# Patient Record
Sex: Male | Born: 1965 | Race: White | Hispanic: No | Marital: Married | Smoking: Never smoker
Health system: Southern US, Community
[De-identification: ages and names within clinical notes are randomized; demographics above are authoritative.]

## PROBLEM LIST (undated history)

## (undated) DIAGNOSIS — G4730 Sleep apnea, unspecified: Secondary | ICD-10-CM

## (undated) DIAGNOSIS — S0300XA Dislocation of jaw, unspecified side, initial encounter: Secondary | ICD-10-CM

## (undated) DIAGNOSIS — Z923 Personal history of irradiation: Secondary | ICD-10-CM

## (undated) DIAGNOSIS — C099 Malignant neoplasm of tonsil, unspecified: Secondary | ICD-10-CM

## (undated) DIAGNOSIS — T7840XA Allergy, unspecified, initial encounter: Secondary | ICD-10-CM

## (undated) DIAGNOSIS — F988 Other specified behavioral and emotional disorders with onset usually occurring in childhood and adolescence: Secondary | ICD-10-CM

## (undated) DIAGNOSIS — F329 Major depressive disorder, single episode, unspecified: Secondary | ICD-10-CM

## (undated) DIAGNOSIS — M5136 Other intervertebral disc degeneration, lumbar region: Secondary | ICD-10-CM

## (undated) DIAGNOSIS — M51369 Other intervertebral disc degeneration, lumbar region without mention of lumbar back pain or lower extremity pain: Secondary | ICD-10-CM

## (undated) DIAGNOSIS — R5383 Other fatigue: Secondary | ICD-10-CM

## (undated) DIAGNOSIS — C140 Malignant neoplasm of pharynx, unspecified: Secondary | ICD-10-CM

## (undated) DIAGNOSIS — J984 Other disorders of lung: Secondary | ICD-10-CM

## (undated) DIAGNOSIS — F32A Depression, unspecified: Secondary | ICD-10-CM

## (undated) HISTORY — DX: Major depressive disorder, single episode, unspecified: F32.9

## (undated) HISTORY — DX: Personal history of irradiation: Z92.3

## (undated) HISTORY — DX: Other specified behavioral and emotional disorders with onset usually occurring in childhood and adolescence: F98.8

## (undated) HISTORY — DX: Other intervertebral disc degeneration, lumbar region without mention of lumbar back pain or lower extremity pain: M51.369

## (undated) HISTORY — DX: Depression, unspecified: F32.A

## (undated) HISTORY — DX: Other intervertebral disc degeneration, lumbar region: M51.36

## (undated) HISTORY — DX: Other disorders of lung: J98.4

## (undated) HISTORY — DX: Sleep apnea, unspecified: G47.30

## (undated) HISTORY — PX: ANTERIOR RELEASE VERTEBRAL BODY W/ POSTERIOR FUSION: SUR290

## (undated) HISTORY — DX: Malignant neoplasm of tonsil, unspecified: C09.9

## (undated) HISTORY — PX: CHOLECYSTECTOMY: SHX55

## (undated) HISTORY — DX: Other fatigue: R53.83

## (undated) HISTORY — DX: Dislocation of jaw, unspecified side, initial encounter: S03.00XA

## (undated) HISTORY — DX: Allergy, unspecified, initial encounter: T78.40XA

---

## 1997-07-23 ENCOUNTER — Emergency Department (HOSPITAL_COMMUNITY): Admission: EM | Admit: 1997-07-23 | Discharge: 1997-07-23 | Payer: Self-pay | Admitting: Emergency Medicine

## 1997-11-02 ENCOUNTER — Encounter: Admission: RE | Admit: 1997-11-02 | Discharge: 1998-01-31 | Payer: Self-pay | Admitting: Anesthesiology

## 1998-01-08 ENCOUNTER — Encounter: Payer: Self-pay | Admitting: Neurosurgery

## 1998-01-08 ENCOUNTER — Ambulatory Visit (HOSPITAL_COMMUNITY): Admission: RE | Admit: 1998-01-08 | Discharge: 1998-01-08 | Payer: Self-pay | Admitting: Neurosurgery

## 1998-08-19 ENCOUNTER — Encounter: Admission: RE | Admit: 1998-08-19 | Discharge: 1998-11-17 | Payer: Self-pay | Admitting: Anesthesiology

## 1999-05-15 ENCOUNTER — Encounter: Payer: Self-pay | Admitting: Physical Medicine and Rehabilitation

## 1999-05-15 ENCOUNTER — Ambulatory Visit (HOSPITAL_COMMUNITY)
Admission: RE | Admit: 1999-05-15 | Discharge: 1999-05-15 | Payer: Self-pay | Admitting: Physical Medicine and Rehabilitation

## 1999-09-06 HISTORY — PX: BACK SURGERY: SHX140

## 1999-09-11 ENCOUNTER — Encounter: Payer: Self-pay | Admitting: Neurological Surgery

## 1999-09-11 ENCOUNTER — Inpatient Hospital Stay (HOSPITAL_COMMUNITY): Admission: RE | Admit: 1999-09-11 | Discharge: 1999-09-16 | Payer: Self-pay | Admitting: Neurological Surgery

## 1999-10-02 ENCOUNTER — Encounter: Admission: RE | Admit: 1999-10-02 | Discharge: 1999-10-02 | Payer: Self-pay | Admitting: Neurological Surgery

## 1999-10-02 ENCOUNTER — Encounter: Payer: Self-pay | Admitting: Neurological Surgery

## 1999-11-05 ENCOUNTER — Encounter: Admission: RE | Admit: 1999-11-05 | Discharge: 1999-11-05 | Payer: Self-pay | Admitting: Neurological Surgery

## 1999-11-05 ENCOUNTER — Encounter: Payer: Self-pay | Admitting: Neurological Surgery

## 1999-11-17 ENCOUNTER — Encounter: Admission: RE | Admit: 1999-11-17 | Discharge: 2000-02-15 | Payer: Self-pay | Admitting: Neurological Surgery

## 2000-02-16 ENCOUNTER — Encounter: Admission: RE | Admit: 2000-02-16 | Discharge: 2000-05-16 | Payer: Self-pay | Admitting: Neurological Surgery

## 2006-05-20 ENCOUNTER — Ambulatory Visit: Payer: Self-pay

## 2008-01-11 ENCOUNTER — Ambulatory Visit: Payer: Self-pay

## 2009-01-10 ENCOUNTER — Emergency Department (HOSPITAL_COMMUNITY): Admission: EM | Admit: 2009-01-10 | Discharge: 2009-01-11 | Payer: Self-pay | Admitting: Emergency Medicine

## 2010-03-22 LAB — BASIC METABOLIC PANEL
BUN: 9 mg/dL (ref 6–23)
Calcium: 9 mg/dL (ref 8.4–10.5)
GFR calc Af Amer: >60 mL/min (ref >60)
GFR calc non Af Amer: >60 mL/min (ref >60)
Glucose, Bld: 86 mg/dL (ref 70–99)

## 2010-03-22 LAB — CBC
MCHC: 34.5 g/dL (ref 30.0–36.0)
Platelets: 238 10*3/uL (ref 150–400)
RBC: 4.59 MIL/uL (ref 4.22–5.81)
WBC: 7.1 10*3/uL (ref 4.0–10.5)

## 2010-03-22 LAB — DIFFERENTIAL
Basophils Absolute: 0 10*3/uL (ref 0.0–0.1)
Basophils Relative: 1 % (ref 0–1)
Eosinophils Relative: 2 % (ref 0–5)
Lymphocytes Relative: 29 % (ref 12–46)
Monocytes Absolute: 0.5 10*3/uL (ref 0.1–1.0)

## 2010-05-23 NOTE — Discharge Summary (Signed)
[place]. [facility]  Patient:    Kyle Hendrix, Kyle Hendrix                    MRN: [id number] Adm. Date:  [id number] Disc. Date: [id number] Attending:  Jonne Ply                           Discharge Summary  ADMITTING DIAGNOSIS:  L4-5 spondylolisthesis with lumbar radiculopathy.  DISCHARGE DIAGNOSES: 1. L4-5 spondylolisthesis with lumbar radiculopathy. 2. Urinary tract infection.  CONDITION ON DISCHARGE:  Improving.  HOSPITAL COURSE:  The patient is a 45 year old individual who has had a spondylolisthesis at the L4-5 level.  He had a previous diskectomy at L4-5. He was found to have developed a significant slip over the ensuing year, and he was advised to go to surgical decompression and stabilization using pedicle fixation and a posterior interbody arthrodesis with Branigan cages.  The patient was taken to the operating room for this procedure on May 11, 1999. Postoperatively, he did fairly well; however, the patient developed severe problems with constipation and required the use of a Foley catheter for 48 hours.  On the day prior to discharge, he was noted to have significant complaints of discomfort both abdominally and was complaining of having cloudy urination.  Urinalysis was fairly negative; however, culture was sent and the patient was started on an antibiotic for his urinary tract infection that was presumed.  He seemed to improve rather rapidly.  His temperature was 100.5. His incision has remained clean and dry, and at this time he is discharged home with a prescription for four more days of Cipro 500 mg b.i.d., Percocet #60 without refills, and Valium 5 mg #40 without refills. DD:  09/16/99 TD:  09/16/99 Job: [id number]

## 2010-05-23 NOTE — H&P (Signed)
[place]. [facility]  Patient:    Kyle Hendrix, Kyle Hendrix                    MRN: [id number] Adm. Date:  [id number] Attending:  Jonne Ply                         History and Physical  ADMISSION DIAGNOSIS:  Spondylolisthesis L4-5 with lumbar radiculopathy.  HISTORY OF PRESENT ILLNESS:  The patient is a 45 year old right-handed invidual who works as a Naval architect.  About two years ago, he was involved in a work-related incident in September of 1999.  He developed pain in his low back after a fall onto his buttocks.  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  The pain subsequently began to radiate to his lower extremities.  He was treated with aggressive conservative management and followed by Dr. Lelon Perla.  He seemed to do well, however, after returning to the work place, he developed further symptoms of numbness and tingling in his lower extremities and more back pain.  He was seen by Dr. Gean Birchwood.  Ultimately a workup including diskograms were performed at 4-5 demonstrating a spondylolisthesis. Stenosis was also noted on the recent myelogram.  The patient was seen for a second opinion on August 15 and we discussed surgical intervention.  The patient wished to proceed with surgery at that visit and he is now admitted to undergo Gill procedure, decompression followed by stabilization of L4-5 using a posterior interbody technique and pedicle screw fixation.  PAST MEDICAL HISTORY:  The patient has been very healthy.  He does have allergies seasonally and has asthma associated with those allergies.  ALLERGIES:  IODINE, NOVACAINE, ULTRAM, AVELOX also causes allergies.  The patient does not smoke.  He drinks alcohol socially and on rare occasions. Height and weight have been stable at 5 foot 7 inches and 200 pounds.  MEDICATIONS: 1. Zoloft 25 mg a day for mild depression. 2. Darvocet. 3. Celebrex. 4. Flexoril.  FAMILY HISTORY:  His mother is  age 5 and in good health.  Father is age 87 also in good health.  No known medical illnesses.  REVIEW OF SYSTEMS:  Notable for wearing glasses for reading only.  Hay fever. Leg pain while walking.  Leg weakness.  Back pain and leg pain.  PHYSICAL EXAMINATION:  GENERAL:  An alert and oriented, well-developed individual in no acute distress.  Stands straight and erect without difficulty.  Flexion forward is possible to 45 degrees before he complains of significant back pain with radiation to both buttocks.  Palpation across the lumbar spine reproduces exquisite tenderness at the central portion of the beltline overlying the L5 spinous process.  Motor strength in the lower extremities reveals the iliopsoas, quads, tibialis, and gastrocnemius have good strength, tone, and bulk to confrontation.  Deep tendon reflexes are 2+ in the patella, 1+ in the achilles.  Babinskis are downgoing.  Sensation is intact to vibration distally.  Straight leg raising and reproduces primarily back pain to 60 degrees in either lower extremity.  Patricks maneuver is negative bilaterally.  Upper extremity strength and reflexes are normal.  Cranial nerve examination reveals the pupils are 4 mm, brisk, reactive to light and accommodation.  The extraocular movements are full.  Face is symmetric to grimace.  Tongue and uvula are in the midline.  Sclerae and conjunctivae are clear.  VITAL SIGNS:  Blood pressure 128/72, heart  rate 66 and regular, respirations 14 and nonlabored.  NECK:  No masses and no bruits are heard.  LUNGS:  Clear to auscultation.  ABDOMEN:  Soft.  Bowel sounds are positive.  No masses are palpable.  EXTREMITIES:  No clubbing, cyanosis, or edema.  IMPRESSION:  The patient has evidence of degenerative changes of spondylolisthesis at L4-5 and he is admitted to undergo surgical decompression and stabilization. DD:  09/11/99 TD:  09/11/99 Job: [id number]

## 2010-05-23 NOTE — Op Note (Signed)
[place]. [facility]  Patient:    Kyle Hendrix, Kyle Hendrix                    MRN: [id number] Proc. Date: 09/11/99 Adm. Date:  [id number] Attending:  Jonne Ply                           Operative Report  PREOPERATIVE DIAGNOSIS:  L4-5 spondylolisthesis with lumbar radiculopathy.  POSTOPERATIVE DIAGNOSIS:  L4-5 spondylolisthesis with lumbar radiculopathy.  OPERATION PERFORMED:  L4 Gill procedure.  Posterior interbody arthrodesis with Brantigan cage technique pedicle screw fixation and arthrodesis with local autograft.  SURGEON:  Stefani Dama, M.D.  ASSISTANT:  Cristi Loron, M.D.  ANESTHESIA:  General endotracheal  INDICATIONS FOR PROCEDURE:  The patient is a 45 year old individual who has had significant back pain, bilateral radiculopathy.  He has a spondylolisthesis at L4-5.  DESCRIPTION OF PROCEDURE:  The patient was brought to the operating room supine on the stretcher.  After smooth induction of general endotracheal anesthesia, he was turned prone.  The back was shaved, prepped with Hibiclens and then alcohol and then draped in a sterile fashion.  A midline incision was created and carried down to the lumbodorsal fascia which was opened on either side of the midline.  The L4 laminar arch and spinous process was noted to be loose and was identified positively with a radiograph.  Dissection was carried out laterally, so that a Gill procedure so that an en bloc removal of the L4 spinous process and laminar arch was performed.  This bone was then cleaned, morselized and used for local autograft later.  It was mixed with Vitoss bone void filler which was then mixed with some of the patients own blood.  The bone was laid aside for later use.  The decompression was then completed using a combination of curets and rongeurs to remove the redundant yellow ligament which was noted to be markedly thickened at the L4-L5 interspace.  The L5 facets  were normal in appearance.  However, there was a moderate amount of grumous material that had formed around the pars defect.  This was also decompressed.  The L4 nerve root was decompressed superiorly.  The L5 nerve root was decompressed inferiorly. This was done on both sides.  The diskectomy was then performed using a combination of curets and rongeurs to remove a significant amount of degenerated material from within each side of the disk space.  The endplates were then identified, decorticated with a combination of curets and osteotomes specifically designed for this purpose.  A trial cage spacer was placed.  This measured 9 x 11 mm on the right side and seemed to adequately distract the interspace.  Brannigan cages were then placed measuring 9 x 11 mm and being 25 mm in length.  They were packed with the patients local autologous bone and were packed in both sides.  Pedicle screw entry sites were then chosen at L4 and L5.  These were then sounded and 5.75 x 45 mm screws were placed after tapping individual holes.  Each hole was checked for possibility of cut out using a probe.  None was identified.  The screws were then placed to the appropriate height and then an 80 mm straight rod was cut in half and bent to the appropiate contour.  The spiral 90 locking caps were then placed over the screws and the screws were locked into their final  position using a slight bit of compression across the construct.  Once these were locked into position, the patency of the foraminal openings for the L4 and the L5 nerve roots were checked.  A fat graft was placed over the L4 nerve root and region of the axilla after packing the interspace with additional bone and Vitoss mixture and then after placing the fat graft, additional graft was placed laterally from under the pedicular construct. This was done on either side.  Once this was completed, the area was copiously irrigated with antibiotic irrigating  solution.  The retractor was removed from the wound and then the wound was closed using #1 Vicryl in interrupted fashion in the lumbodorsal fascia, 2-0 Vicryl in the subcutaneous tissues and 3-0 Vicryl subcuticularly.  A clear plastic dressing was placed on the back.  The patient tolerated the procedure well.  Blood loss was estimated around 450 cc. One unit of Cell Saver blood was returned to the patient. DD:  09/11/99 TD:  09/13/99 Job: [id number]

## 2011-02-05 ENCOUNTER — Other Ambulatory Visit (HOSPITAL_COMMUNITY)
Admission: RE | Admit: 2011-02-05 | Discharge: 2011-02-05 | Disposition: A | Discharge status: Home / Self Care | Payer: Self-pay | Source: Ambulatory Visit | Attending: Otolaryngology | Admitting: Otolaryngology

## 2011-02-05 ENCOUNTER — Other Ambulatory Visit: Payer: Self-pay | Admitting: Otolaryngology

## 2011-02-05 DIAGNOSIS — C099 Malignant neoplasm of tonsil, unspecified: Secondary | ICD-10-CM

## 2011-02-05 DIAGNOSIS — R220 Localized swelling, mass and lump, head: Secondary | ICD-10-CM | POA: Insufficient documentation

## 2011-02-05 HISTORY — DX: Malignant neoplasm of tonsil, unspecified: C09.9

## 2011-02-16 ENCOUNTER — Other Ambulatory Visit (HOSPITAL_COMMUNITY): Payer: Self-pay | Admitting: Otolaryngology

## 2011-02-16 DIAGNOSIS — C770 Secondary and unspecified malignant neoplasm of lymph nodes of head, face and neck: Secondary | ICD-10-CM

## 2011-02-25 ENCOUNTER — Encounter (HOSPITAL_COMMUNITY)
Admission: RE | Admit: 2011-02-25 | Discharge: 2011-02-25 | Disposition: A | Discharge status: Home / Self Care | Payer: BC Managed Care – PPO | Source: Ambulatory Visit | Attending: Otolaryngology | Admitting: Otolaryngology

## 2011-02-25 ENCOUNTER — Other Ambulatory Visit (HOSPITAL_COMMUNITY): Payer: Self-pay | Admitting: Otolaryngology

## 2011-02-25 DIAGNOSIS — C770 Secondary and unspecified malignant neoplasm of lymph nodes of head, face and neck: Secondary | ICD-10-CM

## 2011-02-25 DIAGNOSIS — N200 Calculus of kidney: Secondary | ICD-10-CM | POA: Insufficient documentation

## 2011-02-25 DIAGNOSIS — R599 Enlarged lymph nodes, unspecified: Secondary | ICD-10-CM | POA: Insufficient documentation

## 2011-02-25 DIAGNOSIS — D490 Neoplasm of unspecified behavior of digestive system: Secondary | ICD-10-CM | POA: Insufficient documentation

## 2011-02-25 DIAGNOSIS — K7689 Other specified diseases of liver: Secondary | ICD-10-CM | POA: Insufficient documentation

## 2011-02-25 LAB — GLUCOSE, CAPILLARY: Glucose-Capillary: 101 mg/dL — ABNORMAL HIGH (ref 70–99)

## 2011-02-25 MED ORDER — FLUDEOXYGLUCOSE F - 18 (FDG) INJECTION
17.2000 | Freq: Once | INTRAVENOUS | Status: AC | PRN
  Administered 2011-02-25: 17.2 via INTRAVENOUS

## 2011-03-02 ENCOUNTER — Telehealth: Payer: Self-pay | Admitting: Oncology

## 2011-03-02 ENCOUNTER — Other Ambulatory Visit: Payer: Self-pay | Admitting: Oncology

## 2011-03-02 DIAGNOSIS — C099 Malignant neoplasm of tonsil, unspecified: Secondary | ICD-10-CM

## 2011-03-02 NOTE — Telephone Encounter (Signed)
called pt and scheduled appt for 02/27.  will fax over  a letter to Dr. Lazarus Salines with appt d/t

## 2011-03-03 ENCOUNTER — Encounter: Payer: Self-pay | Admitting: *Deleted

## 2011-03-03 ENCOUNTER — Telehealth: Payer: Self-pay | Admitting: Oncology

## 2011-03-03 DIAGNOSIS — F988 Other specified behavioral and emotional disorders with onset usually occurring in childhood and adolescence: Secondary | ICD-10-CM | POA: Insufficient documentation

## 2011-03-03 DIAGNOSIS — G4730 Sleep apnea, unspecified: Secondary | ICD-10-CM | POA: Insufficient documentation

## 2011-03-03 NOTE — Telephone Encounter (Signed)
Referred by Dr. Lazarus Salines Dx- [facility]

## 2011-03-04 ENCOUNTER — Ambulatory Visit (HOSPITAL_BASED_OUTPATIENT_CLINIC_OR_DEPARTMENT_OTHER): Payer: BC Managed Care – PPO | Admitting: Oncology

## 2011-03-04 ENCOUNTER — Encounter: Payer: Self-pay | Admitting: Radiation Oncology

## 2011-03-04 ENCOUNTER — Ambulatory Visit
Admission: RE | Admit: 2011-03-04 | Discharge: 2011-03-04 | Disposition: A | Discharge status: Home / Self Care | Payer: BC Managed Care – PPO | Source: Ambulatory Visit | Attending: Radiation Oncology | Admitting: Radiation Oncology

## 2011-03-04 ENCOUNTER — Ambulatory Visit: Payer: BC Managed Care – PPO

## 2011-03-04 ENCOUNTER — Encounter: Payer: Self-pay | Admitting: *Deleted

## 2011-03-04 ENCOUNTER — Telehealth: Payer: Self-pay | Admitting: Oncology

## 2011-03-04 ENCOUNTER — Other Ambulatory Visit: Payer: BC Managed Care – PPO

## 2011-03-04 ENCOUNTER — Encounter: Payer: Self-pay | Admitting: Oncology

## 2011-03-04 VITALS — BP 121/85 | HR 123 | Temp 97.8°F | Ht 68.0 in | Wt 231.0 lb

## 2011-03-04 DIAGNOSIS — Z79899 Other long term (current) drug therapy: Secondary | ICD-10-CM | POA: Insufficient documentation

## 2011-03-04 DIAGNOSIS — L988 Other specified disorders of the skin and subcutaneous tissue: Secondary | ICD-10-CM | POA: Insufficient documentation

## 2011-03-04 DIAGNOSIS — C099 Malignant neoplasm of tonsil, unspecified: Secondary | ICD-10-CM

## 2011-03-04 DIAGNOSIS — C801 Malignant (primary) neoplasm, unspecified: Secondary | ICD-10-CM

## 2011-03-04 DIAGNOSIS — K121 Other forms of stomatitis: Secondary | ICD-10-CM | POA: Insufficient documentation

## 2011-03-04 DIAGNOSIS — B379 Candidiasis, unspecified: Secondary | ICD-10-CM | POA: Insufficient documentation

## 2011-03-04 DIAGNOSIS — Z87891 Personal history of nicotine dependence: Secondary | ICD-10-CM | POA: Insufficient documentation

## 2011-03-04 DIAGNOSIS — D696 Thrombocytopenia, unspecified: Secondary | ICD-10-CM | POA: Insufficient documentation

## 2011-03-04 DIAGNOSIS — R110 Nausea: Secondary | ICD-10-CM | POA: Insufficient documentation

## 2011-03-04 DIAGNOSIS — D709 Neutropenia, unspecified: Secondary | ICD-10-CM | POA: Insufficient documentation

## 2011-03-04 DIAGNOSIS — Z91041 Radiographic dye allergy status: Secondary | ICD-10-CM | POA: Insufficient documentation

## 2011-03-04 DIAGNOSIS — K1230 Oral mucositis (ulcerative), unspecified: Secondary | ICD-10-CM | POA: Insufficient documentation

## 2011-03-04 DIAGNOSIS — Z510 Encounter for antineoplastic radiation therapy: Secondary | ICD-10-CM | POA: Insufficient documentation

## 2011-03-04 DIAGNOSIS — R599 Enlarged lymph nodes, unspecified: Secondary | ICD-10-CM | POA: Insufficient documentation

## 2011-03-04 DIAGNOSIS — T7840XA Allergy, unspecified, initial encounter: Secondary | ICD-10-CM | POA: Insufficient documentation

## 2011-03-04 DIAGNOSIS — G4730 Sleep apnea, unspecified: Secondary | ICD-10-CM | POA: Insufficient documentation

## 2011-03-04 DIAGNOSIS — B977 Papillomavirus as the cause of diseases classified elsewhere: Secondary | ICD-10-CM | POA: Insufficient documentation

## 2011-03-04 DIAGNOSIS — Z931 Gastrostomy status: Secondary | ICD-10-CM | POA: Insufficient documentation

## 2011-03-04 LAB — CBC WITH DIFFERENTIAL/PLATELET
BASO%: 0.4 % (ref 0.0–2.0)
MCH: 30 pg (ref 27.2–33.4)
MCHC: 34.3 g/dL (ref 32.0–36.0)
MCV: 87.5 fL (ref 79.3–98.0)
RBC: 5.11 10*6/uL (ref 4.20–5.82)
RDW: 12.7 % (ref 11.0–14.6)
lymph#: 2.1 10*3/uL (ref 0.9–3.3)

## 2011-03-04 LAB — COMPREHENSIVE METABOLIC PANEL
ALT: 43 U/L (ref 0–53)
BUN: 12 mg/dL (ref 6–23)
CO2: 25 mEq/L (ref 19–32)
Calcium: 9.7 mg/dL (ref 8.4–10.5)
Chloride: 103 mEq/L (ref 96–112)
Creatinine, Ser: 0.92 mg/dL (ref 0.50–1.35)
Total Bilirubin: 0.6 mg/dL (ref 0.3–1.2)

## 2011-03-04 MED ORDER — LARYNGOSCOPY SOLUTION RAD-ONC
15.0000 mL | Freq: Once | TOPICAL | Status: AC
  Administered 2011-03-04: 15 mL via TOPICAL
  Filled 2011-03-04: qty 15

## 2011-03-04 MED ORDER — ONDANSETRON HCL 8 MG PO TABS: ORAL_TABLET | ORAL | Status: DC

## 2011-03-04 MED ORDER — LORAZEPAM 0.5 MG PO TABS: 0.5000 mg | ORAL_TABLET | Freq: Four times a day (QID) | ORAL | Status: DC | PRN

## 2011-03-04 MED ORDER — PROCHLORPERAZINE MALEATE 10 MG PO TABS: 10.0000 mg | ORAL_TABLET | Freq: Four times a day (QID) | ORAL | Status: DC | PRN

## 2011-03-04 NOTE — Progress Notes (Signed)
Faxed Referral for Speech Therapy to [facility] at fax [phone],  Per Dr. Lodema Pilot request.

## 2011-03-04 NOTE — Telephone Encounter (Signed)
appts made and printed for pa aom

## 2011-03-04 NOTE — Progress Notes (Signed)
Patient came in today as a new patient,he has [employer] but he has some financial question,so I gave him an Programmer, systems to fill out.

## 2011-03-04 NOTE — Progress Notes (Signed)
Please see the Nurse Progress Note in the MD Initial Consult Encounter for this patient. 

## 2011-03-04 NOTE — Progress Notes (Signed)
Married, Lexicographer, works part-time at [employer], to begin part -time w/[employer]   Pt states he has mild dry mouth in mornings, hoarseness, no pain

## 2011-03-04 NOTE — Progress Notes (Signed)
[facility] MEDICAL ONCOLOGY - INITIAL CONSULATION  Referral MD:  Dr. Flo Shanks, M.D.   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   HPI: Kyle Hendrix is a 46 yo Caucasian man with history of chewing tobacco which he quit 12 years ago. He was in usual state of health until 8 weeks prior to presentation when he developed a pop in his right jaw. First he thought this was TMJ. However for the day he was able to feel a lymph node enlarging from the size of a quarter to a size of a small lemon.  He presented to his PCP in the next day and was thought to have parotid gland infection and was given a course of Augmentin without improvement of his adenopathy. He therefore referred himself to Dr. Lazarus Salines on 02/05/2011 and underwent a fine-needle aspiration of this right prostate enlargement. Pathology case number [id number] was consistent with squamous cell carcinoma. There was not enough tissue to perform HPV testing. He underwent on 02/25/2011 a PET scan which I reviewed myself personally and showed the images to the patient and his family members which showed uptake in the right tonsil additions to necrotic right level II node measured 3.7 cm. There was no evidence of widely metastatic disease. There were some isolated left axilla and right mediastinal activities but not pathology enlarged. These were not felt to be metastatic disease. He was kindly referred to the [facility] for evaluation of treatment options.   Kyle Hendrix presented to the first time to the cancer center with his mother and his wife.  His right neck adenopathy has been stable and has not caused him any pain. There is no skin opening, erythema, or purulent discharge. He has mild fatigue however he has been fatigue for many years. 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 He does not have any problem with dysphagia odynophagia. He had  history of exposure to loud music and possibly tinnitus and 20% decrease in his hearing capacity in the left ear. He has chronic skin rash in the bilateral elbows been on for many years and this is not getting worse compared to before.   Patient denies visual changes, confusion, drenching night sweats,  mucositis, odynophagia, dysphagia, nausea vomiting, jaundice, chest pain, palpitation, shortness of breath, dyspnea on exertion, productive cough, gum bleeding, epistaxis, hematemesis, hemoptysis, abdominal pain, abdominal swelling, early satiety, melena, hematochezia, hematuria, skin rash, spontaneous bleeding, joint swelling, joint pain, heat or cold intolerance, bowel bladder incontinence, back pain, focal motor weakness, paresthesia, depression, suicidal or homocidal ideation, feeling hopelessness.     Past Medical History  Diagnosis Date  . Nasal polyps   . Sleep apnea   . 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 Depression   . Fatigue   . Degeneration of lumbar intervertebral disc   . Allergy     seasonal  . Asthma     seasonal  . 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 TMJ (dislocation of temporomandibular joint)   :  Past Surgical History  Procedure Date  . Anterior release vertebral body w/ posterior fusion     lumbar  . Back surgery 09/1999    L4-5 fusion  :  Current Outpatient Prescriptions  Medication Sig Dispense Refill  . lisdexamfetamine (VYVANSE) 70 MG capsule Take 70 mg by mouth every morning.      Marland Kitchen  sertraline (ZOLOFT) 100 MG tablet Take 100 mg by mouth daily.      . ARIPiprazole (ABILIFY) 10 MG tablet Take 10 mg by mouth daily.      Marland Kitchen LORazepam (ATIVAN) 0.5 MG tablet Take 1 tablet (0.5 mg total) by mouth every 6 (six) hours as needed (Nausea or vomiting).  30 tablet  0  . ondansetron (ZOFRAN) 8 MG tablet Take 1 tablet two times a day as needed for nausea or vomiting starting on the third day after chemotherapy.  30  tablet  1  . prochlorperazine (COMPAZINE) 10 MG tablet Take 1 tablet (10 mg total) by mouth every 6 (six) hours as needed (Nausea or vomiting).  30 tablet  1   No current facility-administered medications for this visit.   Facility-Administered Medications Ordered in Other Visits  Medication Dose Route Frequency Provider Last Rate Last Dose  . laryngocopy solution for Rad-Onc  15 mL Topical Once Jonna Coup, MD   15 mL at 03/04/11 0900     Allergies  Allergen Reactions  . Iodine Anaphylaxis  . Shellfish Allergy Anaphylaxis  . Avelox (Moxifloxacin Hcl In Nacl) Hives  . Morphine And Related Hives  . Prednisone Other (See Comments)    Altered mental status  . Ultram (Tramadol Hcl) Hives  :  Family History  Problem Relation Age of Onset  . Diabetes Mother   . Hypertension Mother   . Stroke Maternal Grandfather   :  History   Social History  . Marital Status: Single    Spouse Name: N/A    Number of Children: 2  . Years of Education: N/A   Occupational History  .  [employer]    now MRI tech at [employer]    Social History Main Topics  . Smoking status: Never Smoker   . Smokeless tobacco: Former Neurosurgeon    Types: Chew    Quit date: 03/04/1999  . Alcohol Use: Yes     socially  . Drug Use: Yes     high school , college  . Sexually Active:    Other Topics Concern  . Not on file   Social History Narrative  . No narrative on file    Pertinent items are noted in HPI.  Exam: ECOG 0.   General:  well-nourished in no acute distress.  Eyes:  no scleral icterus.  ENT:  There were no oropharyngeal lesions on my unaided exam.  Neck was without thyromegaly.  Lymphatics: positive for about 3x3cm right level II node.  There was no supraclavicular or axillary adenopathy.  Respiratory: lungs were clear bilaterally without wheezing or crackles.  Cardiovascular:  Regular rate and rhythm, S1/S2, without murmur, rub or gallop.  There was no pedal edema.  GI:  abdomen was soft,  flat, nontender, nondistended, without organomegaly.  Muscoloskeletal:  no spinal tenderness of palpation of vertebral spine.  Skin exam was without echymosis, petichae.  Neuro exam was nonfocal.  Patient was able to get on and off exam table without assistance.  Gait was normal.  Patient was alerted and oriented.  Attention was good.   Language was appropriate.  Mood was normal without depression.  Speech was not pressured.  Thought content was not tangential.     Lab Results  Component Value Date   WBC 8.4 03/04/2011   HGB 15.3 03/04/2011   HCT 44.7 03/04/2011   PLT 265 03/04/2011   GLUCOSE 90 03/04/2011   ALT 43 03/04/2011   AST 30 03/04/2011  NA 141 03/04/2011   K 4.2 03/04/2011   CL 103 03/04/2011   CREATININE 0.92 03/04/2011   BUN 12 03/04/2011   CO2 25 03/04/2011    Nm Pet Image Initial (pi) Skull Base To Thigh  02/25/2011  *RADIOLOGY REPORT*  Clinical Data: Initial treatment strategy for recent biopsy of the right parotid tumor. Per discussion with clinical office, nondiagnostic.  NUCLEAR MEDICINE PET CT SKULL BASE TO THIGH  Technique:  17.2 mCi F-18 FDG was injected intravenously via the right AC.  Full-ring PET imaging was performed from the skull base through the mid-thighs 66  minutes after injection.  CT data was obtained and used for attenuation correction and anatomic localization only.  (This was not acquired as a diagnostic CT examination.)  Fasting Blood Glucose:  101  Patient Weight:  230 pounds.  Comparison: Chest film of 01/10/2009.  No prior CT or MRI.  Findings: PET images demonstrate asymmetric hypermetabolism about the oral pharynx at the level of the palatine tonsils.  This is greater on the right than left, measures a S.U.V. max of 16.8. This is concurrent with soft tissue thickening on image 47 of series 2.  A centrally necrotic node or conglomerate of nodes within the right level IIA station measures 3.7 x 3.1 cm and a S.U.V. max of 11.9 on image 50.  Is felt to separate from  and deep to the right parotid gland. Immediately posterior right-sided node measures 5 mm and a S.U.V. max of 3.9 on image 48 of series 2.  Small left sided jugular nodes.  An index left sided level IIA node measures 7 mm and a S.U.V. max of 2.3 on image 51.  A left axillary node which measures 7 mm and a S.U.V. max of 2.5 on image 63 of series 2. High right paratracheal node which measures 9 mm and a S.U.V. max of 3.7 on image 61 of series 2.  No abnormal activity within the abdomen or pelvis.  CT images performed for attenuation correction demonstrate no further findings within the neck. Mild ethmoid air cell mucosal thickening.  Mild hepatic steatosis.  Upper pole left renal calculus, nonobstructive.  Partial fusion of the bilateral sacroiliac joints.  Prior lumbar spine fixation.  IMPRESSION:  1.  Necrotic hypermetabolic right-sided level IIA nodal mass.  Most consistent with metastatic disease. 2.  This most likely arises from a right-sided oral pharyngeal primary with asymmetric soft tissue thickening and hypermetabolism at this level. 3.  Small left sided jugular chain nodes which are mildly hypermetabolic and favored to be reactive. 4.  Isolated left axillary and high right mediastinal hypermetabolic but not pathologically enlarged nodes. The left axillary node is not in the expected drainage pattern of a head and neck primary and is favored to be reactive.  Mediastinal node is equivocal. Recommend attention on follow-up.  Original Report Authenticated By: Consuello Bossier, M.D.    Assessment and Plan:   1.  History of chewing tobacco:  I stressed with patient the risk of chewing tobacco and developing HNSCC.  He has been off of tobacco for more than 10 years.   2.  Newly diagnosed cT1 N2a M0 right tonsil squamous cell carcinoma:  STAGING:  There were some medistinal node with slight uptake but not pathologically enlarged.  I have very low clinical suspicion for widespread metastatic disease.    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  We discussed  the prognostic information for patients with HPV-positive head and neck cancer.  In patients who were never smoker it is a good prognostic marker.  I recommended patient undergoing direct laryngoscopy by Dr. Lazarus Salines to ensure that he does not have any disease anywhere else beside the right tonsillar fossa in addition to send the repeat tissue biopsy for HPV testing.  HPV was not be able to be performed on the original FNA slide.  TREATMENT OPTIONS:  I discussed with Kyle Hendrix and his family members that the best option is concurrent chemoradiotherapy which has a chance of cure in about 70%.  Salvage resection is reserved for patients who have residual cancer after concurrent definitive chemoradiation.  Another option is upfront resection using robotic surgical technique however he will still need adjuvant radiation therapy and possibly even adjuvant chemotherapy if he has high risk features on neck dissection.  The patient and his family would like to proceed with definitive concurrent chemotherapy ration therapy up front.  We therefore discussed options for chemotherapy including cisplatin, combination of carboplatin/Taxol, Erbitux.  I discussed with patient side effects of cisplatin which include but not limited to nausea vomiting, alopecia, fatigue, mucositis, ototoxicity, nephrotoxicity, abnormal electrolytes, cytopenia, risk of bleeding and infection. I discussed the side effects of combination regimen of Carboplatin andTaxol which include but not limited to mucositis, cytopenia, infection, infusion reaction, neuropathy, myalgia, arthralgia, pneumonitis. I discussed with patient side effects of Erbitux which include but not limited to skin rash, electrolytes abnormality, infusion reaction, diarrhea.  We also discussed in detail the clinical trial RTOG 1016 which randomizes  patients with oropharynx cancer that is HPV positive to radiation therapy plus cisplatin versus ration therapy plus cetuximab.  At this time cisplatin is still standard of care. However in patients whose HPV positive oropharynx cancer, we may conclude in the future that we may be over treating these patients with cis-platinum. Therefore this trial is looking at the role of Erbitux as a substitute for cis-platinum. However cisplatin is still the standard of care at this time until further data is available.    Given history of chewing tobacco, the patient is concerned that maybe Erbitux would under treat his cancer.  He would like to proceed with standard of care which is cis-platinum once every 3 weeks.  To ascertain response will repeat a PET scan 3 months after the finish of radiation.  In preparation for therapy, he has already seen radiation oncology today. I personally contacted Dr. Kristin Bruins to request a appointment with dental medicine this week.  I contacted Dr. Raye Sorrow audiology office for a baseline hearing test this week as well.  I referred him to chemotherapy plus with the next 2 weeks. He had been referred to IR for PEG tube placement already.  He given prescriptions and instructions for Compazine/Zofran/Ativan when necessary nausea vomiting I will see him and his members in about 2 weeks to go over the last minute questions and 5 treatment options.    The length of time of the face-to-face encounter was . More than 50% of time was spent counseling and coordination of care.

## 2011-03-05 ENCOUNTER — Other Ambulatory Visit: Payer: BC Managed Care – PPO

## 2011-03-05 ENCOUNTER — Encounter (HOSPITAL_COMMUNITY): Payer: Self-pay | Admitting: Dentistry

## 2011-03-05 ENCOUNTER — Telehealth: Payer: Self-pay | Admitting: Oncology

## 2011-03-05 ENCOUNTER — Other Ambulatory Visit (HOSPITAL_COMMUNITY): Payer: Self-pay | Admitting: Family Medicine

## 2011-03-05 ENCOUNTER — Encounter: Payer: Self-pay | Admitting: Oncology

## 2011-03-05 ENCOUNTER — Encounter: Payer: Self-pay | Admitting: *Deleted

## 2011-03-05 ENCOUNTER — Ambulatory Visit (HOSPITAL_COMMUNITY): Payer: Self-pay | Admitting: Dentistry

## 2011-03-05 DIAGNOSIS — K089 Disorder of teeth and supporting structures, unspecified: Secondary | ICD-10-CM

## 2011-03-05 DIAGNOSIS — C099 Malignant neoplasm of tonsil, unspecified: Secondary | ICD-10-CM

## 2011-03-05 DIAGNOSIS — K036 Deposits [accretions] on teeth: Secondary | ICD-10-CM

## 2011-03-05 DIAGNOSIS — Z0189 Encounter for other specified special examinations: Secondary | ICD-10-CM

## 2011-03-05 DIAGNOSIS — K08109 Complete loss of teeth, unspecified cause, unspecified class: Secondary | ICD-10-CM

## 2011-03-05 NOTE — Telephone Encounter (Signed)
From 2/28 appt pt aware that we were adding tx and wil p.u sch later  aom

## 2011-03-05 NOTE — Progress Notes (Signed)
CC:   Karol T. Lazarus Salines, M.D. Exie Parody, M.D. Cindra Eves, D.D.S.  REFERRING PHYSICIAN:  Zola Button T. Lazarus Salines, M.D.  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  HISTORY OF PRESENT ILLNESS:  The patient is a 46 year old male who has a history of use of chewing tobacco, although he quit 12 years ago.  The patient states that he noticed a right neck mass approximately 2 months ago.  The patient 1st noticed this, he said, after he sneezed and felt some pain in the upper right neck.  He felt that this enlarged over a short period of time as well and he has sought medical attention for this.  The patient was given a course of Augmentin but the right neck mass did not improve and therefore he subsequently saw Dr. Lazarus Salines, who proceeded with a fine needle aspiration of what was felt to be possible right parotid enlargement.  The pathology from this demonstrated squamous cell carcinoma.  Mr. Deskins has then proceeded with a PET scan on 02/25/2011.  Asymmetric hypermetabolism was seen within the oropharynx at the level of the palatine tonsils, greater on the right, with a maximum SUV of 16.8.  There was concurrent soft-tissue thickening associated with this as well.  Additionally, there was a centrally necrotic node or conglomeration of nodes within the right level 2A station measuring 3.7 x 3.1 cm with a maximum SUV of 11.9.  This was felt to be separate from and deep to the parotid gland.  Immediately posterior to this measured a 5 mm node with a maximum SUV of 3.9.  There were also small left-sided jugular nodes with an index node measuring 7 mm in level 2A, and this had a maximum SUV of 2.3.  There was also a left axillary node which was 7 mm with a maximum SUV of 2.5 and a high paratracheal node measuring 9 mm with an SUV maximum of 3.7.  It was felt that the distant nodes in the axilla and mediastinal region were likely reactive and the small  left-sided nodes were mildly hypermetabolic, and this was also felt to be reactive.  However, the patient's dominant findings were consistent with what was felt to be most likely a right-sided oropharyngeal primary with some asymmetric soft tissue thickening and a right-sided lymphadenopathy which was involved, it was felt most likely, with carcinoma.  The patient, therefore, has presented today for discussion and evaluation for a possible course of radiation treatment, and he is also seeing Dr. Gaylyn Rong today as well.  The patient is doing well other than as above.  He really denies any significant pain, dysphagia or odynophagia, and he has not lost any weight.  He is accompanied today by his wife, and he works as an Lexicographer.  The patient is being set up to see Dr. Kristin Bruins, he states, and he also has discussed with Dr. Lazarus Salines proceeding with an exam under anesthesia with further biopsy of what likely is felt to represent a right-sided tonsillar primary carcinoma.  PAST MEDICAL HISTORY:  Nasal polyps, sleep apnea, depression, attention deficit disorder, seasonal allergies, TMJ, history of back surgery.  CURRENT MEDICATIONS:  Abilify, Vyvanse, Ativan, Zoloft.  ALLERGIES:  Iodine, shellfish, Avelox, morphine and related; prednisone has caused him altered mental status; and Ultram.  FAMILY HISTORY:  The patient denies any known family history of malignancy within his immediate family.  SOCIAL HISTORY:  The patient is married and works as an Lexicographer. He  denies any smoking but he does have a history as noted above of using smokeless tobacco.  The patient drinks alcohol on an occasional basis.  REVIEW OF SYSTEMS:  Negative other than as above and fully documented in the medical chart.  PHYSICAL EXAMINATION:  Weight 233 pounds, blood pressure 141/92, pulse 121, respiratory rate 20, temperature 97.9.  General:  Well-developed male in no acute distress.  Alert and oriented x3.   HEENT: Normocephalic, atraumatic.  Pupils equal, round, and reactive to light. Extraocular movements intact.  Oral cavity clear, and visualized oropharynx clear.  Neck:  Positive for an approximate 3 cm right level 2 node or conglomeration of smaller nodes.  There is no supraclavicular adenopathy and no palpable lymphadenopathy on the left.  Cardiovascular: Regular rate and rhythm.  Respiratory:  Clear to auscultation.  GI: Abdomen is soft, nontender, normal bowel sounds.  Extremities:  No edema present.  Neurologic:  No focal deficits.  Fiberoptic exam:  After the use of topical anesthetic, a fiberoptic scope was passed through the right naris.  Good visualization was obtained of the nasopharynx, oropharynx and laryngeal region.  No extension of tumor to the base of tongue region.  I really could not appreciate any clear primary.  On visualization of the oral exam, there appeared to be some degree of possible fullness on the right within the tonsil but no discrete mass from what I could appreciate.  IMPRESSION/PLAN:  Mr. Bhat is a pleasant 46 year old male who appears to be presenting with a locally-advanced right tonsillar primary, cT1 cN2b, stage IVA.  I believe that the patient would be an appropriate candidate for a course of chemoradiotherapy, and we discussed for some time the details of an anticipated 6-1/2-week course of radiation treatment.  He is also seeing Dr. Gaylyn Rong later today to discuss possible systemic treatment.  We also discussed a number of additional issues as well, including the need for him to see Dr. Kristin Bruins, which is being set up, and an exam under anesthesia with biopsy through Dr. Lazarus Salines.  This may help hopefully to clarify whether this is an human papilloma virus tumor, which would portend an improved prognosis stage for stage.  I also did discuss the issue of possible feeding tube and, in fact, the patient brought this up and is very interested  in proceeding with this prior to treatment.  He has a friend who I believe has a neighbor who has been going through had neck cancer, and he therefore is somewhat familiar with what is entailed and this patient needed one early in his course of treatment, which clearly had an effect on the patient.  I did discuss with him the possible pros and cons of placing a feeding tube prior to treatment.  He is aware that people use this to a variable degree but at the end of this conversation, we discussed that I would go ahead and put an order in for this to be placed by interventional radiology.  We also did discuss that I would like him to be seen for a speech and swallowing evaluation pretreatment. All of his questions were answered and the patient is interested in proceeding with the above plan, and I will await further information from Dr. Kristin Bruins to schedule the patient for simulation for his upcoming course of head and neck cancer treatment.  I do anticipate treating the patient for 6-1/2 weeks on our tomotherapy unit using in IMRT technique.    ______________________________ Radene Gunning, M.D., Ph.D. JSM/MEDQ  D:  03/04/2011  T:  03/05/2011  Job:  [id number]

## 2011-03-05 NOTE — Progress Notes (Signed)
Patient approve for 100% Discount  03/05/11 - 09/02/11. 

## 2011-03-05 NOTE — Progress Notes (Signed)
DENTAL CONSULTATION  Date of Consultation:  03/05/2011 Patient Name:   Kyle Hendrix Date of Birth:   05/13/1965 Medical Record Number: [id number]  VITALS: BP 128/74  Pulse 103  Temp(Src) 98.1 F (36.7 C) (Oral)   REASON FOR CONSULTATION: Patient needs a pre-chemoradiation therapy dental evaluation.  HPI: Kyle Hendrix is a 46 year old male with squamous cell carcinoma of the right tonsil. Patient with anticipated chemoradiation therapy. Patient is now be seen as part of a pre-chemoradiation therapy dental protocol evaluation.  Patient currently denies having any acute toothache, swellings, or abscesses. Patient was last seen in September or October 2012 for an exam and cleaning. Patient denies having any unmet dental needs. Patient indicates that he sees Dr. Darnelle Bos for his regular dental care has been seeing him over the past 8-10 years. Patient is a history of having orthodontic therapy from the age of 50 through 85 with Dr. Vanice Sarah. Patient also has a history of having previous temporomandibular joint dysfunction and previous maxillary splint therapy. Patient hasn't not worn the splint for the past 2-3 years and denies having any active acute TMJ symptoms.   PMH: Past Medical History  Diagnosis Date  . Nasal polyps   . Sleep apnea   . 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 Depression   . Fatigue   . Degeneration of lumbar intervertebral disc   . Allergy     seasonal  . Asthma     seasonal  . 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 TMJ (dislocation of temporomandibular joint)     PSH: Past Surgical History  Procedure Date  . Anterior release vertebral body w/ posterior fusion     lumbar  . Back surgery 09/1999    L4-5 fusion    ALLERGIES: Allergies  Allergen Reactions  . Iodine Anaphylaxis  . Shellfish Allergy Anaphylaxis  . Avelox (Moxifloxacin Hcl In Nacl) Hives  . Morphine And Related Hives  . Prednisone  Other (See Comments)    Altered mental status  . Ultram (Tramadol Hcl) Hives    MEDICATIONS: Current Outpatient Prescriptions  Medication Sig Dispense Refill  . ARIPiprazole (ABILIFY) 10 MG tablet Take 10 mg by mouth daily.      Marland Kitchen lisdexamfetamine (VYVANSE) 70 MG capsule Take 70 mg by mouth every morning.      Marland Kitchen LORazepam (ATIVAN) 0.5 MG tablet Take 1 tablet (0.5 mg total) by mouth every 6 (six) hours as needed (Nausea or vomiting).  30 tablet  0  . ondansetron (ZOFRAN) 8 MG tablet Take 1 tablet two times a day as needed for nausea or vomiting starting on the third day after chemotherapy.  30 tablet  1  . prochlorperazine (COMPAZINE) 10 MG tablet Take 1 tablet (10 mg total) by mouth every 6 (six) hours as needed (Nausea or vomiting).  30 tablet  1  . sertraline (ZOLOFT) 100 MG tablet Take 100 mg by mouth daily.        LABS: Lab Results  Component Value Date   WBC 8.4 03/04/2011   HGB 15.3 03/04/2011   HCT 44.7 03/04/2011   MCV 87.5 03/04/2011   PLT 265 03/04/2011      Component Value Date/Time   NA 141 03/04/2011 1131   K 4.2 03/04/2011 1131   CL 103 03/04/2011 1131   CO2 25 03/04/2011 1131   GLUCOSE 90 03/04/2011 1131   BUN 12 03/04/2011 1131   CREATININE 0.92 03/04/2011 1131  CALCIUM 9.7 03/04/2011 1131   GFRNONAA >60 01/10/2009 2234   GFRAA  Value: >60        The eGFR has been calculated using the MDRD equation. This calculation has not been validated in all clinical situations. eGFR's persistently <60 mL/min signify possible Chronic Kidney Disease. 01/10/2009 2234   No results found for this basename: INR, PROTIME   No results found for this basename: PTT    SOCIAL HISTORY: History   Social History  . Marital Status: Single    Spouse Name: N/A    Number of Children: 2  . Years of Education: N/A   Occupational History  .  [employer]    now MRI tech at [employer]    Social History Main Topics  . Smoking status: Never Smoker   . Smokeless tobacco: Former Neurosurgeon    Types: Chew     Quit date: 03/04/1999  . Alcohol Use: Yes     socially  . Drug Use: Yes     high school , college  . Sexually Active:    Other Topics Concern  . Not on file   Social History Narrative  . No narrative on file    FAMILY HISTORY: Family History  Problem Relation Age of Onset  . Diabetes Mother   . Hypertension Mother   . Stroke Maternal Grandfather      REVIEW OF SYSTEMS: Reviewed from chart for this admission.  DENTAL HISTORY: Chief Complaint: Patient needs a pre-chemoradiation therapy dental evaluation.  HPI: Kyle Hendrix is a 46 year old male with squamous cell carcinoma of the right tonsil. Patient with anticipated chemoradiation therapy. Patient is now be seen as part of a pre-chemoradiation therapy dental protocol evaluation.  Patient currently denies having any acute toothache, swellings, or abscesses. Patient was last seen in September or October 2012 for an exam and cleaning. Patient denies having any unmet dental needs. Patient indicates that he sees Dr. Darnelle Bos for his regular dental care has been seeing him over the past 8-10 years. Patient is a history of having orthodontic therapy from the age of 25 through 36 with Dr. Vanice Sarah. Patient also has a history of having previous temporomandibular joint dysfunction and previous maxillary splint therapy. Patient hasn't not worn the splint for the past 2-3 years and denies having any active acute TMJ symptoms.  DENTAL EXAMINATION:  GENERAL: Patient is a well-developed, well-nourished male in no acute distress. HEAD AND NECK: There is right neck lymphadenopathy. I am unable to palpate any left neck lymphadenopathy. Patient denies acute TMJ symptoms. INTRAORAL EXAM: Patient has normal saliva. I do not see any evidence of abscess formation. DENTITION: Patient is missing tooth numbers 1, 5, 12, 16, 17, 21, 28, and 32. Most spaces are closed with orthodontic therapy. PERIODONTAL: Patient with chronic periodontitis with  plaque and calculus accumulations, selective areas of gingival recession and no significant tooth mobility. DENTAL CARIES/SUBOPTIMAL RESTORATIONS: Tooth #20 hasn't distal caries. The crown margins on tooth #30 are less than ideal. ENDODONTIC: Patient has root canal therapies associated with tooth numbers 18 and 19. There may be some persistent periapical pathology associated with the distal root of tooth #18. CROWN AND BRIDGE: Patient has multiple crown restorations. The crown margins on tooth numbers 19 and 30 appear to be less than ideal. PROSTHODONTIC: There is no history of partial dentures. OCCLUSION: The occlusion is stable at this time.  RADIOGRAPHIC INTERPRETATION: An orthopantogram was obtained and supplemented with a full series of dental radiographs. There  are multiple missing teeth. Most spaces have enclosed secondary to orthodontic therapy. Multiple dental restorations noted. Dental caries are noted. Suboptimal margins of the crowns are noted. Patient has previous root canal therapies associated with tooth numbers 18 and 19. There may be some persistent periapical pathology associated with the distal root of tooth #18.  ASSESSMENTS: 1. Chronic periodontitis with bone loss 2. Plaque and calculus accumulations 3. Dental caries 4. Suboptimal dental restorations 5. Possible persistent periapical pathology associated with the distal root of tooth #18. 6. Multiple missing teeth. 7. History of orthodontic therapy with the closure of multiple spaces. 8. Multiple diastemas 9. Decalcifications on the facial aspect of multiple teeth.   PLAN/RECOMMENDATIONS: 1. I discussed the risks, benefits, and complications of various treatment options with the patient in relationship to the medical and dental conditions. We discussed various treatment options to include no treatment, multiple extractions of the teeth in the primary field of radiation therapy, alveoloplasty, pre-prosthetic surgery as  indicated, periodontal therapy, dental restorations, root canal therapy, crown and bridge therapy, implant therapy, and replacement of missing teeth as indicated. The patient currently wishes to proceed with referral to an oral surgeon for second opinion on the extraction of tooth numbers 2, 3, 15,18,30,31 with alveoloplasty to achieve primary closure. The teeth are present the teeth in the primary field radiation therapy. An appointment with Dr. Retta Mac has been arranged for Wednesday, March 6 at 1:45 PM. Patient did agree to proceed with impressions for the fabrication of fluoride trays and scatter protection devices as indicated at this time. Patient is to return to dental medicine for the insertion of these devices prior to the simulation appointment with Dr. Mitzi Hansen.  2. Discussion of findings with medical team and dental teams and coordination of future medical and dental care as indicated.   Charlynne Pander, DDS

## 2011-03-05 NOTE — Patient Instructions (Signed)

## 2011-03-06 ENCOUNTER — Ambulatory Visit (HOSPITAL_COMMUNITY)
Admission: RE | Admit: 2011-03-06 | Discharge: 2011-03-06 | Disposition: A | Discharge status: Home / Self Care | Payer: BC Managed Care – PPO | Source: Ambulatory Visit | Attending: Family Medicine | Admitting: Family Medicine

## 2011-03-06 ENCOUNTER — Other Ambulatory Visit: Payer: Self-pay | Admitting: Radiology

## 2011-03-06 ENCOUNTER — Telehealth: Payer: Self-pay | Admitting: *Deleted

## 2011-03-06 ENCOUNTER — Encounter (HOSPITAL_COMMUNITY): Payer: Self-pay | Admitting: Pharmacy Technician

## 2011-03-06 ENCOUNTER — Ambulatory Visit (HOSPITAL_COMMUNITY)
Admission: RE | Admit: 2011-03-06 | Discharge: 2011-03-06 | Disposition: A | Discharge status: Home / Self Care | Payer: BC Managed Care – PPO | Source: Ambulatory Visit | Attending: Radiation Oncology | Admitting: Radiation Oncology

## 2011-03-06 DIAGNOSIS — IMO0002 Reserved for concepts with insufficient information to code with codable children: Secondary | ICD-10-CM | POA: Insufficient documentation

## 2011-03-06 DIAGNOSIS — T17308A Unspecified foreign body in larynx causing other injury, initial encounter: Secondary | ICD-10-CM | POA: Insufficient documentation

## 2011-03-06 DIAGNOSIS — C099 Malignant neoplasm of tonsil, unspecified: Secondary | ICD-10-CM | POA: Insufficient documentation

## 2011-03-06 HISTORY — PX: PEG PLACEMENT: SHX5437

## 2011-03-06 NOTE — Telephone Encounter (Signed)
Called [facility] to confirm that Teaching for PEG tube has been ordered.  PEG ordered by Dr. Mitzi Hansen and scheduled for 3/.07/13.   She will check for referral.

## 2011-03-06 NOTE — Procedures (Signed)
Modified Barium Swallow Procedure Note Patient Details  Name: Kyle Hendrix MRN: [id number] Date of Birth: Dec 28, 1965  Today's Date: 03/06/2011 Time:1101 - 1121    Past Medical History:  Past Medical History  Diagnosis Date  . Nasal polyps   . 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 Depression   . Fatigue   . Degeneration of lumbar intervertebral disc   . Allergy     seasonal  . Asthma     seasonal  . 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 TMJ (dislocation of temporomandibular joint)    Past Surgical History:  Past Surgical History  Procedure Date  . Anterior release vertebral body w/ posterior fusion     lumbar  . Back surgery 09/1999    L4-5 fusion   HPI:  Pt is a 46 year old male newly diagnosed with locally advanced tonsillar primary, cT1, cN2b, stage IVA cancer.  Pt has a history of chewing tobacco use - quit 12 years ago, sleep apnea- pt uses CPAP, back surgery (L4-L5 fused per pt), seasonal allergies, ADD, nasal polyp.  Pt denies pain with swallowing, only reported mild discomfort on right side of neck with edema.  Pt plans for PEG placement next week, extraction of some dentiition, and chemoradiation.  Pt denies problems swallowing, acknowledges "thick saliva" lately.  He has two children, a 40 year old and twenty two year old.       Recommendation/Prognosis  Clinical Impression Dysphagia Diagnosis: Within Functional Limits Clinical impression: Pt presents with a functional swallow, no aspiration or penetration of any consistency tested (thin, ba tablet, nectar, pudding, cracker) nor significant stasis.  Rec pt continue regular diet and see SLP for education prior to intiation of chemoradiation.  SLP educated pt briefly about xerostomia and provided tips, as well as importance of continuing to eat/drink during tx and that liquids may be easier to tolerate during tx.  Also educated pt to possible dietary changes during his  tx.  Thanks for this referral.     Swallow Evaluation Recommendations Solid Consistency: Regular Liquid Consistency: Thin Medication Administration: Whole meds with liquid Compensations: Slow rate;Small sips/bites Oral Care Recommendations: Oral care QID Follow up Recommendations: Outpatient SLP Prognosis Prognosis for Safe Diet Advancement: Good Individuals Consulted Consulted and Agree with Results and Recommendations: Patient  General:  Date of Onset: 03/04/11 HPI: Pt is a 46 year old male newly diagnosed with locally advanced tonsillar primary, cT1, cN2b, stage IVA cancer.  Pt has a history of chewing tobacco use - quit 12 years ago, sleep apnea- pt uses CPAP, back surgery (L4-L5 fused per pt), seasonal allergies, ADD, nasal polyp.  Pt denies pain with swallowing, only reported mild discomfort on right side of neck with edema.  Pt plans for PEG placement next week, extraction of some dentiition, and chemoradiation.  Pt denies problems swallowing, acknowledges "thick saliva" lately.  He has two children, a 36 year old and twenty two year old.   Type of Study: Initial MBS Diet Prior to this Study: Regular;Thin liquids Respiratory Status: Room air Behavior/Cognition: Alert Oral Cavity - Dentition: Adequate natural dentition Patient Positioning: Postural control adequate for testing Baseline Vocal Quality: Clear Volitional Cough: Strong (productive to thin clear secretions) Volitional Swallow: Able to elicit Anatomy: Other (Comment) (appearance of abnormal area in pharynx, pt with known ca)  Reason for Referral:  "concern for aspiration"  Oral Phase Oral Preparation/Oral Phase Oral Phase: WFL Pharyngeal Phase  Pharyngeal Phase Pharyngeal Phase: Within  functional limits Pharyngeal Phase - Comment Pharyngeal Comment: trace stasis in pharynx - functional Cervical Esophageal Phase  Cervical Esophageal Phase Cervical Esophageal Phase: Leonarda Salon     Chales Abrahams 03/06/2011,  12:53 PM

## 2011-03-06 NOTE — Progress Notes (Signed)
Encounter addended by: Glennie Hawk, RN on: 03/06/2011  9:00 AM<BR>     Documentation filed: Charges VN

## 2011-03-09 ENCOUNTER — Other Ambulatory Visit: Payer: Self-pay | Admitting: *Deleted

## 2011-03-09 ENCOUNTER — Ambulatory Visit (HOSPITAL_COMMUNITY): Admission: RE | Admit: 2011-03-09 | Payer: BC Managed Care – PPO | Source: Ambulatory Visit

## 2011-03-09 ENCOUNTER — Other Ambulatory Visit: Payer: Self-pay | Admitting: Radiology

## 2011-03-09 DIAGNOSIS — C099 Malignant neoplasm of tonsil, unspecified: Secondary | ICD-10-CM

## 2011-03-10 ENCOUNTER — Other Ambulatory Visit: Payer: Self-pay | Admitting: Oncology

## 2011-03-10 ENCOUNTER — Encounter (HOSPITAL_COMMUNITY): Payer: Self-pay | Admitting: Dentistry

## 2011-03-10 ENCOUNTER — Telehealth: Payer: Self-pay | Admitting: *Deleted

## 2011-03-10 NOTE — Telephone Encounter (Signed)
Please tell him that I'll see him on 03/25/11.  I have put in a POF; a scheduler will call him.  This appointment is again very tentative.  As soon as he knows a start date for XRT; he must let us know to move his chemo appointment accordingly (he should NOT depend on Rad Onc to inform us).  Thanks.

## 2011-03-10 NOTE — Telephone Encounter (Signed)
Called pt back to inform him that Dr. Gaylyn Rong will r/s his appt to 3/20 and to expect call from schedulers w/ time.  Also reminded pt to call us as soon as he is aware of his Radiation start date so we can schedule his chemo accordingly.  He verbalized understanding.   He also reports some head congestion/cold like symptoms w/ tender lymph node under his armpit.  Informed pt this is likely r/t his cold symptoms if node feels tender, but pt has appt to see Dr. Lazarus Salines tomorrow and he will show this to Dr. Lazarus Salines.  Explained to pt even if it is r/t the lymphoma it should not change his staging or treatment.  He verbalized understanding.

## 2011-03-10 NOTE — Telephone Encounter (Signed)
Pt left VM asking if ok to r/s his appt w/ Dr. Gaylyn Rong on 03/20/11 to a different day?  It conflicts w/ a new employee orientation class he is expected to attend for [facility] [employer].

## 2011-03-11 ENCOUNTER — Encounter (HOSPITAL_COMMUNITY): Payer: Self-pay | Admitting: Pharmacy Technician

## 2011-03-11 ENCOUNTER — Encounter (HOSPITAL_COMMUNITY)
Admission: RE | Admit: 2011-03-11 | Discharge: 2011-03-11 | Disposition: A | Discharge status: Home / Self Care | Payer: BC Managed Care – PPO | Source: Ambulatory Visit | Attending: Anesthesiology | Admitting: Anesthesiology

## 2011-03-11 ENCOUNTER — Other Ambulatory Visit: Payer: Self-pay

## 2011-03-11 ENCOUNTER — Encounter (HOSPITAL_COMMUNITY): Payer: Self-pay

## 2011-03-11 ENCOUNTER — Telehealth: Payer: Self-pay | Admitting: Oncology

## 2011-03-11 ENCOUNTER — Telehealth (HOSPITAL_COMMUNITY): Payer: Self-pay | Admitting: Radiation Oncology

## 2011-03-11 ENCOUNTER — Encounter (HOSPITAL_COMMUNITY)
Admission: RE | Admit: 2011-03-11 | Discharge: 2011-03-11 | Disposition: A | Discharge status: Home / Self Care | Payer: BC Managed Care – PPO | Source: Ambulatory Visit | Attending: Otolaryngology | Admitting: Otolaryngology

## 2011-03-11 LAB — SURGICAL PCR SCREEN
MRSA, PCR: NEGATIVE
Staphylococcus aureus: NEGATIVE

## 2011-03-11 LAB — BASIC METABOLIC PANEL
Calcium: 9.5 mg/dL (ref 8.4–10.5)
Creatinine, Ser: 0.88 mg/dL (ref 0.50–1.35)
GFR calc Af Amer: >90 mL/min (ref >90)
GFR calc non Af Amer: >90 mL/min (ref >90)
Sodium: 141 mEq/L (ref 135–145)

## 2011-03-11 LAB — CBC
Platelets: 228 10*3/uL (ref 150–400)
RBC: 4.73 MIL/uL (ref 4.22–5.81)
RDW: 12.5 % (ref 11.5–15.5)
WBC: 7.2 10*3/uL (ref 4.0–10.5)

## 2011-03-11 NOTE — Pre-Procedure Instructions (Signed)
20 Kyle Hendrix [facility]  03/11/2011   Your procedure is scheduled on:  March 13 (Wed)  Report to [facility] at 6:30 AM.  Call this number if you have problems the morning of surgery: [phone]   Remember:   Do not eat food:After Midnight.  May have clear liquids: up to 4 Hours before arrival.  Clear liquids include soda, tea, black coffee, apple or grape juice, broth.  Take these medicines the morning of surgery with A SIP OF WATER: ativan/zofran as needed, zoloft   Do not wear jewelry, make-up or nail polish.  Do not wear lotions, powders, or perfumes. You may wear deodorant.  Do not shave 48 hours prior to surgery.  Do not bring valuables to the hospital.  Contacts, dentures or bridgework may not be worn into surgery.  Leave suitcase in the car. After surgery it may be brought to your room.  For patients admitted to the hospital, checkout time is 11:00 AM the day of discharge.   Patients discharged the day of surgery will not be allowed to drive home.  Name and phone number of your driver: NA  Special Instructions: CHG Shower Use Special Wash: 1/2 bottle night before surgery and 1/2 bottle morning of surgery.   Please read over the following fact sheets that you were given: Pain Booklet, MRSA Information and Surgical Site Infection Prevention

## 2011-03-11 NOTE — Telephone Encounter (Signed)
called pt and informed him that his appt on 03/15 was moved to 03/20

## 2011-03-12 ENCOUNTER — Ambulatory Visit (HOSPITAL_COMMUNITY)
Admission: RE | Admit: 2011-03-12 | Discharge: 2011-03-12 | Disposition: A | Discharge status: Home / Self Care | Payer: BC Managed Care – PPO | Source: Ambulatory Visit | Attending: Radiation Oncology | Admitting: Radiation Oncology

## 2011-03-12 ENCOUNTER — Encounter: Payer: Self-pay | Admitting: *Deleted

## 2011-03-12 DIAGNOSIS — Z126 Encounter for screening for malignant neoplasm of bladder: Secondary | ICD-10-CM | POA: Insufficient documentation

## 2011-03-12 DIAGNOSIS — C099 Malignant neoplasm of tonsil, unspecified: Secondary | ICD-10-CM | POA: Insufficient documentation

## 2011-03-12 MED ORDER — ONDANSETRON HCL 4 MG/2ML IJ SOLN
4.0000 mg | Freq: Once | INTRAMUSCULAR | Status: AC
  Administered 2011-03-12: 4 mg via INTRAVENOUS

## 2011-03-12 MED ORDER — SODIUM CHLORIDE 0.9 % IV SOLN: Freq: Once | INTRAVENOUS | Status: DC

## 2011-03-12 MED ORDER — OXYCODONE-ACETAMINOPHEN 5-325 MG PO TABS
ORAL_TABLET | ORAL | Status: AC
  Filled 2011-03-12: qty 1

## 2011-03-12 MED ORDER — ONDANSETRON HCL 4 MG/2ML IJ SOLN
INTRAMUSCULAR | Status: AC
  Filled 2011-03-12: qty 2

## 2011-03-12 MED ORDER — MIDAZOLAM HCL 5 MG/5ML IJ SOLN
INTRAMUSCULAR | Status: AC | PRN
  Administered 2011-03-12 (×2): 2 mg via INTRAVENOUS

## 2011-03-12 MED ORDER — MIDAZOLAM HCL 2 MG/2ML IJ SOLN
INTRAMUSCULAR | Status: AC
  Filled 2011-03-12: qty 4

## 2011-03-12 MED ORDER — CEFAZOLIN SODIUM-DEXTROSE 2-3 GM-% IV SOLR
INTRAVENOUS | Status: AC
  Administered 2011-03-12: 2 g via INTRAVENOUS
  Filled 2011-03-12: qty 50

## 2011-03-12 MED ORDER — OXYCODONE-ACETAMINOPHEN 5-325 MG PO TABS
1.0000 | ORAL_TABLET | ORAL | Status: DC | PRN
  Administered 2011-03-12: 1 via ORAL

## 2011-03-12 MED ORDER — CEFAZOLIN SODIUM 1-5 GM-% IV SOLN: 1.0000 g | Freq: Once | INTRAVENOUS | Status: DC

## 2011-03-12 MED ORDER — FENTANYL CITRATE 0.05 MG/ML IJ SOLN
INTRAMUSCULAR | Status: AC
  Filled 2011-03-12: qty 4

## 2011-03-12 MED ORDER — FENTANYL CITRATE 0.05 MG/ML IJ SOLN
INTRAMUSCULAR | Status: AC | PRN
  Administered 2011-03-12: 100 ug via INTRAVENOUS

## 2011-03-12 NOTE — Progress Notes (Signed)
[facility]  Clinical Social Work  Clinical Social Work was referred by patient to complete healthcare advance directives. CSW and patient met in office today and went over directives. Mr. Rayman had already reviewed the documents and had only a few questions regarding completion. The patient was very clear he does not want any life supportive measures if he has any condition that will result in his death in a short period of time or if he is unconscious and will not regain his consciousness.  He designated his spouse, Kiyon Fidalgo [phone]) as his primary HCPOA. CSW will send documents to be scanned into medical record. Documents can be accessed under chart review in media tab. CSW encouraged patient to contact with any additional questions or concerns.  Kathrin Penner, MSW, [facility] Clinical Social Worker [facility] [phone]

## 2011-03-12 NOTE — Procedures (Signed)
Successful placement of 20 [place] G-tube with fluoroscopy.  No immediate complication.  See Radiology report.

## 2011-03-12 NOTE — Progress Notes (Signed)
Dr. Lowella Dandy here to see patient post procedure. Instructions reviewed with patient and family. Appointment at [facility] tomorrow with nutritionist. [facility] has contacted patient and they are sending a nurse out tomorrow regarding G-tube care. Patient has written instructions. Verbalized understanding. Home with wife at 89.

## 2011-03-12 NOTE — Discharge Instructions (Signed)
Gastrostomy Tube, Adult      Call your MD for any problems or questions. Keep your appointment at the [facility] with nutritionist  tomorrow. Home care nurses will be out to see you regarding your G-tube care.   A gastrostomy tube is a tube that is placed into the stomach. It is also called a "G-tube." This tube is used for:  Feeding.   Giving medication.  CLEANING THE G-TUBE SITE  Wash your hands with soap and water.   Remove the old dressing (if any). Some styles of G-tubes may need a dressing inserted between the skin and the G-tube. Other types of G-tubes do not require a dressing. Ask your caregiver if a dressing is needed.   Check the area where the tube enters the skin (insertion site) for redness, swelling, or pus-like (purulent) drainage. A small amount of clear or tan liquid drainage is normal. Check to make sure scar tissue (skin) is not growing around the insertion site. This could have a raised, bumpy appearance.   A cotton swab can be used to clean the skin around the tube:   When the G-tube is first put in, a normal saline solution or water can be used to clean the skin.   Mild soap and warm water can be used when the skin around the G-tube site has healed.   Roll the cotton swab around the G-tube insertion site to remove any drainage or crusting at the insertion site.  RESIDUALS Feeding tube residuals are the amount of liquids that are in the stomach at any given time. Residuals may be checked before giving feedings, medications, or as instructed by your caregiver.  Ask your caregiver if there are instances when you would not start tube feedings depending on the amount or type of contents withdrawn from the stomach.   Check residuals by attaching a syringe to the G-tube and pull back on the syringe plunger. Note the amount and return the residual back into the stomach.  FLUSHING THE G-TUBE  The G-tube should be periodically flushed with clean  warm water to keep it from clogging.   Flush the G-tube after feedings or medications. Draw up 30 mLs of warm water in a syringe. Connect the syringe to the G-tube and slowly push the water into the tube.   Do not push feedings, medications, or flushes rapidly. Flush the G-tube gently and slowly.   Only use syringes made for G-tubes to flush medications or feedings.   Your caregiver may want the G-tube flushed more often or with more water. If this is the case, follow your caregiver's instructions.  FEEDINGS Your caregiver will determine whether feedings are given as a bolus (a certain amount given at one time and at scheduled times) or whether feedings will be given continuously on a feeding pump.   Formulas should be given at room temperature.   If feedings are continuous, no more than 4 hours worth of feedings should be placed in the feeding bag. This helps prevent spoilage or accidental excess infusion.   Cover and place unused formula in the refrigerator.   If feedings are continuous, stop the feedings when medications or flushes are given. Be sure to restart the feedings.   Feeding bags and syringes should be replaced as instructed by your caregiver.  GIVING MEDICATION   In general, it is best if all medications are in a liquid form for G-tube administration. Liquid medications are less likely  to clog the G-tube.   Mix the liquid medication with 30 mLs (or amount recommended by your caregiver) of warm water.   Draw up the medication into the syringe.   Attach the syringe to the G-tube and slowly push the mixture into the G-tube.   After giving the medication, draw up 30 mLs of warm water in the syringe and slowly flush the G-tube.   For pills or capsules, check with your caregiver first before crushing medications. Some pills are not effective if they are crushed. Some capsules are sustained release medications.   If appropriate, crush the pill or capsule and mix with 30 mLs  of warm water. Using the syringe, slowly push the medication through the tube, then flush the tube with another 30 mLs of tap water.  G-TUBE PROBLEMS G-tube was pulled out.  Cause: May have been pulled out accidentally.   Solutions: Cover the opening with clean dressing and tape. Call your caregiver right away. The G-tube should be put in as soon as possible (within 4 hours) so the G-tube opening (tract) does not close. The G-tube needs to be put in at a healthcare setting. An X-ray needs to be done to confirm placement before the G-tube can be used again.  Redness, irritation, soreness, or foul odor around the gastrostomy site.  Cause: May be caused by leakage or infection.   Solutions: Call your caregiver right away.  Large amount of leakage of fluid or mucus-like liquid present (a large amount means it soaks clothing).  Cause: Many reasons could cause the G-tube to leak.   Solutions: Call your caregiver to discuss the amount of leakage.  Skin or scar tissue appears to be growing where tube enters skin.   Cause: Tissue growth may develop around the insertion site if the G-tube is moved or pulled on excessively.   Solutions: Secure tube with tape so that excess movement does not occur. Call your caregiver.  G-tube is clogged.  Cause: Thick formula or medication.   Solutions: Try to slowly push warm water into the tube with a large syringe. Never try to push any object into the tube to unclog it. Do not force fluid into the G-tube. If you are unable to unclog the tube, call your caregiver right away.  TIPS  Head of Bed [facility]) position refers to the upright position of a person's upper body.   When giving medications or a feeding bolus, keep the HOB up as told by your caregiver. Do this during the feeding and for 1 hour after the feeding or medication administration.   If continuous feedings are being given, it is best to keep the [facility] up as told by your caregiver. When ADLs (Activities  of Daily Living) are performed and the [facility] needs to be flat, be sure to turn the feeding pump off. Restart the feeding pump when the [facility] is returned to the recommended height.   Do not pull or put tension on the tube.   To prevent fluid backflow, kink the G-tube before removing the cap or disconnecting a syringe.   Check the G-tube length every day. Measure from the insertion site to the end of the G-tube. If the length is longer than previous measurements, the tube may be coming out. Call your caregiver if you notice increasing G-tube length.   Oral care, such as brushing teeth, must be continued.   You may need to remove excess air (vent) from the G-tube. Your caregiver will tell you if  this is needed.   Always call your caregiver if you have questions or problems with the G-tube.  SEEK IMMEDIATE MEDICAL CARE IF:   You have severe abdominal pain, tenderness, or abdominal bloating(distension).   You have nausea or vomiting.   You are constipated or have problems moving your bowels.   The G-tube insertion site is red, swollen, has a foul smell, or has yellow or brown drainage.   You have difficulty breathing or shortness of breath.   You have a fever.   You have a large amount of feeding tube residuals.   The G-tube is clogged and cannot be flushed.  MAKE SURE YOU:   Understand these instructions.   Will watch your condition.   Will get help right away if you are not doing well or get worse.  Document Released: 03/02/2001 Document Revised: 12/11/2010 Document Reviewed: 04/19/2007 [facility] Patient Information 2012 [place], [place].

## 2011-03-12 NOTE — Progress Notes (Signed)
Pt arrived to unit c HOB elevated 30 degrees  Verbalizes understanding to  Remain in this position until further  notice

## 2011-03-12 NOTE — H&P (Signed)
Kyle Hendrix is an 46 y.o. male.   Chief Complaint: tonsillar cancer.   HPI: In need of gastric tube placement for nutritional access during treatment for tonsillar cancer.   Past Medical History  Diagnosis Date  . Nasal polyps   . 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 Depression   . Fatigue   . Degeneration of lumbar intervertebral disc   . Allergy     seasonal  . Asthma     seasonal  . 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 TMJ (dislocation of temporomandibular joint)   . Sleep apnea     Past Surgical History  Procedure Date  . Anterior release vertebral body w/ posterior fusion     lumbar  . Back surgery 09/1999    L4-5 fusion    Family History  Problem Relation Age of Onset  . Diabetes Mother   . Hypertension Mother   . Stroke Maternal Grandfather    Social History:  reports that he has never smoked. He quit smokeless tobacco use about 12 years ago. His smokeless tobacco use included Chew. He reports that he drinks alcohol. He reports that he uses illicit drugs.  Allergies:  Allergies  Allergen Reactions  . Iodine Anaphylaxis  . Shellfish Allergy Anaphylaxis  . Avelox (Moxifloxacin Hcl In Nacl) Hives  . Morphine And Related Hives  . Prednisone Other (See Comments)    Altered mental status  . Ultram (Tramadol Hcl) Hives    Medications Prior to Admission  Medication Sig Dispense Refill  . lisdexamfetamine (VYVANSE) 70 MG capsule Take 70 mg by mouth every morning.      Marland Kitchen LORazepam (ATIVAN) 0.5 MG tablet Take 1 tablet (0.5 mg total) by mouth every 6 (six) hours as needed (Nausea or vomiting).  30 tablet  0  . ondansetron (ZOFRAN) 8 MG tablet Take 1 tablet two times a day as needed for nausea or vomiting starting on the third day after chemotherapy.  30 tablet  1  . prochlorperazine (COMPAZINE) 10 MG tablet Take 1 tablet (10 mg total) by mouth every 6 (six) hours as needed (Nausea or vomiting).  30 tablet  1  .  sertraline (ZOLOFT) 100 MG tablet Take 100 mg by mouth at bedtime.       . sulfamethoxazole-trimethoprim (BACTRIM DS) 800-160 MG per tablet Take 1 tablet by mouth 2 (two) times daily.       Medications Prior to Admission  Medication Dose Route Frequency Provider Last Rate Last Dose  . 0.9 %  sodium chloride infusion   Intravenous Once Robet Leu, PA      . ceFAZolin (ANCEF) 2-3 GM-% IVPB SOLR           . ceFAZolin (ANCEF) IVPB 1 g/50 mL premix  1 g Intravenous Once Robet Leu, PA      . fentaNYL (SUBLIMAZE) 0.05 MG/ML injection           . midazolam (VERSED) 2 MG/2ML injection             Results for orders placed during the hospital encounter of 03/11/11 (from the past 48 hour(s))  SURGICAL PCR SCREEN     Status: Normal   Collection Time   03/11/11  3:47 PM      Component Value Range Comment   MRSA, PCR NEGATIVE  NEGATIVE     Staphylococcus aureus NEGATIVE  NEGATIVE    BASIC METABOLIC PANEL     Status: Normal  Collection Time   03/11/11  3:53 PM      Component Value Range Comment   Sodium 141  135 - 145 (mEq/L)    Potassium 3.6  3.5 - 5.1 (mEq/L)    Chloride 103  96 - 112 (mEq/L)    CO2 26  19 - 32 (mEq/L)    Glucose, Bld 91  70 - 99 (mg/dL)    BUN 8  6 - 23 (mg/dL)    Creatinine, Ser 4.09  0.50 - 1.35 (mg/dL)    Calcium 9.5  8.4 - 10.5 (mg/dL)    GFR calc non Af Amer >90  >90 (mL/min)    GFR calc Af Amer >90  >90 (mL/min)   CBC     Status: Normal   Collection Time   03/11/11  3:53 PM      Component Value Range Comment   WBC 7.2  4.0 - 10.5 (K/uL)    RBC 4.73  4.22 - 5.81 (MIL/uL)    Hemoglobin 14.1  13.0 - 17.0 (g/dL)    HCT 81.1  91.4 - 78.2 (%)    MCV 85.6  78.0 - 100.0 (fL)    MCH 29.8  26.0 - 34.0 (pg)    MCHC 34.8  30.0 - 36.0 (g/dL)    RDW 95.6  21.3 - 08.6 (%)    Platelets 228  150 - 400 (K/uL)    Dg Chest 2 View  03/11/2011  *RADIOLOGY REPORT*  Clinical Data: Preop for endoscopy and biopsy.  CHEST - 2 VIEW  Comparison: 01/10/2009.  Findings: The cardiac  silhouette, mediastinal and hilar contours are within normal limits and stable.  The lungs are clear.  No pleural effusion.  The bony thorax is intact.  IMPRESSION: No acute cardiopulmonary findings.  Original Report Authenticated By: P. Loralie Champagne, M.D.    Review of Systems  Constitutional: Negative for fever, chills and malaise/fatigue.  HENT: Positive for sore throat.   Respiratory: Negative for hemoptysis, sputum production and shortness of breath.   Cardiovascular: Positive for PND. Negative for chest pain, palpitations and claudication.  Gastrointestinal: Positive for heartburn.  Musculoskeletal: Negative.   Neurological: Negative.   Psychiatric/Behavioral: Positive for depression. The patient is nervous/anxious.    Physical Exam  Constitutional: He is oriented to person, place, and time. He appears well-developed and well-nourished.  HENT:  Head: Normocephalic and atraumatic.  Neck: Normal range of motion.  Cardiovascular: Normal rate, regular rhythm and normal heart sounds.  Exam reveals no gallop and no friction rub.   No murmur heard. Respiratory: Effort normal and breath sounds normal. No respiratory distress. He has no wheezes. He has no rales.  GI: Bowel sounds are normal.  Musculoskeletal: Normal range of motion.  Neurological: He is alert and oriented to person, place, and time.  Skin: Skin is warm and dry.  Psychiatric: He has a normal mood and affect. His behavior is normal. Judgment and thought content normal.     Assessment/Plan Patient for gastric tube placement for supplemental nutritional access during treatment for tonsillar cancer.  Procedure details, risks and benefits discussed with his apparent understanding.  Written consent obtained.   [name] 03/12/2011, 1:22 PM

## 2011-03-13 ENCOUNTER — Ambulatory Visit (HOSPITAL_COMMUNITY): Payer: BC Managed Care – PPO

## 2011-03-13 ENCOUNTER — Ambulatory Visit: Payer: BC Managed Care – PPO | Admitting: Nutrition

## 2011-03-13 NOTE — Progress Notes (Signed)
Encounter addended by: Delynn Flavin, RN on: 03/13/2011  7:49 PM<BR>     Documentation filed: Visit Diagnoses

## 2011-03-13 NOTE — Assessment & Plan Note (Signed)
[name] is a 46 year old male patient of Dr. Gaylyn Rong and Dr. Mitzi Hansen diagnosed with tonsil cancer.  MEDICAL HISTORY INCLUDES:  ADD, depression, fatigue, TMJ, sleep apnea and chewing tobacco.  MEDICATIONS INCLUDE:  Abilify, Ativan, Zofran, Zoloft, and Compazine.  LABS:  Reviewed.  HEIGHT:  68 inches. WEIGHT:  231 pounds on 02/27. BMI:  35.13.  NUTRITIONAL NEEDS:  Estimated nutrition needs:  2450 to 2600 calories, 120 to 135 g of protein, and 2.7 L of fluid daily.  The patient is status post a G-tube placement on March 7th.  He is married but also has the support of his mother and father to help him through treatments.  He is expecting to have some dental work done in the next few weeks and start chemotherapy the end of the month along with radiation treatment.  NUTRITION DIAGNOSIS:  Predicted suboptimal energy intake related to new diagnosis of tonsil cancer and associated treatment as evidenced by history of this condition, for which research shows an increased incidence of suboptimal energy intake.  INTERVENTION:  I have educated the patient and his mother on the importance of small, frequent high-calorie, high-protein meals and snacks throughout the day. I have done some education on strategies for managing constipation and nausea.  I have reviewed tube feeding administration.  I have provided him with samples of nutritional supplements along with multiple fact sheets, coupons and contact information for questions and concerns.  MONITORING/EVALUATION (GOALS):  The patient will tolerate oral diet to minimize weight loss to less than 5% of usual weight.   NEXT VISIT:  Wednesday, April 10th, during chemotherapy.  The patient has my contact information to call me sooner if needed.    ______________________________ Zenovia Jarred, RD, LDN Clinical Nutrition Specialist BN/MEDQ  D:  03/13/2011  T:  03/13/2011  Job:  [id number]

## 2011-03-16 HISTORY — PX: MULTIPLE TOOTH EXTRACTIONS: SHX2053

## 2011-03-17 ENCOUNTER — Encounter (HOSPITAL_COMMUNITY): Payer: Self-pay | Admitting: Otolaryngology

## 2011-03-17 ENCOUNTER — Other Ambulatory Visit: Payer: Self-pay | Admitting: Otolaryngology

## 2011-03-17 MED ORDER — SODIUM CHLORIDE 0.9 % IV SOLN
1.0000 g | Freq: Once | INTRAVENOUS | Status: DC
  Filled 2011-03-17: qty 1000

## 2011-03-17 NOTE — H&P (Signed)
Kyle Hendrix, Kyle Hendrix 46 y.o., male [id number]     Chief Complaint:  RIGHT neck metastatic lymphadenopathy  History:  46 year old white male sneezed twice bilaterally 3 weeks ago.  He noticed a painful swelling in his RIGHT upper neck.  No drainage from the neck or from the ear.  He was seen shortly thereafter by [facility] family practice and told that he had an ear infection with fluid.  He had no pain in the ear, nor any significant muffling of his hearing.  He was given a course of Augmentin which did not seem to help.  The ear feels fine now, but the neck mass which is less tender has gotten somewhat bigger and firm.  He has occasional feelings which he would describe as a hot flash.  No documented fever or night sweats.  No weight loss.  No prior history of cancer.  No HIV exposure.  Perhaps mild xerostomia at night, and no bad taste in the mouth or problematic dry mouth.  He does not smoke but used some oral tobacco in the past.  No evident masses in his axillae or groins.  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  He did a quick MRI scan of his neck which was not interpreted by radiologist.  By my reading, he has a complex lesion anteroinferior to the parotid gland with a central area representing water density.  Needle aspiration was performed which showed squamous cell carcinoma.  The PET scan showed activity in the level II RIGHT neck, and the RIGHT tonsil.  Minimal activity in a LEFT axillary node, a peritracheal node, and LEFT neck node.  .  Incidental note on the scan is made of some polypoid sinus disease.  He is aware that this has been an issue in the past.  He also has obstructive sleep apnea and is not wearing his CPAP much at present.  Past Medical History  Diagnosis Date  . Nasal polyps   . 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 Depression   . Fatigue   . Degeneration of lumbar intervertebral disc   . Allergy     seasonal  .  Asthma     seasonal  . 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 TMJ (dislocation of temporomandibular joint)   . Sleep apnea     Surg Hx: Past Surgical History  Procedure Date  . Anterior release vertebral body w/ posterior fusion     lumbar  . Back surgery 09/1999    L4-5 fusion    FHx:   Family History  Problem Relation Age of Onset  . Diabetes Mother   . Hypertension Mother   . Stroke Maternal Grandfather    SocHx:  reports that he has never smoked. He quit smokeless tobacco use about 12 years ago. His smokeless tobacco use included Chew. He reports that he drinks alcohol. He reports that he uses illicit drugs.  ALLERGIES:  Allergies  Allergen Reactions  . Contrast Media (Iodinated Diagnostic Agents) Anaphylaxis    Not recommended due to severe Iodine allergy.  . Iodine Anaphylaxis  . Shellfish Allergy Anaphylaxis  . Betadine (Povidone Iodine) Rash  . Avelox (Moxifloxacin Hcl In Nacl) Hives  . Morphine And Related Hives  . Prednisone Other (See Comments)    Altered mental status  . Ultram (Tramadol Hcl) Hives    Medications Prior to Admission  Medication Dose Route Frequency Provider Last Rate Last Dose  . ampicillin (  OMNIPEN) 1 g in sodium chloride 0.9 % 50 mL IVPB  1 g Intravenous Once Kyle Shanks, MD       Medications Prior to Admission  Medication Sig Dispense Refill  . lisdexamfetamine (VYVANSE) 70 MG capsule Take 70 mg by mouth every morning.      Marland Kitchen LORazepam (ATIVAN) 0.5 MG tablet Take 1 tablet (0.5 mg total) by mouth every 6 (six) hours as needed (Nausea or vomiting).  30 tablet  0  . ondansetron (ZOFRAN) 8 MG tablet Take 1 tablet two times a day as needed for nausea or vomiting starting on the third day after chemotherapy.  30 tablet  1  . prochlorperazine (COMPAZINE) 10 MG tablet Take 1 tablet (10 mg total) by mouth every 6 (six) hours as needed (Nausea or vomiting).  30 tablet  1  . sertraline (ZOLOFT) 100 MG tablet Take 100 mg by mouth at  bedtime.         No results found for this or any previous visit (from the past 48 hour(s)). No results found.  ROS:No headaches or mental status changes.  No breathing, swallowing or speaking changes. No SOB or chest pain.  No abd pain.  Ext. Without symptoms.  No endocrine, neurologic, or urologic symptoms.  There were no vitals taken for this visit.  PHYSICAL EXAM: He is large framed and somewhat heavyset.  He looks tired.  He is snuffling and coughing.  He is not warm to touch.  Ears are clear.  Anterior nose is congested.  Oral cavity and pharynx are clear with 2+ embedded tonsils and no visible lesion.  Neck exam still has a large RIGHT level II mass.   Lungs are clear to auscultation.  Heart with regular rate and rhythm.  No murmurs.  Abdomen is soft and active.  Extremities of normal configuration.  Neurologic testing grossly intact and symmetric.  He has a 1.5-2 cm tender low LEFT axillary node with no overlying skin changes.  Studies Reviewed:records, PET scan.    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  Acute sinusitis/bronchitis.  LEFT axillary lymphadenopathy.  Preoperative visit for panendoscopy and biopsy.  This may include frozen sections of the RIGHT tonsil or base of tongue, and also possible RIGHT tonsillectomy.  Details of the surgery were discussed.  Risk and complications were explained.  Questions were answered and informed consent was obtained.  We will coordinate with Dr. Kristin Hendrix, the cancer center dentist if necessary.     He has an acute sinusitis and also an inflamed tender LEFT axillary lymph node.  We will cover him with Bactrim DS, two twice daily for one week.  Tussionex for cough.  Roxicet and Lortab liquids for postoperative discomfort.  Recheck here one week postoperative.  I discussed advancement of diet and activity including return to work.  It may be 2-3 weeks before he can begin radiation.  Kyle Hendrix, Kyle Hendrix 03/17/2011, 11:14 PM

## 2011-03-17 NOTE — Progress Notes (Signed)
Spoke with Misty Stanley at [facility] office re: new order with Procedure and diagnosis corrected.  States she will speak with Amy (who was call previously) and fax [place] the new order.

## 2011-03-17 NOTE — Progress Notes (Signed)
Spoke with Amy who will fax new order with procedure and diagnosis to [place] now but is still waiting to hear back from pt re: pt's allergies to determine what type of reaction pt has and if decadron is still OK to give and will notify us if change in that order needs to be made

## 2011-03-18 ENCOUNTER — Encounter (HOSPITAL_COMMUNITY)
Admission: RE | Disposition: A | Discharge status: Hospice | Payer: Self-pay | Source: Ambulatory Visit | Attending: Otolaryngology

## 2011-03-18 ENCOUNTER — Encounter (HOSPITAL_COMMUNITY): Payer: Self-pay | Admitting: Anesthesiology

## 2011-03-18 ENCOUNTER — Ambulatory Visit (HOSPITAL_COMMUNITY): Payer: BC Managed Care – PPO | Admitting: Anesthesiology

## 2011-03-18 ENCOUNTER — Ambulatory Visit (HOSPITAL_COMMUNITY)
Admission: RE | Admit: 2011-03-18 | Discharge: 2011-03-19 | Disposition: A | Discharge status: Hospice | Payer: BC Managed Care – PPO | Source: Ambulatory Visit | Attending: Otolaryngology | Admitting: Otolaryngology

## 2011-03-18 ENCOUNTER — Encounter (HOSPITAL_COMMUNITY): Payer: Self-pay | Admitting: Surgery

## 2011-03-18 DIAGNOSIS — D1039 Benign neoplasm of other parts of mouth: Secondary | ICD-10-CM | POA: Insufficient documentation

## 2011-03-18 DIAGNOSIS — F329 Major depressive disorder, single episode, unspecified: Secondary | ICD-10-CM | POA: Insufficient documentation

## 2011-03-18 DIAGNOSIS — R599 Enlarged lymph nodes, unspecified: Secondary | ICD-10-CM | POA: Insufficient documentation

## 2011-03-18 DIAGNOSIS — C801 Malignant (primary) neoplasm, unspecified: Secondary | ICD-10-CM

## 2011-03-18 DIAGNOSIS — G4733 Obstructive sleep apnea (adult) (pediatric): Secondary | ICD-10-CM | POA: Insufficient documentation

## 2011-03-18 DIAGNOSIS — J45909 Unspecified asthma, uncomplicated: Secondary | ICD-10-CM | POA: Insufficient documentation

## 2011-03-18 DIAGNOSIS — C099 Malignant neoplasm of tonsil, unspecified: Secondary | ICD-10-CM | POA: Insufficient documentation

## 2011-03-18 DIAGNOSIS — F3289 Other specified depressive episodes: Secondary | ICD-10-CM | POA: Insufficient documentation

## 2011-03-18 HISTORY — PX: PANENDOSCOPY: SHX2159

## 2011-03-18 HISTORY — PX: TONSILLECTOMY: SHX5217

## 2011-03-18 SURGERY — LARYNGOSCOPY, WITH BRONCHOSCOPY AND ESOPHAGOSCOPY
Anesthesia: General | Site: Throat | Laterality: Right | Wound class: Clean Contaminated

## 2011-03-18 MED ORDER — ONDANSETRON HCL 4 MG PO TABS: 8.0000 mg | ORAL_TABLET | Freq: Three times a day (TID) | ORAL | Status: DC | PRN

## 2011-03-18 MED ORDER — GLYCOPYRROLATE 0.2 MG/ML IJ SOLN
INTRAMUSCULAR | Status: DC | PRN
  Administered 2011-03-18: .4 mg via INTRAVENOUS

## 2011-03-18 MED ORDER — FENTANYL CITRATE 0.05 MG/ML IJ SOLN
INTRAMUSCULAR | Status: DC | PRN
  Administered 2011-03-18 (×5): 50 ug via INTRAVENOUS

## 2011-03-18 MED ORDER — FENTANYL CITRATE 0.05 MG/ML IJ SOLN
INTRAMUSCULAR | Status: AC
  Filled 2011-03-18: qty 2

## 2011-03-18 MED ORDER — SERTRALINE HCL 100 MG PO TABS
100.0000 mg | ORAL_TABLET | Freq: Every day | ORAL | Status: DC
  Administered 2011-03-18: 100 mg via ORAL
  Filled 2011-03-18 (×2): qty 1

## 2011-03-18 MED ORDER — ONDANSETRON HCL 4 MG/2ML IJ SOLN: 4.0000 mg | INTRAMUSCULAR | Status: DC | PRN

## 2011-03-18 MED ORDER — PROMETHAZINE HCL 25 MG/ML IJ SOLN
INTRAMUSCULAR | Status: AC
  Administered 2011-03-18: 6.25 mg via INTRAVENOUS
  Filled 2011-03-18: qty 1

## 2011-03-18 MED ORDER — COCAINE HCL POWD
Status: AC
  Filled 2011-03-18: qty 200

## 2011-03-18 MED ORDER — DEXAMETHASONE SODIUM PHOSPHATE 10 MG/ML IJ SOLN
INTRAMUSCULAR | Status: AC
  Administered 2011-03-18: 10 mg via INTRAVENOUS
  Filled 2011-03-18: qty 1

## 2011-03-18 MED ORDER — ONDANSETRON HCL 4 MG/2ML IJ SOLN
INTRAMUSCULAR | Status: DC | PRN
  Administered 2011-03-18: 4 mg via INTRAVENOUS

## 2011-03-18 MED ORDER — LIDOCAINE HCL (CARDIAC) 20 MG/ML IV SOLN
INTRAVENOUS | Status: DC | PRN
  Administered 2011-03-18: 100 mg via INTRAVENOUS

## 2011-03-18 MED ORDER — LORAZEPAM 2 MG/ML IJ SOLN: 1.0000 mg | Freq: Once | INTRAMUSCULAR | Status: DC | PRN

## 2011-03-18 MED ORDER — NEOSTIGMINE METHYLSULFATE 1 MG/ML IJ SOLN
INTRAMUSCULAR | Status: DC | PRN
  Administered 2011-03-18: 3 mg via INTRAVENOUS

## 2011-03-18 MED ORDER — FENTANYL CITRATE 0.05 MG/ML IJ SOLN
50.0000 ug | INTRAMUSCULAR | Status: DC | PRN
  Administered 2011-03-18: 50 ug via INTRAVENOUS

## 2011-03-18 MED ORDER — LACTATED RINGERS IV SOLN
INTRAVENOUS | Status: DC | PRN
  Administered 2011-03-18 (×2): via INTRAVENOUS

## 2011-03-18 MED ORDER — LISDEXAMFETAMINE DIMESYLATE 70 MG PO CAPS
70.0000 mg | ORAL_CAPSULE | Freq: Every day | ORAL | Status: DC
Start: 2011-03-19 — End: 2011-03-19

## 2011-03-18 MED ORDER — LORAZEPAM 2 MG/ML IJ SOLN: 1.0000 mg | Freq: Once | INTRAMUSCULAR | Status: AC | PRN

## 2011-03-18 MED ORDER — HYDROCODONE-ACETAMINOPHEN 7.5-500 MG/15ML PO SOLN
ORAL | Status: AC
  Filled 2011-03-18: qty 15

## 2011-03-18 MED ORDER — PROMETHAZINE HCL 25 MG/ML IJ SOLN
6.2500 mg | INTRAMUSCULAR | Status: DC | PRN
  Administered 2011-03-18: 6.25 mg via INTRAVENOUS

## 2011-03-18 MED ORDER — ROCURONIUM BROMIDE 100 MG/10ML IV SOLN
INTRAVENOUS | Status: DC | PRN
  Administered 2011-03-18: 10 mg via INTRAVENOUS
  Administered 2011-03-18: 40 mg via INTRAVENOUS

## 2011-03-18 MED ORDER — SODIUM CHLORIDE 0.9 % IV SOLN
8.0000 mg | Freq: Once | INTRAVENOUS | Status: DC
  Filled 2011-03-18: qty 2

## 2011-03-18 MED ORDER — HYDROMORPHONE HCL PF 1 MG/ML IJ SOLN
0.2500 mg | INTRAMUSCULAR | Status: DC | PRN
  Administered 2011-03-18 (×2): 0.5 mg via INTRAVENOUS

## 2011-03-18 MED ORDER — MIDAZOLAM HCL 2 MG/2ML IJ SOLN: 1.0000 mg | INTRAMUSCULAR | Status: DC | PRN

## 2011-03-18 MED ORDER — BACITRACIN ZINC 500 UNIT/GM EX OINT
1.0000 "application " | TOPICAL_OINTMENT | Freq: Three times a day (TID) | CUTANEOUS | Status: DC
  Administered 2011-03-18 – 2011-03-19 (×2): 1 via TOPICAL
  Filled 2011-03-18: qty 15

## 2011-03-18 MED ORDER — LIDOCAINE-EPINEPHRINE 0.5-1:200000 % IJ SOLN
INTRAMUSCULAR | Status: DC | PRN
  Administered 2011-03-18: 8 mL

## 2011-03-18 MED ORDER — SODIUM CHLORIDE 0.9 % IV SOLN
INTRAVENOUS | Status: DC
  Administered 2011-03-18: 14:00:00 via INTRAVENOUS
  Administered 2011-03-18: 1000 mL via INTRAVENOUS

## 2011-03-18 MED ORDER — MIDAZOLAM HCL 5 MG/5ML IJ SOLN
INTRAMUSCULAR | Status: DC | PRN
  Administered 2011-03-18: 2 mg via INTRAVENOUS

## 2011-03-18 MED ORDER — HYDROCODONE-ACETAMINOPHEN 7.5-500 MG/15ML PO SOLN
10.0000 mL | ORAL | Status: DC | PRN
  Administered 2011-03-18 – 2011-03-19 (×6): 15 mL via ORAL
  Filled 2011-03-18 (×5): qty 15

## 2011-03-18 MED ORDER — SODIUM CHLORIDE 0.9 % IR SOLN
Status: DC | PRN
  Administered 2011-03-18: 1000 mL

## 2011-03-18 MED ORDER — PROPOFOL 10 MG/ML IV EMUL
INTRAVENOUS | Status: DC | PRN
  Administered 2011-03-18: 200 mg via INTRAVENOUS
  Administered 2011-03-18 (×3): 50 mg via INTRAVENOUS
  Administered 2011-03-18: 20 mg via INTRAVENOUS

## 2011-03-18 MED ORDER — LORAZEPAM 0.5 MG PO TABS: 0.5000 mg | ORAL_TABLET | Freq: Four times a day (QID) | ORAL | Status: DC | PRN

## 2011-03-18 MED ORDER — AMPICILLIN SODIUM 1 G IJ SOLR
1.0000 g | Freq: Once | INTRAMUSCULAR | Status: AC
  Administered 2011-03-18: 1 g via INTRAVENOUS
  Filled 2011-03-18: qty 1000

## 2011-03-18 MED ORDER — HYDROMORPHONE HCL PF 1 MG/ML IJ SOLN: 0.2500 mg | INTRAMUSCULAR | Status: DC | PRN

## 2011-03-18 SURGICAL SUPPLY — 50 items
CANISTER SUCTION 2500CC (MISCELLANEOUS) ×3 IMPLANT
CATH ROBINSON RED A/P 10FR (CATHETERS) IMPLANT
CLEANER TIP ELECTROSURG 2X2 (MISCELLANEOUS) ×3 IMPLANT
CLOTH BEACON ORANGE TIMEOUT ST (SAFETY) ×3 IMPLANT
COAGULATOR SUCT SWTCH 10FR 6 (ELECTROSURGICAL) IMPLANT
CONT SPEC 4OZ CLIKSEAL STRL BL (MISCELLANEOUS) ×9 IMPLANT
COVER MAYO STAND STRL (DRAPES) ×3 IMPLANT
COVER TABLE BACK 60X90 (DRAPES) ×3 IMPLANT
CRADLE DONUT ADULT HEAD (MISCELLANEOUS) ×3 IMPLANT
DECANTER SPIKE VIAL GLASS SM (MISCELLANEOUS) ×3 IMPLANT
DRAPE PROXIMA HALF (DRAPES) ×3 IMPLANT
ELECT COATED BLADE 2.86 ST (ELECTRODE) ×3 IMPLANT
ELECT REM PT RETURN 9FT ADLT (ELECTROSURGICAL) ×3
ELECT REM PT RETURN 9FT PED (ELECTROSURGICAL)
ELECTRODE REM PT RETRN 9FT PED (ELECTROSURGICAL) IMPLANT
ELECTRODE REM PT RTRN 9FT ADLT (ELECTROSURGICAL) ×2 IMPLANT
GAUZE SPONGE 4X4 16PLY XRAY LF (GAUZE/BANDAGES/DRESSINGS) ×3 IMPLANT
GLOVE BIO SURGEON STRL SZ 6 (GLOVE) IMPLANT
GLOVE BIO SURGEON STRL SZ7.5 (GLOVE) ×3 IMPLANT
GLOVE BIOGEL PI IND STRL 7.0 (GLOVE) ×2 IMPLANT
GLOVE BIOGEL PI INDICATOR 7.0 (GLOVE) ×1
GLOVE ECLIPSE 6.5 STRL STRAW (GLOVE) ×3 IMPLANT
GLOVE ECLIPSE 8.0 STRL XLNG CF (GLOVE) ×6 IMPLANT
GLOVE SURG SS PI 6.5 STRL IVOR (GLOVE) ×3 IMPLANT
GOWN PREVENTION PLUS XLARGE (GOWN DISPOSABLE) ×3 IMPLANT
GOWN STRL NON-REIN LRG LVL3 (GOWN DISPOSABLE) ×9 IMPLANT
GUARD TEETH (MISCELLANEOUS) ×3 IMPLANT
KIT BASIN OR (CUSTOM PROCEDURE TRAY) ×3 IMPLANT
KIT ROOM TURNOVER OR (KITS) ×3 IMPLANT
MARKER SKIN DUAL TIP RULER LAB (MISCELLANEOUS) IMPLANT
NEEDLE SPNL 22GX3.5 QUINCKE BK (NEEDLE) ×3 IMPLANT
NS IRRIG 1000ML POUR BTL (IV SOLUTION) ×3 IMPLANT
PACK SURGICAL SETUP 50X90 (CUSTOM PROCEDURE TRAY) ×3 IMPLANT
PAD ARMBOARD 7.5X6 YLW CONV (MISCELLANEOUS) ×6 IMPLANT
PATTIES SURGICAL 1X1 (DISPOSABLE) IMPLANT
PENCIL FOOT CONTROL (ELECTRODE) ×3 IMPLANT
SOLUTION ANTI FOG 6CC (MISCELLANEOUS) ×3 IMPLANT
SPECIMEN JAR SMALL (MISCELLANEOUS) IMPLANT
SPONGE GAUZE 4X4 12PLY (GAUZE/BANDAGES/DRESSINGS) ×3 IMPLANT
SPONGE INTESTINAL PEANUT (DISPOSABLE) IMPLANT
SPONGE TONSIL 1 RF SGL (DISPOSABLE) ×3 IMPLANT
SURGILUBE 2OZ TUBE FLIPTOP (MISCELLANEOUS) ×3 IMPLANT
SYR BULB 3OZ (MISCELLANEOUS) ×3 IMPLANT
SYR CONTROL 10ML LL (SYRINGE) ×3 IMPLANT
TOWEL OR 17X24 6PK STRL BLUE (TOWEL DISPOSABLE) IMPLANT
TOWEL OR 17X26 10 PK STRL BLUE (TOWEL DISPOSABLE) ×3 IMPLANT
TRAP SPECIMEN MUCOUS 40CC (MISCELLANEOUS) IMPLANT
TUBE CONNECTING 12X1/4 (SUCTIONS) ×3 IMPLANT
WATER STERILE IRR 1000ML POUR (IV SOLUTION) IMPLANT
YANKAUER SUCT BULB TIP NO VENT (SUCTIONS) ×3 IMPLANT

## 2011-03-18 NOTE — Interval H&P Note (Signed)
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 After consideration of risks, benefits and other options for treatment, the patient has consented to  Procedure(s) (LRB): PANENDOSCOPY (N/A) TONSILLECTOMY (N/A) as a surgical intervention .  The patients' history has been re-reviewed, patient re-examined, no change in status, stable for surgery.  I have reviewed the patients' chart and labs.  Questions were answered to the patient's satisfaction.     Flo Shanks

## 2011-03-18 NOTE — OR Nursing (Addendum)
Base of tongue frozen specimen sent to pathology. Called pathology to notify them that the specimen had been sent. Pathology called the operating room to verify that the specimen had been received.

## 2011-03-18 NOTE — Anesthesia Procedure Notes (Signed)
Procedure Name: Intubation Date/Time: 03/18/2011 10:11 AM Performed by: Quentin Ore Pre-anesthesia Checklist: Patient identified, Emergency Drugs available, Suction available, Patient being monitored and Timeout performed Patient Re-evaluated:Patient Re-evaluated prior to inductionOxygen Delivery Method: Circle system utilized Preoxygenation: Pre-oxygenation with 100% oxygen Intubation Type: IV induction Ventilation: Mask ventilation without difficulty and Oral airway inserted - appropriate to patient size Grade View: Grade I Tube type: Oral Tube size: 7.0 mm Number of attempts: 1 Airway Equipment and Method: Stylet Placement Confirmation: ETT inserted through vocal cords under direct vision,  positive ETCO2 and breath sounds checked- equal and bilateral Secured at: 22 cm Tube secured with: Tape Dental Injury: Teeth and Oropharynx as per pre-operative assessment  Comments: Intubation per Dr. Lazarus Salines after bronchoscopsy.

## 2011-03-18 NOTE — Discharge Instructions (Signed)
Tonsillectomy, Adult Care After Refer to this sheet in the next few weeks. These instructions provide you with information on caring for yourself after your procedure. Your caregiver may also give you specific instructions. Your treatment has been planned according to current medical practices, but problems sometimes occur. Call your caregiver if you have any problems or questions after your procedure. HOME CARE INSTRUCTIONS   Obtain proper rest, keeping your head elevated at all times. You will feel worn out and tired for a while.   Drink plenty of fluids. This reduces pain and hastens the healing process.   Only take over-the-counter or prescription medicines for pain, discomfort, or fever as directed by your caregiver. Do not take aspirin or nonsteroidal anti-inflammatory drugs. These medications increase the possibility of bleeding.   Sometimes the use of pain medication can cause constipation. If this happens, ask your caregiver about laxatives that you can take.   When eating, only eat a small portion of your food and then take your prescribed pain medication. Eat the remainder of your food 45 minutes later. This will make swallowing less painful.   Soft and cold foods, such as gelatin, sherbet, ice cream, frozen ice pops, and cold drinks, are usually the easiest to eat. Several days after surgery, you will be able to eat more solid food.   Avoid mouth washes and gargles.   Avoid contact with people who have upper respiratory infections, such as colds and sore throats.   An ice pack applied to your neck may help with discomfort and keep swelling down.  SEEK MEDICAL CARE IF:   You have increasing pain that is not controlled with medications.   You have an oral temperature above 102 F (38.9 C).   You feel lightheaded or have a fainting spell.   You develop a rash.  SEEK IMMEDIATE MEDICAL CARE IF:   You have difficulty breathing.   You experience side effects or allergic  reactions to medications.   You bleed bright red blood from your throat, or you vomit bright red blood.  MAKE SURE YOU:  Understand these instructions.   Will watch your condition.   Will get help right away if you are not doing well or get worse.  Document Released: 10/23/2003 Document Revised: 12/11/2010 Document Reviewed: 02/27/2010 Exit[employer] Patient Information 2012 [employer]. 

## 2011-03-18 NOTE — Transfer of Care (Signed)
Immediate Anesthesia Transfer of Care Note  Patient: Kyle Hendrix [facility]  Procedure(s) Performed: Procedure(s) (LRB): PANENDOSCOPY (N/A) TONSILLECTOMY (Right)  Patient Location: PACU  Anesthesia Type: General  Level of Consciousness: awake, alert  and oriented  Airway & Oxygen Therapy: Patient Spontanous Breathing and Patient connected to face mask oxygen  Post-op Assessment: Report given to PACU RN and Post -op Vital signs reviewed and stable  Post vital signs: Reviewed and stable  Complications: No apparent anesthesia complications

## 2011-03-18 NOTE — Anesthesia Preprocedure Evaluation (Addendum)
Anesthesia Evaluation  Patient identified by MRN, date of birth, ID band Patient awake    Reviewed: Allergy & Precautions, H&P , NPO status , Patient's Chart, lab work & pertinent test results  Airway Mallampati: II TM Distance: >3 FB Neck ROM: Full    Dental   Pulmonary asthma , sleep apnea ,    Pulmonary exam normal       Cardiovascular     Neuro/Psych Depression    GI/Hepatic   Endo/Other  Morbid obesity  Renal/GU      Musculoskeletal   Abdominal Normal abdominal exam  (+) + obese,   Peds  Hematology   Anesthesia Other Findings   Reproductive/Obstetrics                          Anesthesia Physical Anesthesia Plan  ASA: II  Anesthesia Plan: General   Post-op Pain Management:    Induction: Intravenous  Airway Management Planned: Oral ETT  Additional Equipment:   Intra-op Plan:   Post-operative Plan: Extubation in OR  Informed Consent: I have reviewed the patients History and Physical, chart, labs and discussed the procedure including the risks, benefits and alternatives for the proposed anesthesia with the patient or authorized representative who has indicated his/her understanding and acceptance.     Plan Discussed with: CRNA and Surgeon  Anesthesia Plan Comments:         Anesthesia Quick Evaluation

## 2011-03-18 NOTE — Preoperative (Signed)
Beta Blockers   Reason not to administer Beta Blockers:Not Applicable 

## 2011-03-18 NOTE — Anesthesia Postprocedure Evaluation (Signed)
  Anesthesia Post-op Note  Patient: Kyle Hendrix [facility]  Procedure(s) Performed: Procedure(s) (LRB): PANENDOSCOPY (N/A) TONSILLECTOMY (Right)  Patient Location: PACU  Anesthesia Type: General  Level of Consciousness: awake  Airway and Oxygen Therapy: Patient Spontanous Breathing  Post-op Pain: mild  Post-op Assessment: Post-op Vital signs reviewed, Patient's Cardiovascular Status Stable, Respiratory Function Stable, Patent Airway and Pain level controlled  Post-op Vital Signs: Reviewed and stable  Complications: No apparent anesthesia complications

## 2011-03-18 NOTE — Op Note (Signed)
03/18/2011  11:12 AM   Kyle Hendrix  [id number]   Pre-Op Dx:  RIGHT Neck metastatic lymphadenopathy.  Presumed RIGHT tonsil primary  Post-op Dx: T1N2bMo SCCa RIGHT tonsil,  Uvular papilloma.  Proc: Bronchoscopy, Esophagoscopy, Direct Laryngoscopy with Biopsy, RIGHT tonsillectomy .  Excisional biopsy uvular lesion.  Surg:  Flo Shanks T MD  Anes:  GOT  EBL:  <10 ml  Comp:  none  Findings:  Nl bronchoscopy to level of secondary bronchi.  Nl cervical esophagoscopy.  Nl larynx/hypopharynx.  Lymphoid elements in vallecula/base of tongue, negative by frozen section.  2+ embedded tonsils, soft to palpation but with invasive SCCa by frozen section on RIGHT tonsillectomy specimen.  2 x 2 x 4 cm firm RIGHT level II neck mass.  3-4 mm typical papilloma on the tip of the uvula.  Procedure: With the patient in a comfortable supine position, General mask anesthesia was induced without difficulty.  At an appropriate level, the table was turned 90 degrees away from Anesthesia.  A clean preparation and draping was performed in the standard fashion.  A rubber tooth guard was placed.   Using the [facility] laryngoscope, the larynx was visualized.  The rod bronchoscope was passed through the vocal cords and inspection down into the mainstem bronchus on each side was performed with the findings as described above.  The bronchoscope was removed.  Under direct vision, endotracheal intubation was accomplished without difficulty and anesthesia continued per ETT.  The cervical esophagoscope  was lubricated and inserted into the hypopharynx.  With gentle pressure, it was passed through the cricopharyngeus and advanced to its full length without difficulty with the findings as described above.  It was removed.  The anterior commissure laryngoscope was introduced taking care to protect lips, teeth, and endotracheal tube.  Complete laryngoscopy was performed in the standard fashion.  The findings were as  described above.  Biopsies were taken from the RIGHT vallecula and base of tongue, and sent for frozen section.  Hemostasis was spontaneous.  The laryngoscope was removed.   The oropharynx, oral cavity, nasopharynx and hypopharynx were palpated with findings as described above.  The neck was palpated on both sides with the findings as described above.  The first frozen sections returned as negative.  Tonsillectomy was elected.  The patient was placed in Trendelenberg position.  The rubber tooth guard was removed, and the Crowe-Davis mouth gag was introduced, expanded for visualization, and suspended from the [facility] stand in the standard fashion.  The findings were as described above.  1/2 % xylocaine with 1:200,000 epinephrine, 5 ml was infiltrated in the RIGHT peritonsillar plane.  Several minutes were allowed for hemostasis to take effect.    A punch forceps was used to remove the identified uvular papilloma. Cautery was applied for hemostasis.  The RIGHT tonsil was palpated with no obvious firm areas of irregularities.  It was grasped with an Allis clamp and retracted medially.  Using cutting and coagulating cautery,  The tonsil was dissected on its capsule from the muscular fossa.  The cautery tip was used as a blunt dissector, fibrous bands were lysed as needed, and crossing vessels were coagulated as needed.   The tonsil was removed in its entirety as determined by examination of tonsil and fossa.  It was sent for frozen section.  A small additional quantity of cautery rendered the fossa hemostatic.  The mouth gag was relaxed.  The frozen section returned as positive for SCCa, invasive.  At this point the procedure was completed.  The gag was re-expanded and hemostasis was observed.  The gag was relaxed and removed.  The dental status was intact.  The patient was returned to Anesthesia, awakened, extubated, and transferred to PACU in satisfactory condition.  Dispo:  PACU to ACU for post  anesthetic recovery for concomitant OSA, 23 hr observation.  Plan:  Analgesia, hydration, no strenuous activity x 2 weeks.   Await final path interpretation.  Will need complete healing of recent dental extractions and RIGHT tonsillectomy, then Radiation and Chemo therapy.    Kyle Hendrix, Kyle Hendrix  [id number]

## 2011-03-19 ENCOUNTER — Ambulatory Visit (HOSPITAL_COMMUNITY): Payer: Self-pay | Admitting: Dentistry

## 2011-03-19 VITALS — BP 122/77 | HR 77 | Temp 97.0°F

## 2011-03-19 DIAGNOSIS — K08109 Complete loss of teeth, unspecified cause, unspecified class: Secondary | ICD-10-CM

## 2011-03-19 DIAGNOSIS — K08199 Complete loss of teeth due to other specified cause, unspecified class: Secondary | ICD-10-CM

## 2011-03-19 DIAGNOSIS — Z463 Encounter for fitting and adjustment of dental prosthetic device: Secondary | ICD-10-CM

## 2011-03-19 DIAGNOSIS — Z0189 Encounter for other specified special examinations: Secondary | ICD-10-CM

## 2011-03-19 DIAGNOSIS — K08409 Partial loss of teeth, unspecified cause, unspecified class: Secondary | ICD-10-CM

## 2011-03-19 MED ORDER — HYDROCODONE-ACETAMINOPHEN 7.5-500 MG/15ML PO SOLN: 10.0000 mL | ORAL | Status: AC | PRN

## 2011-03-19 NOTE — Discharge Summary (Signed)
  03/19/2011 10:27 AM  Kyle Hendrix [id number]  Post-Op Day 1, Discharge Note   Temp:  [97.2 F (36.2 C)-98.5 F (36.9 C)] 98.5 F (36.9 C) (03/14 0755) Pulse Rate:  [65-130] 90  (03/14 0800) Resp:  [10-21] 11  (03/14 0800) BP: (100-139)/(62-84) 118/68 mmHg (03/14 0755) SpO2:  [92 %-99 %] 93 % (03/14 0800) Weight:  [105.8 kg (233 lb 4 oz)] 105.8 kg (233 lb 4 oz) (03/13 1600),     Intake/Output Summary (Last 24 hours) at 03/19/11 1027 Last data filed at 03/19/11 0900  Gross per 24 hour  Intake   4921 ml  Output   3625 ml  Net   1296 ml   Excellent po.  Spont void.  No results found for this or any previous visit (from the past 24 hour(s)).  SUBJECTIVE:  No bleeding, no breathing difficulty.  Mild sore throat, relieved by Lortab liquid.  Shared + pathology report for SCCa  OBJECTIVE:  Color, energy good.  Fossa clean and dryl  IMPRESSION:  Satisfactory check  PLAN:  D/C home  Admit: 13 MAR 13  D/C: 14 MAR 13  Final Dx:  Stage IV (T1N2b) Squamous Cell Carcinoma, RIGHT tonsil  Proc:  Panendoscopy with biopsy, RIGHT tonsillectomy, 13 MAR  Comp:  None  Cond:  Ambulatory.  Taking good po.  Pain controlled.  No bleeding or breathing problems  Re check:  1 wk Dr. Lazarus Salines.  Rx:  Lortab liquid.  Instructions written and given.  Flo Shanks

## 2011-03-19 NOTE — Patient Instructions (Signed)
FLUORIDE TRAYS PATIENT INSTRUCTIONS    Obtain prescription from the pharmacy.  Don't be surprised if it needs to be ordered.   Be sure to let the pharmacy know when you are close to needing a new refill for them to have it ready for you without interruption of Fluoride use.   The best time to use your Fluoride is before bed time.   You must brush your teeth very well and floss before using the Fluoride in order to get the best use out of the Fluoride treatments.   Place 1 drop of Fluoride gel per tooth in the tray.   Place the tray on your lower teeth and/or your upper teeth.  Make sure the trays are seated all the way.  Remember, they only fit one way on your teeth.   Insert for 5 full minutes.   At the end of the 5 minutes, take the trays out.  SPIT OUT excess. .    Do NOT rinse your mouth!    Do NOT eat or drink after treatments for at least 30 minutes.  This is why the best time for your treatments is before bedtime.    Clean the inside of your Fluoride trays using COLD WATER and a toothbrush.    In order to keep your Trays from discoloring and free from odors, soak them overnight in denture cleaners such as Efferdent.  Do not use bleach or non denture products.    Store the trays in a safe dry place AWAY from any heat until your next treatment.    Bring the trays with you for your next dental check-up.  The dentist will confirm their fit.    If anything happens to your Fluoride trays, or they don't fit as well after any dental work, please let us know as soon as possible.  TRISMUS  Trismus is a condition where the jaw does not allow the mouth to open as wide as it usually does.  This can happen almost suddenly, or in other cases the process is so slow, it is hard to notice it-until it is too far along.  When the jaw joints and/or muscles have been exposed to radiation treatments, the onset of Trismus is very slow.  This is because the muscles are losing  their stretching ability over a long period of time, as long as 2 YEARS after the end of radiation.  It is therefore important to exercise these muscles and joints.  TRISMUS EXERCISES   Stack of tongue depressors measuring the same or a little less than the last documented MIO (Maximum Interincisal Opening).  Secure them with a rubber band on both ends.  Place the stack in the patient's mouth, supporting the other end.  Allow 30 seconds for muscle stretching.  Rest for a few seconds.  Repeat 3-5 times  For all radiation patients, this exercise is recommended in the mornings and evenings unless otherwise instructed.  The exercise should be done for a period of 2 YEARS after the end of radiation.  MIO should be checked routinely on recall dental visits by the general dentist or the hospital dentist.  The patient is advised to report any changes, soreness, or difficulties encountered when doing the exercises. 

## 2011-03-19 NOTE — Progress Notes (Signed)
Thursday, March 19, 2011   BP: 122/77          P:  77        T: 97.0  Patient Active Problem List  Diagnoses  . Sleep apnea  . 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 Allergy  . Asthma  . Right tonsillar squamous cell carcinoma   Allergies  Allergen Reactions  . Contrast Media (Iodinated Diagnostic Agents) Anaphylaxis    Not recommended due to severe Iodine allergy.  . Iodine Anaphylaxis  . Shellfish Allergy Anaphylaxis  . Betadine (Povidone Iodine) Rash  . Avelox (Moxifloxacin Hcl In Nacl) Hives  . Morphine And Related Hives  . Prednisone Other (See Comments)    Altered mental status  . Ultram (Tramadol Hcl) Hives    Demetra Shiner presents for insertion of upper and lower fluoride trays and Scatter protection devices. Patient had extractions on 03/16/11, Direct laryngoscopy and right tonsillectomy on 3/13 (Pathology positive for SCCa of right tonsil) and previously had feeding tube placed. Patient discharged from hospital earlier this morning. Patient to see his primary Dentist for restoration #20 with cleaning next week by patient report.   Exam: Loss tooth #'s 2,15,18,19,30, 31. Extraction sites appear to be healing in well. No signs of infection, heme,or ooze. Sutures are intact. Right tonsil consistent with tonsillectomy.  Procedure: Appliances tried in and were adjusted prn. [place]. Trismus device fabricated as well with maximum interincisal opening of 48 mm and use of 28 sticks for trismus device. Post op instructions provided on use and care of appliances. All questions answered. RTC for periodic oral examination during radiation therapy in three to four weeks. Call if questions or problems before then. Patient dismissed in stable condition.  Dr. Cindra Eves

## 2011-03-19 NOTE — Progress Notes (Signed)
INITIAL ADULT NUTRITION ASSESSMENT Date: 03/19/2011   Time: 10:52 AM  Reason for Assessment: Nutrition Risk Report  ASSESSMENT: Male 46 y.o.  Dx: R neck metastatic lymphadenopathy  Hx:  Past Medical History  Diagnosis Date  . Nasal polyps   . 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 Depression   . Fatigue   . Degeneration of lumbar intervertebral disc   . Allergy     seasonal  . Asthma     seasonal  . 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 TMJ (dislocation of temporomandibular joint)   . Sleep apnea    Past Surgical History  Procedure Date  . Anterior release vertebral body w/ posterior fusion     lumbar  . Back surgery 09/1999    L4-5 fusion  . Multiple tooth extractions March 2013    6 teeth removed  . Peg placement March 2013    Placed at [facility] long   Related Meds:     . bacitracin  1 application Topical Q8H  . fentaNYL      . lisdexamfetamine  70 mg Oral Daily  . sertraline  100 mg Oral QHS  . DISCONTD: dexamethasone (DECADRON) IVPB  8 mg Intravenous Once  . DISCONTD: HYDROcodone-acetaminophen       Ht: 5\' 9"  (175.3 cm)  Wt: 233 lb 4 oz (105.8 kg)  Ideal Wt: 72.7 kg % Ideal Wt: 146%  Wt Readings from Last 10 Encounters:  03/18/11 233 lb 4 oz (105.8 kg)  03/18/11 233 lb 4 oz (105.8 kg)  03/11/11 239 lb 12.8 oz (108.773 kg)  03/04/11 231 lb (104.781 kg)  03/04/11 233 lb 11.2 oz (106.006 kg)  Usual Wt: 230 - 240 lb per patient % Usual Wt: 100%  Body mass index is 34.44 kg/(m^2). Pt meets criteria for Obesity Class I.  Food/Nutrition Related Hx: appetite WNL  Labs:  CMP     Component Value Date/Time   NA 141 03/11/2011 1553   K 3.6 03/11/2011 1553   CL 103 03/11/2011 1553   CO2 26 03/11/2011 1553   GLUCOSE 91 03/11/2011 1553   BUN 8 03/11/2011 1553   CREATININE 0.88 03/11/2011 1553   CALCIUM 9.5 03/11/2011 1553   PROT 8.3 03/04/2011 1131   ALBUMIN 5.0 03/04/2011 1131   AST 30 03/04/2011 1131   ALT 43 03/04/2011  1131   ALKPHOS 80 03/04/2011 1131   BILITOT 0.6 03/04/2011 1131   GFRNONAA >90 03/11/2011 1553   GFRAA >90 03/11/2011 1553   Intake/Output I/O last 3 completed shifts: In: 4361 [P.O.:1560; I.V.:2801] Out: 3625 [Urine:3625] Total I/O In: 560 [P.O.:360; I.V.:200] Out: -   Diet Order: General  Supplements/Tube Feeding: none  IVF:    sodium chloride Last Rate: 1,000 mL (03/18/11 2321)   Estimated Nutritional Needs:   Kcal: 2400 - 2600 kcal Protein: 110 - 130 grams protein Fluid:  2.2 - 2.4 L/d  Admitted for R neck metastatic lymphadenopathy 2/2 presumed TxN2b M0 SCCA RIGHT Tonsil. Per MD, it may be 2-3 weeks until pt can begin radiation. Noted past surgical hx of PEG placement earlier this month. Has not used PEG for nutrition support as of yet.  Pt being seen by Vernell Leep, oncology RD. Pt had no specific questions regarding PO intake. States he tolerated dinner last night and breakfast this morning without difficulty.  NUTRITION DIAGNOSIS: -Increased nutrient needs (NI-5.1).  Status: Ongoing  RELATED TO: catabolic illness  AS EVIDENCE BY: estimated needs  MONITORING/EVALUATION(Goals):  Goal: Pt to consume >/= 75% of meals and supplements. Monitor: PO intake, weights, labs, I/O's  EDUCATION NEEDS: -Education needs addressed  INTERVENTION: 1. Provided handout on High Calorie and High Protein Nutrition Therapy as well as coupons for Ensure products. Pt declined supplements at this time, stated he is trying to consume as much solid "regular" food as able and stated he will be discharging later today. States that he has talked to oncology RD in depth about weight maintenance. Encouraged patient to continue to follow up with outpatient RD.   Dietitian #: [phone]  DOCUMENTATION CODES Per approved criteria  -Obesity Unspecified    Adair Laundry 03/19/2011, 10:52 AM

## 2011-03-20 ENCOUNTER — Ambulatory Visit: Payer: BC Managed Care – PPO | Admitting: Oncology

## 2011-03-23 ENCOUNTER — Encounter (HOSPITAL_COMMUNITY): Payer: Self-pay | Admitting: Otolaryngology

## 2011-03-24 ENCOUNTER — Encounter (HOSPITAL_BASED_OUTPATIENT_CLINIC_OR_DEPARTMENT_OTHER): Payer: Self-pay | Admitting: *Deleted

## 2011-03-24 ENCOUNTER — Telehealth: Payer: Self-pay | Admitting: *Deleted

## 2011-03-24 ENCOUNTER — Emergency Department (HOSPITAL_BASED_OUTPATIENT_CLINIC_OR_DEPARTMENT_OTHER)
Admission: EM | Admit: 2011-03-24 | Discharge: 2011-03-24 | Disposition: A | Discharge status: Home / Self Care | Payer: BC Managed Care – PPO | Attending: Emergency Medicine | Admitting: Emergency Medicine

## 2011-03-24 DIAGNOSIS — R197 Diarrhea, unspecified: Secondary | ICD-10-CM | POA: Insufficient documentation

## 2011-03-24 DIAGNOSIS — E860 Dehydration: Secondary | ICD-10-CM | POA: Insufficient documentation

## 2011-03-24 DIAGNOSIS — F329 Major depressive disorder, single episode, unspecified: Secondary | ICD-10-CM | POA: Insufficient documentation

## 2011-03-24 DIAGNOSIS — Z79899 Other long term (current) drug therapy: Secondary | ICD-10-CM | POA: Insufficient documentation

## 2011-03-24 DIAGNOSIS — R112 Nausea with vomiting, unspecified: Secondary | ICD-10-CM | POA: Insufficient documentation

## 2011-03-24 DIAGNOSIS — M51379 Other intervertebral disc degeneration, lumbosacral region without mention of lumbar back pain or lower extremity pain: Secondary | ICD-10-CM | POA: Insufficient documentation

## 2011-03-24 DIAGNOSIS — J45909 Unspecified asthma, uncomplicated: Secondary | ICD-10-CM | POA: Insufficient documentation

## 2011-03-24 DIAGNOSIS — R109 Unspecified abdominal pain: Secondary | ICD-10-CM | POA: Insufficient documentation

## 2011-03-24 DIAGNOSIS — M5137 Other intervertebral disc degeneration, lumbosacral region: Secondary | ICD-10-CM | POA: Insufficient documentation

## 2011-03-24 DIAGNOSIS — F3289 Other specified depressive episodes: Secondary | ICD-10-CM | POA: Insufficient documentation

## 2011-03-24 DIAGNOSIS — F988 Other specified behavioral and emotional disorders with onset usually occurring in childhood and adolescence: Secondary | ICD-10-CM | POA: Insufficient documentation

## 2011-03-24 LAB — BASIC METABOLIC PANEL
CO2: 26 mEq/L (ref 19–32)
Chloride: 98 mEq/L (ref 96–112)
Sodium: 135 mEq/L (ref 135–145)

## 2011-03-24 LAB — CBC
HCT: 41.2 % (ref 39.0–52.0)
Platelets: 313 10*3/uL (ref 150–400)
RDW: 12.6 % (ref 11.5–15.5)
WBC: 8 10*3/uL (ref 4.0–10.5)

## 2011-03-24 LAB — HEPATIC FUNCTION PANEL
AST: 26 U/L (ref 0–37)
Albumin: 4.4 g/dL (ref 3.5–5.2)
Bilirubin, Direct: 0.1 mg/dL (ref 0.0–0.3)

## 2011-03-24 LAB — URINALYSIS, ROUTINE W REFLEX MICROSCOPIC
Leukocytes, UA: NEGATIVE
Nitrite: NEGATIVE
Specific Gravity, Urine: 1.025 (ref 1.005–1.030)
pH: 5.5 (ref 5.0–8.0)

## 2011-03-24 LAB — DIFFERENTIAL
Basophils Absolute: 0 10*3/uL (ref 0.0–0.1)
Lymphocytes Relative: 22 % (ref 12–46)
Neutro Abs: 5.4 10*3/uL (ref 1.7–7.7)

## 2011-03-24 LAB — LIPASE, BLOOD: Lipase: 21 U/L (ref 11–59)

## 2011-03-24 MED ORDER — SODIUM CHLORIDE 0.9 % IV BOLUS (SEPSIS): 1000.0000 mL | Freq: Once | INTRAVENOUS | Status: DC

## 2011-03-24 MED ORDER — ONDANSETRON HCL 4 MG/2ML IJ SOLN
4.0000 mg | Freq: Once | INTRAMUSCULAR | Status: AC
  Administered 2011-03-24: 4 mg via INTRAVENOUS
  Filled 2011-03-24: qty 2

## 2011-03-24 MED ORDER — SODIUM CHLORIDE 0.9 % IV BOLUS (SEPSIS)
1000.0000 mL | Freq: Once | INTRAVENOUS | Status: AC
  Administered 2011-03-24: 1000 mL via INTRAVENOUS

## 2011-03-24 NOTE — Telephone Encounter (Addendum)
PT. HAS HAD NAUSEA AND VOMITING FOR TWO DAYS. HE HAS VOMITED SEVEN TIMES IN THE PAST 24 HOURS. FLUID INTAKE IN THE PAST 24 HOURS HAS BEEN 180CC. PT.'S WIFE CALLED DR.WOLICKI'S OFFICE AT 9:00AM AND HAS NOT RECEIVED A RETURNED CALLED. THIS NOTE TO DR.SHADAD. VERBAL ORDER AND READ BACK TO DR.SHADAD- PT.TO TRY THE ATIVAN FOR NAUSEA. PT. TO GO TO THE EMERGENCY ROOM TO BE EVALUATED. NOTIFIED PT.'S WIFE OF ABOVE ORDERS.SHE VOICES UNDERSTANDING.

## 2011-03-24 NOTE — ED Notes (Signed)
Sipping ginger ale second liter bolus of normal saline started.

## 2011-03-24 NOTE — Discharge Instructions (Signed)
Clear Liquid Diet The clear liquid dietconsists of foods that are liquid or will become liquid at room temperature.You should be able to see through the liquid and beverages. Examples of foods allowed on a clear liquid diet include fruit juice, broth or bouillon, gelatin, or frozen ice pops. The purpose of this diet is to provide necessary fluid, electrolytes such as sodium and potassium, and energy to keep the body functioning during times when you are not able to consume a regular diet.A clear liquid diet should not be continued for long periods of time as it is not nutritionally adequate.  REASONS FOR USING A CLEAR LIQUID DIET  In sudden onset (acute) conditions for a patient before or after surgery.   As the first step in oral feeding.   For fluid and electrolyte replacement in diarrheal diseases.   As a diet before certain medical tests are performed.  ADEQUACY The clear liquid diet is adequate only in ascorbic acid, according to the Recommended Dietary Allowances of the [employer]. CHOOSING FOODS Breads and Starches  Allowed:  None are allowed.   Avoid: All are avoided.  Vegetables  Allowed:  Strained tomato or vegetable juice.   Avoid: Any others.  Fruit  Allowed:  Strained fruit juices and fruit drinks. Include 1 serving of citrus or vitamin C-enriched fruit juice daily.   Avoid: Any others.  Meat and Meat Substitutes  Allowed:  None are allowed.   Avoid: All are avoided.  Milk  Allowed:  None are allowed.   Avoid: All are avoided.  Soups and Combination Foods  Allowed:  Clear bouillon, broth, or strained broth-based soups.   Avoid: Any others.  Desserts and Sweets  Allowed:  Sugar, honey. High protein gelatin. Flavored gelatin, ices, or frozen ice pops that do not contain milk.   Avoid: Any others.  Fats and Oils  Allowed:  None are allowed.   Avoid: All are avoided.  Beverages  Allowed: Cereal beverages, coffee (regular or  decaffeinated), tea, or soda at the discretion of your caregiver.   Avoid: Any others.  Condiments  Allowed:  Iodized salt.   Avoid: Any others, including pepper.  Supplements  Allowed:  Liquid nutrition beverages.   Avoid: Any others that contain lactose or fiber.  SAMPLE MEAL PLAN Breakfast  4 oz (120 mL) strained orange juice.    to 1 cup (125 to 250 mL) gelatin (plain or fortified).   1 cup (250 mL) beverage (coffee or tea).   Sugar, if desired.  Midmorning Snack   cup (125 mL) gelatin (plain or fortified).  Lunch  1 cup (250 mL) broth or consomm.   4 oz (120 mL) strained grapefruit juice.    cup (125 mL) gelatin (plain or fortified).   1 cup (250 mL) beverage (coffee or tea).   Sugar, if desired.  Midafternoon Snack   cup (125 mL) fruit ice.    cup (125 mL) strained fruit juice.  Dinner  1 cup (250 mL) broth or consomm.    cup (125 mL) cranberry juice.    cup (125 mL) flavored gelatin (plain or fortified).   1 cup (250 mL) beverage (coffee or tea).   Sugar, if desired.  Evening Snack  4 oz (120 mL) strained apple juice (vitamin C-fortified).    cup (125 mL) flavored gelatin (plain or fortified).  Document Released: 12/22/2004 Document Revised: 12/11/2010 Document Reviewed: 03/21/2010 [facility] Patient Information 2012 [place], [place].  Dehydration, Adult Dehydration is when you lose more fluids from  the body than you take in. Vital organs like the kidneys, brain, and heart cannot function without a proper amount of fluids and salt. Any loss of fluids from the body can cause dehydration.  CAUSES   Vomiting.   Diarrhea.   Excessive sweating.   Excessive urine output.   Fever.  SYMPTOMS  Mild dehydration  Thirst.   Dry lips.   Slightly dry mouth.  Moderate dehydration  Very dry mouth.   Sunken eyes.   Skin does not bounce back quickly when lightly pinched and released.   Dark urine and decreased urine production.    Decreased tear production.   Headache.  Severe dehydration  Very dry mouth.   Extreme thirst.   Rapid, weak pulse (more than 100 beats per minute at rest).   Cold hands and feet.   Not able to sweat in spite of heat and temperature.   Rapid breathing.   Blue lips.   Confusion and lethargy.   Difficulty being awakened.   Minimal urine production.   No tears.  DIAGNOSIS  Your caregiver will diagnose dehydration based on your symptoms and your exam. Blood and urine tests will help confirm the diagnosis. The diagnostic evaluation should also identify the cause of dehydration. TREATMENT  Treatment of mild or moderate dehydration can often be done at home by increasing the amount of fluids that you drink. It is best to drink small amounts of fluid more often. Drinking too much at one time can make vomiting worse. Refer to the home care instructions below. Severe dehydration needs to be treated at the hospital where you will probably be given intravenous (IV) fluids that contain water and electrolytes. HOME CARE INSTRUCTIONS   Ask your caregiver about specific rehydration instructions.   Drink enough fluids to keep your urine clear or pale yellow.   Drink small amounts frequently if you have nausea and vomiting.   Eat as you normally do.   Avoid:   Foods or drinks high in sugar.   Carbonated drinks.   Juice.   Extremely hot or cold fluids.   Drinks with caffeine.   Fatty, greasy foods.   Alcohol.   Tobacco.   Overeating.   Gelatin desserts.   Wash your hands well to avoid spreading bacteria and viruses.   Only take over-the-counter or prescription medicines for pain, discomfort, or fever as directed by your caregiver.   Ask your caregiver if you should continue all prescribed and over-the-counter medicines.   Keep all follow-up appointments with your caregiver.  SEEK MEDICAL CARE IF:  You have abdominal pain and it increases or stays in one area  (localizes).   You have a rash, stiff neck, or severe headache.   You are irritable, sleepy, or difficult to awaken.   You are weak, dizzy, or extremely thirsty.  SEEK IMMEDIATE MEDICAL CARE IF:   You are unable to keep fluids down or you get worse despite treatment.   You have frequent episodes of vomiting or diarrhea.   You have blood or green matter (bile) in your vomit.   You have blood in your stool or your stool looks black and tarry.   You have not urinated in 6 to 8 hours, or you have only urinated a small amount of very dark urine.   You have a fever.   You faint.  MAKE SURE YOU:   Understand these instructions.   Will watch your condition.   Will get help right away if you are not  doing well or get worse.  Document Released: 12/22/2004 Document Revised: 12/11/2010 Document Reviewed: 08/11/2010 [facility] Patient Information 2012 [place], [place].

## 2011-03-24 NOTE — ED Notes (Signed)
Pt. Had tonsil removal 6 days ago the R tonsil.  Biopsy of esophagus tongue and mouth.  Pt. Also has a feeding tube placed 12 days ago.  Pt. Also has had 6 teeth removed last Monday.  Pt. Is alert and oriented in triage but complaining of pain 5/10 in his abd.  Pt. Does eat some by mouth and uses the feeding tube.

## 2011-03-24 NOTE — ED Provider Notes (Signed)
History     CSN: [id number]  Arrival date & time 03/24/11  1358   First MD Initiated Contact with Patient 03/24/11 1506      Chief Complaint  Patient presents with  . Nausea    Pt. reports nausea and vomiting with diarrhea today.  Vomited x 7 today and diarrhea x 5    (Consider location/radiation/quality/duration/timing/severity/associated sxs/prior treatment) HPI  H/o esophageal and tonsillar ca pw weakness. C/O NV since yesterday. &x NBNB emesis since yesterday. Has been taking zofran with relief. Three episodes of loose stools.  Denies fever +chills. No sick contacts. In the last two hours reports tingling/bursning sensation upper abdomen, feels like "soreness". Denies rash. No recent travel. States that within one week he has had tonsillectomy and 12 days ago 6 molars removed so he has been eating less. This is in prep for chemo/radiation therapy. States that he also had recent PEG tube placed in prep for radiation. Has been placing water in PEG tube but vomiting that as well. No other abd surgeries   ED Notes, ED Provider Notes from 03/24/11 0000 to 03/24/11 14:14:43       Misty Little Earlene Plater, RN 03/24/2011 14:07      Pt. Had tonsil removal 6 days ago the R tonsil. Biopsy of esophagus tongue and mouth. Pt. Also has a feeding tube placed 12 days ago. Pt. Also has had 6 teeth removed last Monday. Pt. Is alert and oriented in triage but complaining of pain 5/10 in his abd. Pt. Does eat some by mouth and uses the feeding tube.   Oncologist- Dr. Milinda Pointer, Dr. Mitzi Hansen (rad onc)-- [facility]   Past Medical History  Diagnosis Date  . Nasal polyps   . 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 Depression   . Fatigue   . Degeneration of lumbar intervertebral disc   . Allergy     seasonal  . Asthma     seasonal  . 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 TMJ (dislocation of temporomandibular joint)   . Sleep apnea     Past Surgical History  Procedure Date    . Anterior release vertebral body w/ posterior fusion     lumbar  . Back surgery 09/1999    L4-5 fusion  . Multiple tooth extractions March 2013    6 teeth removed  . Peg placement March 2013    Placed at [place] [facility]  . Panendoscopy 03/18/2011    Procedure: PANENDOSCOPY;  Surgeon: Flo Shanks, MD;  Location: [facility] OR;  Service: ENT;  Laterality: N/A;  PANDENDONOSCOPY  . Tonsillectomy 03/18/2011    Procedure: TONSILLECTOMY;  Surgeon: Flo Shanks, MD;  Location: [facility] OR;  Service: ENT;  Laterality: Right;    Family History  Problem Relation Age of Onset  . Diabetes Mother   . Hypertension Mother   . Stroke Maternal Grandfather     History  Substance Use Topics  . Smoking status: Never Smoker   . Smokeless tobacco: Former Neurosurgeon    Types: Chew    Quit date: 03/04/1999  . Alcohol Use: Yes     socially      Review of Systems  All other systems reviewed and are negative.   except as noted HPI  Allergies  Contrast media; Iodine; Shellfish allergy; Betadine; Avelox; Morphine and related; Prednisone; and Ultram  Home Medications   Current Outpatient Rx  Name Route Sig Dispense Refill  . PROCHLORPERAZINE 25 MG RE SUPP Rectal Place 25 mg  rectally every 12 (twelve) hours as needed.    Marland Kitchen HYDROCODONE-ACETAMINOPHEN 7.5-500 MG/15ML PO SOLN Oral Take 10-20 mLs by mouth every 4 (four) hours as needed. 360 mL 1  . LISDEXAMFETAMINE DIMESYLATE 70 MG PO CAPS Oral Take 70 mg by mouth every morning.    Marland Kitchen LORAZEPAM 0.5 MG PO TABS Oral Take 1 tablet (0.5 mg total) by mouth every 6 (six) hours as needed (Nausea or vomiting). 30 tablet 0  . SERTRALINE HCL 100 MG PO TABS Oral Take 100 mg by mouth at bedtime.     . SULFAMETHOXAZOLE-TMP DS 800-160 MG PO TABS Oral Take 1 tablet by mouth 2 (two) times daily.      BP 134/78  Pulse 92  Temp(Src) 98.3 F (36.8 C) (Oral)  Resp 20  Ht 5\' 8"  (1.727 m)  Wt 232 lb (105.235 kg)  BMI 35.28 kg/m2  SpO2 96%  Physical Exam  Nursing note and vitals  reviewed. Constitutional: He is oriented to person, place, and time. He appears well-developed and well-nourished. No distress.  HENT:  Head: Atraumatic.       MM dry  Eyes: Conjunctivae are normal. Pupils are equal, round, and reactive to light.  Neck: Neck supple.  Cardiovascular: Normal rate, regular rhythm, normal heart sounds and intact distal pulses.  Exam reveals no gallop and no friction rub.   No murmur heard. Pulmonary/Chest: Effort normal. No respiratory distress. He has no wheezes. He has no rales.  Abdominal: Soft. Bowel sounds are normal. He exhibits no distension. There is no tenderness. There is no rebound and no guarding.  Musculoskeletal: Normal range of motion. He exhibits no edema and no tenderness.  Neurological: He is alert and oriented to person, place, and time.  Skin: Skin is warm and dry.  Psychiatric: He has a normal mood and affect.    ED Course  Procedures (including critical care time)  Labs Reviewed  BASIC METABOLIC PANEL - Abnormal; Notable for the following:    Glucose, Bld 110 (*)    GFR calc non Af Amer 72 (*)    GFR calc Af Amer 84 (*)    All other components within normal limits  URINALYSIS, ROUTINE W REFLEX MICROSCOPIC - Abnormal; Notable for the following:    Ketones, ur 15 (*)    All other components within normal limits  CBC  DIFFERENTIAL  LIPASE, BLOOD  HEPATIC FUNCTION PANEL   No results found.   1. Dehydration   2. Nausea vomiting and diarrhea     MDM  N/V/D. Appears to be mildly dehydrated. Abdomen benign. Check labs, U/A. IVF, zofran. Reassess for likely discharge home.  Pt states he feels much better after 2L IVF. Denies abdominal pain. Ok with discharge home. To f/u with [facility] as needed. Precautions for return.        Forbes Cellar, MD 03/24/11 [phone]

## 2011-03-25 ENCOUNTER — Telehealth: Payer: Self-pay | Admitting: *Deleted

## 2011-03-25 ENCOUNTER — Ambulatory Visit (HOSPITAL_BASED_OUTPATIENT_CLINIC_OR_DEPARTMENT_OTHER): Payer: BC Managed Care – PPO | Admitting: Oncology

## 2011-03-25 ENCOUNTER — Ambulatory Visit: Payer: BC Managed Care – PPO

## 2011-03-25 ENCOUNTER — Other Ambulatory Visit: Payer: Self-pay | Admitting: Lab

## 2011-03-25 VITALS — BP 119/80 | HR 110 | Temp 98.8°F | Ht 68.0 in | Wt 226.2 lb

## 2011-03-25 DIAGNOSIS — E441 Mild protein-calorie malnutrition: Secondary | ICD-10-CM

## 2011-03-25 DIAGNOSIS — Z931 Gastrostomy status: Secondary | ICD-10-CM

## 2011-03-25 DIAGNOSIS — Z87891 Personal history of nicotine dependence: Secondary | ICD-10-CM

## 2011-03-25 DIAGNOSIS — C099 Malignant neoplasm of tonsil, unspecified: Secondary | ICD-10-CM

## 2011-03-25 NOTE — Progress Notes (Signed)
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   CURRENT THERAPY:  Due to start concurrent chemoradiation with q3wk Cisplatin 100mg /m2 and daily radiation in the next   INTERVAL HISTORY: Kyle Hendrix 46 y.o. male returns to go over final plan of treatment.  He had a few teeth extracted about 1 wk ago.  He developed some nausea/vomiting for a few days after dental extraction.  He required IVF in ED yesterday.  He had right tonsil resection; he noticed that his right cervical node has slightly enlarged.  He had PEG tube placement and has been flushing it without problem.    Patient denies fatigue, headache, visual changes, confusion, drenching night sweats, mucositis, odynophagia, dysphagia, nausea vomiting, jaundice, chest pain, palpitation, shortness of breath, dyspnea on exertion, productive cough, gum bleeding, epistaxis, hematemesis, hemoptysis, abdominal pain, abdominal swelling, early satiety, melena, hematochezia, hematuria, skin rash, spontaneous bleeding, joint swelling, joint pain, heat or cold intolerance, bowel bladder incontinence, back pain, focal motor weakness, paresthesia, depression, suicidal or homocidal ideation, feeling hopelessness.   Past Medical History  Diagnosis Date  . Nasal polyps   . 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 Depression   . Fatigue   . Degeneration of lumbar intervertebral disc   . Allergy     seasonal  . Asthma     seasonal  . 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 TMJ (dislocation of temporomandibular joint)   . Sleep apnea     Past Surgical History  Procedure Date  . Anterior release vertebral body w/ posterior fusion     lumbar  . Back surgery 09/1999    L4-5 fusion  . Multiple tooth extractions March  2013    6 teeth removed  . Peg placement March 2013    Placed at [place] [facility]  . Panendoscopy 03/18/2011    Procedure: PANENDOSCOPY;  Surgeon: Flo Shanks, MD;  Location: [facility] OR;  Service: ENT;  Laterality: N/A;  PANDENDONOSCOPY  . Tonsillectomy 03/18/2011    Procedure: TONSILLECTOMY;  Surgeon: Flo Shanks, MD;  Location: [facility] OR;  Service: ENT;  Laterality: Right;    Current Outpatient Prescriptions  Medication Sig Dispense Refill  . HYDROcodone-acetaminophen (LORTAB) 7.5-500 MG/15ML solution Take 10-20 mLs by mouth every 4 (four) hours as needed.  360 mL  1  . lisdexamfetamine (VYVANSE) 70 MG capsule Take 70 mg by mouth every morning.      Marland Kitchen LORazepam (ATIVAN) 0.5 MG tablet Take 1 tablet (0.5 mg total) by mouth every 6 (six) hours as needed (Nausea or vomiting).  30 tablet  0  . ondansetron (ZOFRAN) 8 MG tablet Take 8 mg by mouth every 12 (twelve) hours as needed.      . prochlorperazine (COMPAZINE) 25 MG suppository Place 25 mg rectally every 12 (twelve) hours as needed.      . sertraline (ZOLOFT) 100 MG tablet Take 100 mg by mouth at bedtime.       . sulfamethoxazole-trimethoprim (BACTRIM DS) 800-160 MG per tablet Take 1 tablet by mouth 2 (two) times daily.       No current facility-administered medications for this visit.   Facility-Administered Medications Ordered in Other Visits  Medication Dose Route Frequency Provider Last Rate Last Dose  . ondansetron (ZOFRAN) injection 4 mg  4 mg Intravenous Once  Forbes Cellar, MD   4 mg at 03/24/11 1531  . sodium chloride 0.9 % bolus 1,000 mL  1,000 mL Intravenous Once Forbes Cellar, MD   1,000 mL at 03/24/11 1530  . DISCONTD: sodium chloride 0.9 % bolus 1,000 mL  1,000 mL Intravenous Once Forbes Cellar, MD        ALLERGIES:  is allergic to contrast media; iodine; shellfish allergy; betadine; avelox; morphine and related; prednisone; and ultram.  REVIEW OF SYSTEMS:  The rest of the 14-point review of system was negative.   Filed Vitals:     03/25/11 0844  BP: 119/80  Pulse: 110  Temp: 98.8 F (37.1 C)   Wt Readings from Last 3 Encounters:  03/25/11 226 lb 3.2 oz (102.604 kg)  03/24/11 232 lb (105.235 kg)  03/18/11 233 lb 4 oz (105.8 kg)   ECOG Performance status: 0  PHYSICAL EXAMINATION:   General: well-nourished in no acute distress. Eyes: no scleral icterus. ENT: There were no oropharyngeal lesions on my unaided exam.  There was right tonsillar bad erythema from recent right tonsillectomy.  Neck was without thyromegaly. Lymphatics: positive for about 3x3cm right level II node. There was no supraclavicular or axillary adenopathy. Respiratory: lungs were clear bilaterally without wheezing or crackles. Cardiovascular: Regular rate and rhythm, S1/S2, without murmur, rub or gallop. There was no pedal edema. GI: abdomen was soft, flat, nontender, nondistended, without organomegaly.  PEG tube in place, dry, clean, intact.   Muscoloskeletal: no spinal tenderness of palpation of vertebral spine. Skin exam was without echymosis, petichae. Neuro exam was nonfocal. Patient was able to get on and off exam table without assistance. Gait was normal. Patient was alerted and oriented. Attention was good. Language was appropriate. Mood was normal without depression. Speech was not pressured. Thought content was not tangential.    LABORATORY/RADIOLOGY DATA:  Lab Results  Component Value Date   WBC 8.0 03/24/2011   HGB 14.5 03/24/2011   HCT 41.2 03/24/2011   PLT 313 03/24/2011   GLUCOSE 110* 03/24/2011   ALT 43 03/24/2011   AST 26 03/24/2011   NA 135 03/24/2011   K 4.4 03/24/2011   CL 98 03/24/2011   CREATININE 1.20 03/24/2011   BUN 16 03/24/2011   CO2 26 03/24/2011    ASSESSMENT AND PLAN:   1.  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   Mr. Space again today confirmed his wish in proceeding with definitive chemoradiation therapy for  this cancer.  He would like to go with cisplatin as standard of care.  He does want to enroll in clinical trial RTOG 1016.  We again went over side effects of cisplatin which include but not limited to nausea vomiting, alopecia, fatigue, mucositis, ototoxicity, nephrotoxicity, abnormal electrolytes, cytopenia, risk of bleeding and infection.   In preparation for chemo, he has already had PEG tube placement.  He will return to Dr. Retta Mac tomorrow to get clearance before CT sim.  He already attended chemo class. He has Compazine/Zofran/Ativen prn nausea/vomiting.  His baseline audiogram was within normal range.  He will attend speech pathology session for swallowing exercise this week.  2.  Mucositis prevention:  I advised him on salt/baking soda mouth rinse at least 4x/day.   3.  Mild calorie/protein malnutrition:  I advised him to increase oral intake including using Ensure/Boost prior to starting treatment.  He has f/u with Vernell Leep.  He does not need  to resort to his PEG tube yet since his weight has not decreased that much.   4.  Follow up:  Start to chemo TBD by Rad Onc.  I will see him one week after first dose of chemoradiation.   The length of time of the face-to-face encounter was 15 minutes. More than 50% of time was spent counseling and coordination of care.

## 2011-03-25 NOTE — Telephone Encounter (Signed)
Returned call from pt who states he had teeth extracted on 03/16/11 w/Dr Mohorn and had a tonsillectomy w/biopsy on 03/18/11, Dr Lazarus Salines. Pt saw Dr Gaylyn Rong today and is inquiring about when he will be scheduled for simulation for radiation tx. Informed pt Dr Mitzi Hansen in [facility] this afternoon but in office tomorrow and Fri.  Will route pt's question to dr, and he will get call back w/appt. Pt verbalized understanding.

## 2011-03-27 ENCOUNTER — Other Ambulatory Visit: Payer: Self-pay | Admitting: Oncology

## 2011-03-27 ENCOUNTER — Ambulatory Visit: Payer: BC Managed Care – PPO | Attending: Radiation Oncology

## 2011-03-27 ENCOUNTER — Ambulatory Visit
Admission: RE | Admit: 2011-03-27 | Discharge: 2011-03-27 | Disposition: A | Discharge status: Home / Self Care | Payer: BC Managed Care – PPO | Source: Ambulatory Visit | Attending: Radiation Oncology | Admitting: Radiation Oncology

## 2011-03-27 DIAGNOSIS — R1310 Dysphagia, unspecified: Secondary | ICD-10-CM | POA: Insufficient documentation

## 2011-03-27 DIAGNOSIS — C099 Malignant neoplasm of tonsil, unspecified: Secondary | ICD-10-CM

## 2011-03-27 DIAGNOSIS — IMO0001 Reserved for inherently not codable concepts without codable children: Secondary | ICD-10-CM | POA: Insufficient documentation

## 2011-03-31 ENCOUNTER — Other Ambulatory Visit: Payer: Self-pay | Admitting: Oncology

## 2011-03-31 ENCOUNTER — Telehealth: Payer: Self-pay | Admitting: Oncology

## 2011-03-31 NOTE — Telephone Encounter (Signed)
called pt and provided appt for 04/02.  informed pt to picj april and may scheduled at that time

## 2011-03-31 NOTE — Progress Notes (Signed)
Received office notes from Dr. Bland Span @ [facility] ENT; forwarded to Dr. Gaylyn Rong.

## 2011-04-07 ENCOUNTER — Ambulatory Visit
Admission: RE | Admit: 2011-04-07 | Discharge: 2011-04-07 | Disposition: A | Discharge status: Home / Self Care | Payer: BC Managed Care – PPO | Source: Ambulatory Visit | Attending: Radiation Oncology | Admitting: Radiation Oncology

## 2011-04-07 ENCOUNTER — Ambulatory Visit (HOSPITAL_BASED_OUTPATIENT_CLINIC_OR_DEPARTMENT_OTHER): Payer: BC Managed Care – PPO

## 2011-04-07 ENCOUNTER — Ambulatory Visit: Payer: BC Managed Care – PPO

## 2011-04-07 ENCOUNTER — Other Ambulatory Visit: Payer: Self-pay | Admitting: Oncology

## 2011-04-07 ENCOUNTER — Encounter: Payer: Self-pay | Admitting: Radiation Oncology

## 2011-04-07 VITALS — BP 126/80 | HR 82 | Temp 97.8°F

## 2011-04-07 VITALS — BP 126/86 | HR 93 | Resp 18 | Wt 233.6 lb

## 2011-04-07 DIAGNOSIS — Z5111 Encounter for antineoplastic chemotherapy: Secondary | ICD-10-CM

## 2011-04-07 DIAGNOSIS — C099 Malignant neoplasm of tonsil, unspecified: Secondary | ICD-10-CM

## 2011-04-07 DIAGNOSIS — C801 Malignant (primary) neoplasm, unspecified: Secondary | ICD-10-CM

## 2011-04-07 MED ORDER — PALONOSETRON HCL INJECTION 0.25 MG/5ML
0.2500 mg | Freq: Once | INTRAVENOUS | Status: AC
  Administered 2011-04-07: 0.25 mg via INTRAVENOUS

## 2011-04-07 MED ORDER — SODIUM CHLORIDE 0.9 % IV SOLN
100.0000 mg/m2 | Freq: Once | INTRAVENOUS | Status: AC
  Administered 2011-04-07: 224 mg via INTRAVENOUS
  Filled 2011-04-07: qty 224

## 2011-04-07 MED ORDER — MANNITOL 25 % IV SOLN
Freq: Once | INTRAVENOUS | Status: AC
  Administered 2011-04-07: 09:00:00 via INTRAVENOUS
  Filled 2011-04-07: qty 10

## 2011-04-07 MED ORDER — SODIUM CHLORIDE 0.9 % IV SOLN
150.0000 mg | Freq: Once | INTRAVENOUS | Status: AC
  Administered 2011-04-07: 150 mg via INTRAVENOUS
  Filled 2011-04-07: qty 5

## 2011-04-07 MED ORDER — DEXAMETHASONE SODIUM PHOSPHATE 4 MG/ML IJ SOLN
12.0000 mg | Freq: Once | INTRAMUSCULAR | Status: AC
  Administered 2011-04-07: 12 mg via INTRAVENOUS

## 2011-04-07 NOTE — Progress Notes (Signed)
Weekly Management Note Current Dose: 2.12 Gy  Projected Dose: 69.96 Gy   Narrative:  The patient presents for routine under treatment assessment.  CBCT/MVCT images/Port film x-rays were reviewed.  The chart was checked. Doing well. No complaints except some streching feelings in the right tonsil.   Physical Findings:  Unchanged  Vitals:  Filed Vitals:   04/07/11 1642  BP: 126/86  Pulse: 93  Resp: 18   Weight:  Wt Readings from Last 3 Encounters:  04/07/11 233 lb 9.6 oz (105.96 kg)  03/25/11 226 lb 3.2 oz (102.604 kg)  03/24/11 232 lb (105.235 kg)   Lab Results  Component Value Date   WBC 8.0 03/24/2011   HGB 14.5 03/24/2011   HCT 41.2 03/24/2011   MCV 85.5 03/24/2011   PLT 313 03/24/2011   Lab Results  Component Value Date   CREATININE 1.20 03/24/2011   BUN 16 03/24/2011   NA 135 03/24/2011   K 4.4 03/24/2011   CL 98 03/24/2011   CO2 26 03/24/2011     Impression:  The patient is tolerating radiation.  Plan:  Continue treatment as planned. Post sim education tomorrow. Discussed side effects of treatment.

## 2011-04-07 NOTE — Progress Notes (Signed)
1040- Pt had emend and decadron infusing and suddenly had a c/o being flushed, hot and having pain in his pelvis and lower back. Vital signs stable. Infusion was stopped and Normal saline was started. Jerelyn Charles, NP was informed. She assessed pt and he then was stating that he was starting to feel better. She ordered to cont back with the pre meds just run them at half the rate (187ml/hr).  4540- Pt voided prior to administration to chemotherapy. Pt voided after administration of chemotherapy.

## 2011-04-07 NOTE — Patient Instructions (Signed)
[facility] Discharge Instructions for Patients Receiving Chemotherapy  Today you received the following chemotherapy agents: Cisplatin.  To help prevent nausea and vomiting after your treatment, we encourage you to take your nausea medication as prescribed by your physician.   If you develop nausea and vomiting that is not controlled by your nausea medication, call the clinic.    BELOW ARE SYMPTOMS THAT SHOULD BE REPORTED IMMEDIATELY:  *FEVER GREATER THAN 100.5 F  *CHILLS WITH OR WITHOUT FEVER  NAUSEA AND VOMITING THAT IS NOT CONTROLLED WITH YOUR NAUSEA MEDICATION  *UNUSUAL SHORTNESS OF BREATH  *UNUSUAL BRUISING OR BLEEDING  TENDERNESS IN MOUTH AND THROAT WITH OR WITHOUT PRESENCE OF ULCERS  *URINARY PROBLEMS  *BOWEL PROBLEMS  UNUSUAL RASH Items with * indicate a potential emergency and should be followed up as soon as possible.  One of the nurses will contact you 24 hours after your treatment. Please let the nurse know about any problems that you may have experienced. Feel free to call the clinic you have any questions or concerns. The clinic phone number is [phone].

## 2011-04-07 NOTE — Progress Notes (Signed)
Patient presents to the clinic today unaccompanied for under treat visit with Dr. Michell Heinrich. Patient is alert and oriented to person, place, and time. No distress noted. Steady gait noted. Pleasant affect noted. Patient denies pain at this time. Patient reports his gums are tender. Patient reports he is able to eat more food but, avoids chips/sharp/hard objects. Patient reports that last week in Dr. Lodema Pilot office he weighed 226. Weight today 233.6 lb. Patient reports that today while emend and decadron was infusing he experienced a flush and hot feeling. Also, he expressed low back and pelvic spasms so Dr. Gaylyn Rong slowed the infusion and the symptoms resolved. Patient reports that he only uses his PEG tube at this point to flush; he isn't instilling any supplements yet. Reported all findings to Dr. Michell Heinrich.

## 2011-04-08 ENCOUNTER — Ambulatory Visit
Admission: RE | Admit: 2011-04-08 | Discharge: 2011-04-08 | Disposition: A | Discharge status: Home / Self Care | Payer: BC Managed Care – PPO | Source: Ambulatory Visit | Attending: Radiation Oncology | Admitting: Radiation Oncology

## 2011-04-08 ENCOUNTER — Ambulatory Visit: Payer: BC Managed Care – PPO

## 2011-04-08 ENCOUNTER — Telehealth: Payer: Self-pay

## 2011-04-08 NOTE — Telephone Encounter (Signed)
Message copied by Albertha Ghee on Wed Apr 08, 2011 11:04 AM ------      Message from: Dorothea Glassman D      Created: Tue Apr 07, 2011  9:30 AM       Pt is first time Cisplatin. Pt of Dr. Gaylyn Rong. Phone number: [phone].

## 2011-04-08 NOTE — Telephone Encounter (Signed)
Spoke with pt re: 1st Cisplatin yesterday.  Pt reports feeling nauseated and tired. Denies vomiting. Denies constipation or diarrhea.  Has taken anti-emetics and "feels some better."  Denies difficulty with pushing fluids, but "i'm about to try to eat."  Aware of how to reach Dr Lodema Pilot nurse and on call MD if needed.  Verbalized appreciation for call. dph

## 2011-04-09 ENCOUNTER — Ambulatory Visit
Admission: RE | Admit: 2011-04-09 | Discharge: 2011-04-09 | Disposition: A | Discharge status: Home / Self Care | Payer: BC Managed Care – PPO | Source: Ambulatory Visit | Attending: Radiation Oncology | Admitting: Radiation Oncology

## 2011-04-09 DIAGNOSIS — C099 Malignant neoplasm of tonsil, unspecified: Secondary | ICD-10-CM

## 2011-04-09 NOTE — Progress Notes (Signed)
Post sim ed completed w/pt. "Radiation and You" booklet unavailable, will give to pt when it comes in. All questions answered.

## 2011-04-10 ENCOUNTER — Ambulatory Visit
Admission: RE | Admit: 2011-04-10 | Discharge: 2011-04-10 | Disposition: A | Discharge status: Home / Self Care | Payer: BC Managed Care – PPO | Source: Ambulatory Visit | Attending: Radiation Oncology | Admitting: Radiation Oncology

## 2011-04-10 ENCOUNTER — Other Ambulatory Visit (HOSPITAL_BASED_OUTPATIENT_CLINIC_OR_DEPARTMENT_OTHER): Payer: BC Managed Care – PPO | Admitting: Lab

## 2011-04-10 DIAGNOSIS — C099 Malignant neoplasm of tonsil, unspecified: Secondary | ICD-10-CM

## 2011-04-10 LAB — BASIC METABOLIC PANEL
BUN: 15 mg/dL (ref 6–23)
Chloride: 99 mEq/L (ref 96–112)
Glucose, Bld: 117 mg/dL — ABNORMAL HIGH (ref 70–99)
Potassium: 3.5 mEq/L (ref 3.5–5.3)
Sodium: 137 mEq/L (ref 135–145)

## 2011-04-13 ENCOUNTER — Ambulatory Visit (HOSPITAL_BASED_OUTPATIENT_CLINIC_OR_DEPARTMENT_OTHER): Payer: Self-pay | Admitting: Oncology

## 2011-04-13 ENCOUNTER — Encounter: Payer: Self-pay | Admitting: Oncology

## 2011-04-13 ENCOUNTER — Ambulatory Visit
Admission: RE | Admit: 2011-04-13 | Discharge: 2011-04-13 | Disposition: A | Discharge status: Home / Self Care | Payer: BC Managed Care – PPO | Source: Ambulatory Visit | Attending: Radiation Oncology | Admitting: Radiation Oncology

## 2011-04-13 VITALS — BP 121/85 | HR 104 | Temp 97.6°F | Ht 68.0 in | Wt 214.8 lb

## 2011-04-13 DIAGNOSIS — C099 Malignant neoplasm of tonsil, unspecified: Secondary | ICD-10-CM

## 2011-04-13 NOTE — Progress Notes (Signed)
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   CURRENT THERAPY:  Concurrent chemoradiation with q3wk Cisplatin 100mg /m2 and daily radiation. Cisplatin was started on 04/07/11.  INTERVAL HISTORY: Kyle Hendrix 46 y.o. male returns for mid-cycle check today. Had his first dose of Cisplatin on 04/07/11. He has had nausea off an on since his chemo. Has vomited x3 times. Appetite is decreased. States that he has had taste changes and nothing tastes good to him. No mucositis. PEG tube flushes well. Patient is not putting anything through his PEG tube at this time. Using chlorhexadine mouth rinses, but has not yet started to use the salt/baking soda mouth rinses. Denies headaches, tinnitus, visual changes, dysphagia. No abdominal pain. Has mild fatigue, but continues to work. Able to do some yard work yesterday.   Patient denies headache, visual changes, confusion, drenching night sweats, mucositis, odynophagia, dysphagia, jaundice, chest pain, palpitation, shortness of breath, dyspnea on exertion, productive cough, gum bleeding, epistaxis, hematemesis, hemoptysis, abdominal pain, abdominal swelling, early satiety, melena, hematochezia, hematuria, skin rash, spontaneous bleeding, joint swelling, joint pain, heat or cold intolerance, bowel bladder incontinence, back pain, focal motor weakness, paresthesia, depression, suicidal or homocidal ideation, feeling hopelessness.   Past Medical History  Diagnosis Date  . Nasal polyps   . 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 Depression   . Fatigue   . Degeneration of lumbar intervertebral disc   . Allergy     seasonal  . Asthma     seasonal  . 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 TMJ (dislocation of temporomandibular  joint)   . Sleep apnea     Past Surgical History  Procedure Date  . Anterior release vertebral body w/ posterior fusion     lumbar  . Back surgery 09/1999    L4-5 fusion  . Multiple tooth extractions March 2013    6 teeth removed  . Peg placement March 2013    Placed at [facility]  . Panendoscopy 03/18/2011    Procedure: PANENDOSCOPY;  Surgeon: Flo Shanks, MD;  Location: [facility] OR;  Service: ENT;  Laterality: N/A;  PANDENDONOSCOPY  . Tonsillectomy 03/18/2011    Procedure: TONSILLECTOMY;  Surgeon: Flo Shanks, MD;  Location: [facility] OR;  Service: ENT;  Laterality: Right;    Current Outpatient Prescriptions  Medication Sig Dispense Refill  . HYDROcodone-acetaminophen (LORTAB) 7.5-500 MG/15ML solution       . lisdexamfetamine (VYVANSE) 70 MG capsule Take 70 mg by mouth every morning.      Marland Kitchen LORazepam (ATIVAN) 0.5 MG tablet Take 1 tablet (0.5 mg total) by mouth every 6 (six) hours as needed (Nausea or vomiting).  30 tablet  0  . meloxicam (MOBIC) 15 MG tablet       . ondansetron (ZOFRAN) 8 MG tablet Take 8 mg by mouth every 12 (twelve) hours as needed.      . prochlorperazine (COMPAZINE) 10 MG tablet Take 10 mg by mouth every 6 (six) hours as needed.      . sertraline (ZOLOFT) 100 MG tablet Take 100 mg by mouth at bedtime.       . chlorpheniramine-HYDROcodone (TUSSIONEX) 10-8 MG/5ML LQCR         ALLERGIES:  is allergic to contrast media; iodine; shellfish allergy; betadine; avelox; morphine and related; prednisone; and  ultram.  REVIEW OF SYSTEMS:  The rest of the 14-point review of system was negative.   Filed Vitals:   04/13/11 0917  BP: 121/85  Pulse: 104  Temp: 97.6 F (36.4 C)   Wt Readings from Last 3 Encounters:  04/13/11 214 lb 12.8 oz (97.433 kg)  04/07/11 233 lb 9.6 oz (105.96 kg)  03/25/11 226 lb 3.2 oz (102.604 kg)   ECOG Performance status: 0  PHYSICAL EXAMINATION:   General: well-nourished in no acute distress. Eyes: no scleral icterus. ENT: There were no  oropharyngeal lesions on my unaided exam.  There was right tonsillar erythema from recent right tonsillectomy.  Neck was without thyromegaly. Lymphatics: positive for about 3x3cm right level II node. There was no supraclavicular or axillary adenopathy. Respiratory: lungs were clear bilaterally without wheezing or crackles. Cardiovascular: Regular rate and rhythm, S1/S2, without murmur, rub or gallop. There was no pedal edema. GI: abdomen was soft, flat, nontender, nondistended, without organomegaly.  PEG tube in place, dry, clean, intact.   Muscoloskeletal: no spinal tenderness of palpation of vertebral spine. Skin exam was without echymosis, petichae. Neuro exam was nonfocal. Patient was able to get on and off exam table without assistance. Gait was normal. Patient was alerted and oriented. Attention was good. Language was appropriate. Mood was normal without depression. Speech was not pressured. Thought content was not tangential.    LABORATORY/RADIOLOGY DATA:  Lab Results  Component Value Date   WBC 8.0 03/24/2011   HGB 14.5 03/24/2011   HCT 41.2 03/24/2011   PLT 313 03/24/2011   GLUCOSE 117* 04/10/2011   ALT 43 03/24/2011   AST 26 03/24/2011   NA 137 04/10/2011   K 3.5 04/10/2011   CL 99 04/10/2011   CREATININE 1.05 04/10/2011   BUN 15 04/10/2011   CO2 28 04/10/2011    ASSESSMENT AND PLAN:   1.  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 On concurrent q 3 week Cisplatin with XRT. S/p 1 cycle of Cisplatin. Experiencing grade 1 vomiting. Anti-emetic use reviewed. Encouraged patient to drink 1.5-2 L of water daily. Encouraged patient to begin using Ensure either by mouth or via PEG. Renal function stable. Plan for cycle 2 of Cisplatin on 04/28/11.  2.  Nausea/vomiting: Has Zofran/Ativan/Compazine. Reviewed how to take antiemetics.   3.  Mucositis prevention:  Not yet using salt/baking soda rinses. I advised him on salt/baking  soda mouth rinse at least 4x/day.   4.  Protein calorie malnutrition:  Encouraged him to use Ensure by mouth or via PEG.  He has f/u with Vernell Leep this week. Encouraged small frequent meals.   5.  Follow up:  Labs on 04/17/11. Return visit on 04/20/11 for ongoing monitoring of side effects.   The patient was seen and examined with Dr Gaylyn Rong. >90% of plan of care developed by Dr Gaylyn Rong. The length of time of the face-to-face encounter was 15 minutes. More than 50% of time was spent counseling and coordination of care.

## 2011-04-14 ENCOUNTER — Ambulatory Visit
Admission: RE | Admit: 2011-04-14 | Discharge: 2011-04-14 | Disposition: A | Discharge status: Home / Self Care | Payer: BC Managed Care – PPO | Source: Ambulatory Visit | Attending: Radiation Oncology | Admitting: Radiation Oncology

## 2011-04-14 NOTE — Progress Notes (Signed)
Patient called c/o increased episodes of diarrhea; imodium OTC reccommended, per Clenton Pare, NP; called patient with instructions on how often to take med; instructed patient to call if he has increased symptoms; verbalized understanding.

## 2011-04-15 ENCOUNTER — Ambulatory Visit: Payer: BC Managed Care – PPO | Admitting: Nutrition

## 2011-04-15 ENCOUNTER — Ambulatory Visit
Admission: RE | Admit: 2011-04-15 | Discharge: 2011-04-15 | Disposition: A | Discharge status: Home / Self Care | Payer: BC Managed Care – PPO | Source: Ambulatory Visit | Attending: Radiation Oncology | Admitting: Radiation Oncology

## 2011-04-15 ENCOUNTER — Ambulatory Visit: Payer: BC Managed Care – PPO

## 2011-04-15 NOTE — Progress Notes (Signed)
Mr. Grainger  presents to nutrition followup. It has been roughly 4 weeks since I have seen him.  He reports episodes of nausea and vomiting and taste changes that are inhibiting his oral intake.  He has begun to use Ensure Plus through his tube in varying amounts and is consuming smoothies and soft foods as tolerated.  His weight was documented today as 212.6 pounds, which is decreased from 231 pounds on 03/27.    NUTRITION DIAGNOSIS:  Predicted suboptimal energy intake has evolved into inadequate oral intake related to diagnosis of tonsil cancer with associated nausea, vomiting, and taste alterations as evidenced by an 8% weight loss in 6 weeks.  INTERVENTION:  I have educated the patient on appropriate soft, moist foods and liquids as tolerated.  I have provided strategies on dealing with thick saliva and nausea and vomiting.  I have recommended the patient use 2 cans of Ensure Plus t.i.d. with 60 mL of water before and after each bolus feeding to total six cans of tube feeding daily which provides 2100 calories and 78 g of protein.  I have also instructed the patient to flush 300 mL of free water t.i.d. through his tube.  Total free water provided will be 2340 mL considering both free water in tube feeding and free water flushes.  Patient is to also add 40 grams of additional protein daily via his feeding tube or by mouth as tolerated bringing protein levels up to about 118 g.  I have reviewed appropriate protein supplements and provided him with samples.  This is just under his minimum needs.  He is able to continue to tolerate some smoothies and I have encouraged this while he is able to swallow.  When he no longer can take food by mouth, we will have to increase his nutrition support.  MONITORING/EVALUATION/GOALS:  The patient will tolerate tube feedings plus oral diet to minimize further weight loss.  NEXT VISIT:  Tuesday, April 23rd, during  chemotherapy.    ______________________________ Zenovia Jarred, RD, LDN Clinical Nutrition Specialist BN/MEDQ  D:  04/15/2011  T:  04/15/2011  Job:  [id number]

## 2011-04-16 ENCOUNTER — Ambulatory Visit
Admission: RE | Admit: 2011-04-16 | Discharge: 2011-04-16 | Disposition: A | Discharge status: Home / Self Care | Payer: BC Managed Care – PPO | Source: Ambulatory Visit | Attending: Radiation Oncology | Admitting: Radiation Oncology

## 2011-04-16 VITALS — BP 122/80 | HR 105 | Temp 98.5°F | Wt 212.5 lb

## 2011-04-16 DIAGNOSIS — C099 Malignant neoplasm of tonsil, unspecified: Secondary | ICD-10-CM

## 2011-04-16 MED ORDER — BIAFINE EX EMUL
Freq: Two times a day (BID) | CUTANEOUS | Status: DC
  Administered 2011-04-16: 09:00:00 via TOPICAL

## 2011-04-16 NOTE — Progress Notes (Signed)
Department of Radiation Oncology  Phone:  [phone] Fax:        [phone]  Weekly Treatment Note    Name: Kyle Hendrix Date: 04/16/2011 MRN: [id number] DOB: [birth year]   Current dose: 16.96 gray  Current fraction: 8   MEDICATIONS: Current Outpatient Prescriptions  Medication Sig Dispense Refill  . chlorpheniramine-HYDROcodone (TUSSIONEX) 10-8 MG/5ML LQCR       . HYDROcodone-acetaminophen (LORTAB) 7.5-500 MG/15ML solution       . Loperamide HCl (IMODIUM PO) Take by mouth. Take 2 tablets after first loose stool, then 1 tablets after each additional loose stool. 8 tablets daily, maximum.      Marland Kitchen LORazepam (ATIVAN) 0.5 MG tablet Take 1 tablet (0.5 mg total) by mouth every 6 (six) hours as needed (Nausea or vomiting).  30 tablet  0  . meloxicam (MOBIC) 15 MG tablet       . ondansetron (ZOFRAN) 8 MG tablet Take 8 mg by mouth every 12 (twelve) hours as needed.      . prochlorperazine (COMPAZINE) 10 MG tablet Take 10 mg by mouth every 6 (six) hours as needed.      . sertraline (ZOLOFT) 100 MG tablet Take 100 mg by mouth at bedtime.       Marland Kitchen lisdexamfetamine (VYVANSE) 70 MG capsule Take 70 mg by mouth every morning.       Current Facility-Administered Medications  Medication Dose Route Frequency Provider Last Rate Last Dose  . topical emolient (BIAFINE) emulsion   Topical BID Jonna Coup, MD         ALLERGIES: Contrast media; Iodine; Shellfish allergy; Betadine; Avelox; Morphine and related; Prednisone; and Ultram   LABORATORY DATA:  Lab Results  Component Value Date   WBC 8.0 03/24/2011   HGB 14.5 03/24/2011   HCT 41.2 03/24/2011   MCV 85.5 03/24/2011   PLT 313 03/24/2011   Lab Results  Component Value Date   NA 137 04/10/2011   K 3.5 04/10/2011   CL 99 04/10/2011   CO2 28 04/10/2011   Lab Results  Component Value Date   ALT 43 03/24/2011   AST 26 03/24/2011   ALKPHOS 72 03/24/2011   BILITOT 0.4 03/24/2011     NARRATIVE: Kyle Hendrix was seen today for  weekly treatment management. The chart was checked and the patient's films were reviewed. The patient has lost a lot of weight. From the values given to me he is lost approximately 21 pounds in the last 9 days. The patient has seen the nutritionist and I had a long conversation with them about this issue as well. The patient is having some thickened saliva and change in taste as well as decreased appetite  PHYSICAL EXAMINATION: weight is 212 lb 8 oz (96.389 kg). His oral temperature is 98.5 F (36.9 C). His blood pressure is 122/80 and his pulse is 105.      no significant skin desquamation or major changes at this point  ASSESSMENT: The patient is doing satisfactorily with treatment.  PLAN: We will continue with the patient's radiation treatment as planned. The patient will use his feeding tube as well as taking and what he can by mouth. He has some targets for this. The patient also will use salt and soda rinses to help clear out his mouth. Also did have a long conversation with him about the fact that his tumor is driven by HPV and what this means in terms of both exposure and the dormancy period.

## 2011-04-16 NOTE — Progress Notes (Signed)
Pt states n/v past 3 mornings before he eats breakfast. He attributes this to gagging on thick saliva when he is gargling w/salt H2O. Taking Zofran q8 hr, Ativan 15-30 min ac meals, Compazine prn.  Saw dietician yesterday and started Ensure 6 cans plus protein 40 gms daily po or peg tube. Orthostatic VS wnl. Gave pt Biafine w/instructions for proper use.

## 2011-04-17 ENCOUNTER — Other Ambulatory Visit (HOSPITAL_BASED_OUTPATIENT_CLINIC_OR_DEPARTMENT_OTHER): Payer: BC Managed Care – PPO

## 2011-04-17 ENCOUNTER — Ambulatory Visit
Admission: RE | Admit: 2011-04-17 | Discharge: 2011-04-17 | Disposition: A | Discharge status: Home / Self Care | Payer: BC Managed Care – PPO | Source: Ambulatory Visit | Attending: Radiation Oncology | Admitting: Radiation Oncology

## 2011-04-17 ENCOUNTER — Other Ambulatory Visit: Payer: Self-pay | Admitting: *Deleted

## 2011-04-17 DIAGNOSIS — C099 Malignant neoplasm of tonsil, unspecified: Secondary | ICD-10-CM

## 2011-04-17 LAB — BASIC METABOLIC PANEL
CO2: 33 mEq/L — ABNORMAL HIGH (ref 19–32)
Calcium: 9.3 mg/dL (ref 8.4–10.5)
Chloride: 91 mEq/L — ABNORMAL LOW (ref 96–112)
Sodium: 133 mEq/L — ABNORMAL LOW (ref 135–145)

## 2011-04-17 MED ORDER — DIPHENHYD-HYDROCORT-NYSTATIN MT SUSP: 15.0000 mL | Freq: Four times a day (QID) | OROMUCOSAL | Status: DC | PRN

## 2011-04-17 NOTE — Telephone Encounter (Signed)
Pt in lobby c/o increase in mouth soreness and requests Rx for MMW.  Examined pt's mouth in office and no signs of thrush.  Mucositis apparent esp on bilat edges of pt's tongue.  Reported to Dr. Gaylyn Rong and  MMW ordered electronically to [facility] outpt pharm per pt request.  Instructed pt to continue to use baking soda rinses at least 4 times daily and he verbalized understanding.

## 2011-04-20 ENCOUNTER — Encounter: Payer: Self-pay | Admitting: Oncology

## 2011-04-20 ENCOUNTER — Ambulatory Visit
Admission: RE | Admit: 2011-04-20 | Discharge: 2011-04-20 | Disposition: A | Discharge status: Home / Self Care | Payer: BC Managed Care – PPO | Source: Ambulatory Visit | Attending: Radiation Oncology | Admitting: Radiation Oncology

## 2011-04-20 ENCOUNTER — Ambulatory Visit (HOSPITAL_BASED_OUTPATIENT_CLINIC_OR_DEPARTMENT_OTHER): Payer: BC Managed Care – PPO | Admitting: Oncology

## 2011-04-20 VITALS — BP 106/76 | HR 98 | Temp 98.0°F | Ht 68.0 in | Wt 214.1 lb

## 2011-04-20 DIAGNOSIS — C099 Malignant neoplasm of tonsil, unspecified: Secondary | ICD-10-CM

## 2011-04-20 DIAGNOSIS — E441 Mild protein-calorie malnutrition: Secondary | ICD-10-CM

## 2011-04-20 DIAGNOSIS — B977 Papillomavirus as the cause of diseases classified elsewhere: Secondary | ICD-10-CM

## 2011-04-20 NOTE — Progress Notes (Signed)
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   CURRENT THERAPY:  Concurrent chemoradiation with q3wk Cisplatin 100mg /m2 and daily radiation. Cisplatin was started on 04/07/11.  INTERVAL HISTORY: Kyle Hendrix 46 y.o. male returns for mid-cycle check today. Had his first dose of Cisplatin on 04/07/11. States that he has had taste changes and nothing tastes good to him. Mild throat pain, but still able to eat soft foods. Using Ensure through PEG 6 cans/day. Typically gives himself 2 cans at a time. Feels bloated afterwards and occasionally vomits. Does not like to drink the Ensure by mouth because he does not like the taste. PEG tube flushes well. Using salt/baking soda rinses, but has not been routinely using the water/peroxide rinses due to burning. Denies headaches, tinnitus, visual changes, dysphagia. No abdominal pain. Has mild fatigue, but continues to work. Able to do some yard work yesterday.   Patient denies headache, visual changes, confusion, drenching night sweats, mucositis, odynophagia, dysphagia, jaundice, chest pain, palpitation, shortness of breath, dyspnea on exertion, productive cough, gum bleeding, epistaxis, hematemesis, hemoptysis, abdominal pain, abdominal swelling, early satiety, melena, hematochezia, hematuria, skin rash, spontaneous bleeding, joint swelling, joint pain, heat or cold intolerance, bowel bladder incontinence, back pain, focal motor weakness, paresthesia, depression, suicidal or homocidal ideation, feeling hopelessness.   Past Medical History  Diagnosis Date  . Nasal polyps   . 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 Depression   . Fatigue   . Degeneration of lumbar intervertebral disc   . Allergy     seasonal  . Asthma     seasonal  .  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 TMJ (dislocation of temporomandibular joint)   . Sleep apnea     Past Surgical History  Procedure Date  . Anterior release vertebral body w/ posterior fusion     lumbar  . Back surgery 09/1999    L4-5 fusion  . Multiple tooth extractions March 2013    6 teeth removed  . Peg placement March 2013    Placed at [facility]  . Panendoscopy 03/18/2011    Procedure: PANENDOSCOPY;  Surgeon: Flo Shanks, MD;  Location: [facility] OR;  Service: ENT;  Laterality: N/A;  PANDENDONOSCOPY  . Tonsillectomy 03/18/2011    Procedure: TONSILLECTOMY;  Surgeon: Flo Shanks, MD;  Location: [facility] OR;  Service: ENT;  Laterality: Right;    Current Outpatient Prescriptions  Medication Sig Dispense Refill  . chlorpheniramine-HYDROcodone (TUSSIONEX) 10-8 MG/5ML LQCR       . Diphenhyd-Hydrocort-Nystatin SUSP Use as directed 15 mLs in the mouth or throat 4 (four) times daily as needed (Swish, gargle and spit as needed for mouth sores).  500 mL  3  . HYDROcodone-acetaminophen (LORTAB) 7.5-500 MG/15ML solution       . lisdexamfetamine (VYVANSE) 70 MG capsule Take 70 mg by mouth every morning.      . Loperamide HCl (IMODIUM PO) Take by mouth. Take 2 tablets after first loose stool, then 1 tablets after each additional loose stool. 8 tablets daily, maximum.      Marland Kitchen LORazepam (ATIVAN) 0.5 MG tablet Take 1 tablet (0.5 mg total) by mouth every 6 (six) hours as needed (Nausea or vomiting).  30 tablet  0  . meloxicam (MOBIC) 15 MG tablet       .  ondansetron (ZOFRAN) 8 MG tablet Take 8 mg by mouth every 12 (twelve) hours as needed.      . prochlorperazine (COMPAZINE) 10 MG tablet Take 10 mg by mouth every 6 (six) hours as needed.      . sertraline (ZOLOFT) 100 MG tablet Take 100 mg by mouth at bedtime.         ALLERGIES:  is allergic to contrast media; iodine; shellfish allergy; betadine; avelox; morphine and related; prednisone; and ultram.  REVIEW OF SYSTEMS:  The rest of the  14-point review of system was negative.   Filed Vitals:   04/20/11 0920  BP: 106/76  Pulse: 98  Temp: 98 F (36.7 C)   Wt Readings from Last 3 Encounters:  04/20/11 214 lb 1.6 oz (97.115 kg)  04/16/11 212 lb 8 oz (96.389 kg)  04/15/11 212 lb 9.6 oz (96.435 kg)   ECOG Performance status: 0  PHYSICAL EXAMINATION:   General: well-nourished in no acute distress. Eyes: no scleral icterus. ENT: There were no oropharyngeal lesions on my unaided exam.  There was right tonsillar erythema from recent right tonsillectomy.  Neck was without thyromegaly. Lymphatics: positive for about 2.5x2.5cm right level II node. There was no supraclavicular or axillary adenopathy. Respiratory: lungs were clear bilaterally without wheezing or crackles. Cardiovascular: Regular rate and rhythm, S1/S2, without murmur, rub or gallop. There was no pedal edema. GI: abdomen was soft, flat, nontender, nondistended, without organomegaly.  PEG tube in place, dry, clean, intact.   Muscoloskeletal: no spinal tenderness of palpation of vertebral spine. Skin exam was without echymosis, petichae. Neuro exam was nonfocal. Patient was able to get on and off exam table without assistance. Gait was normal. Patient was alerted and oriented. Attention was good. Language was appropriate. Mood was normal without depression. Speech was not pressured. Thought content was not tangential.    LABORATORY/RADIOLOGY DATA:  Lab Results  Component Value Date   WBC 8.0 03/24/2011   HGB 14.5 03/24/2011   HCT 41.2 03/24/2011   PLT 313 03/24/2011   GLUCOSE 105* 04/17/2011   ALT 43 03/24/2011   AST 26 03/24/2011   NA 133* 04/17/2011   K 3.4* 04/17/2011   CL 91* 04/17/2011   CREATININE 0.98 04/17/2011   BUN 13 04/17/2011   CO2 33* 04/17/2011    ASSESSMENT AND PLAN:   1.  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 On concurrent q 3 week Cisplatin with XRT. S/p 1 cycle of  Cisplatin. Experiencing grade 1 vomiting. Anti-emetic use reviewed. Encouraged patient to drink 1.5-2 L of water daily. Encouraged patient to begin using Ensure either by mouth or via PEG. Discussed cutting back to 1.5 cans on Ensure QID to decrease bloating sensation. Renal function stable. Plan for cycle 2 of Cisplatin on 04/28/11.  2.  Nausea/vomiting: Has Zofran/Ativan/Compazine. Reviewed how to take antiemetics.   3.  Mucositis prevention: Continue salt/baking soda rinses and alternate with water and peroxide. If he does not tolerate water and peroxide, then may need to consider 1:1 mixture of viscous lidocaine/Robitussin. The patient prefers to use the peroxide and water at this time.   4.  Protein calorie malnutrition:  Encouraged him to use Ensure by mouth or via PEG. He will cut back to 1.5 cans QID to decrease bloating. Encouraged small frequent meals.   5.  Follow up:  Labs on 04/24/11. Return visit on 04/27/11 prior to chemotherapy scheduled on 04/28/11.  The  patient was seen and examined with Dr Gaylyn Rong. >90% of plan of care developed by Dr Gaylyn Rong. The length of time of the face-to-face encounter was 15 minutes. More than 50% of time was spent counseling and coordination of care.

## 2011-04-20 NOTE — Patient Instructions (Addendum)
1. Head and neck cancer:  - Continue with Cisplatin every 3 weeks - Continue with daily radiation.  2. Mouth rinse: Alternate between the followings:  - salt/baking soda (1 teaspoon of salt; 1 teaspoon of bakign soda; in 1 Liter of water).  - diluted hydrogen peroxide (1 part peroxide to 4 parts water)

## 2011-04-20 NOTE — Progress Notes (Signed)
Patient received one prescription from [facility] on 04/17/11 $16.78,his remaninig balance [facility] $383.22.

## 2011-04-21 ENCOUNTER — Ambulatory Visit
Admission: RE | Admit: 2011-04-21 | Discharge: 2011-04-21 | Disposition: A | Discharge status: Home / Self Care | Payer: BC Managed Care – PPO | Source: Ambulatory Visit | Attending: Radiation Oncology | Admitting: Radiation Oncology

## 2011-04-22 ENCOUNTER — Ambulatory Visit
Admission: RE | Admit: 2011-04-22 | Discharge: 2011-04-22 | Disposition: A | Discharge status: Home / Self Care | Payer: BC Managed Care – PPO | Source: Ambulatory Visit | Attending: Radiation Oncology | Admitting: Radiation Oncology

## 2011-04-23 ENCOUNTER — Encounter (HOSPITAL_COMMUNITY): Payer: Self-pay | Admitting: Emergency Medicine

## 2011-04-23 ENCOUNTER — Ambulatory Visit: Payer: BC Managed Care – PPO | Attending: Radiation Oncology

## 2011-04-23 ENCOUNTER — Ambulatory Visit
Admission: RE | Admit: 2011-04-23 | Discharge: 2011-04-23 | Disposition: A | Discharge status: Home / Self Care | Payer: BC Managed Care – PPO | Source: Ambulatory Visit | Attending: Radiation Oncology | Admitting: Radiation Oncology

## 2011-04-23 ENCOUNTER — Emergency Department (HOSPITAL_COMMUNITY): Payer: BC Managed Care – PPO

## 2011-04-23 ENCOUNTER — Emergency Department (HOSPITAL_COMMUNITY)
Admission: EM | Admit: 2011-04-23 | Discharge: 2011-04-24 | Disposition: A | Discharge status: Home / Self Care | Payer: BC Managed Care – PPO | Attending: Emergency Medicine | Admitting: Emergency Medicine

## 2011-04-23 VITALS — BP 114/75 | HR 102 | Temp 97.8°F | Resp 20 | Wt 210.7 lb

## 2011-04-23 DIAGNOSIS — R1110 Vomiting, unspecified: Secondary | ICD-10-CM

## 2011-04-23 DIAGNOSIS — R112 Nausea with vomiting, unspecified: Secondary | ICD-10-CM | POA: Insufficient documentation

## 2011-04-23 DIAGNOSIS — R1310 Dysphagia, unspecified: Secondary | ICD-10-CM | POA: Insufficient documentation

## 2011-04-23 DIAGNOSIS — E876 Hypokalemia: Secondary | ICD-10-CM | POA: Insufficient documentation

## 2011-04-23 DIAGNOSIS — F329 Major depressive disorder, single episode, unspecified: Secondary | ICD-10-CM | POA: Insufficient documentation

## 2011-04-23 DIAGNOSIS — F3289 Other specified depressive episodes: Secondary | ICD-10-CM | POA: Insufficient documentation

## 2011-04-23 DIAGNOSIS — R197 Diarrhea, unspecified: Secondary | ICD-10-CM | POA: Insufficient documentation

## 2011-04-23 DIAGNOSIS — F988 Other specified behavioral and emotional disorders with onset usually occurring in childhood and adolescence: Secondary | ICD-10-CM | POA: Insufficient documentation

## 2011-04-23 DIAGNOSIS — IMO0001 Reserved for inherently not codable concepts without codable children: Secondary | ICD-10-CM | POA: Insufficient documentation

## 2011-04-23 DIAGNOSIS — E860 Dehydration: Secondary | ICD-10-CM

## 2011-04-23 DIAGNOSIS — J309 Allergic rhinitis, unspecified: Secondary | ICD-10-CM | POA: Insufficient documentation

## 2011-04-23 DIAGNOSIS — C099 Malignant neoplasm of tonsil, unspecified: Secondary | ICD-10-CM

## 2011-04-23 DIAGNOSIS — J302 Other seasonal allergic rhinitis: Secondary | ICD-10-CM

## 2011-04-23 LAB — CBC
HCT: 36.7 % — ABNORMAL LOW (ref 39.0–52.0)
Hemoglobin: 13 g/dL (ref 13.0–17.0)
MCH: 29.9 pg (ref 26.0–34.0)
MCHC: 35.4 g/dL (ref 30.0–36.0)
MCV: 84.4 fL (ref 78.0–100.0)
Platelets: 296 10*3/uL (ref 150–400)
RBC: 4.35 MIL/uL (ref 4.22–5.81)
RDW: 12.6 % (ref 11.5–15.5)
WBC: 5.7 10*3/uL (ref 4.0–10.5)

## 2011-04-23 LAB — DIFFERENTIAL
Basophils Absolute: 0 10*3/uL (ref 0.0–0.1)
Basophils Relative: 0 % (ref 0–1)
Eosinophils Absolute: 0.2 10*3/uL (ref 0.0–0.7)
Eosinophils Relative: 3 % (ref 0–5)
Lymphocytes Relative: 19 % (ref 12–46)
Lymphs Abs: 1.1 10*3/uL (ref 0.7–4.0)
Monocytes Absolute: 0.6 10*3/uL (ref 0.1–1.0)
Monocytes Relative: 10 % (ref 3–12)
Neutro Abs: 3.9 10*3/uL (ref 1.7–7.7)
Neutrophils Relative %: 69 % (ref 43–77)

## 2011-04-23 MED ORDER — HYDROMORPHONE HCL PF 1 MG/ML IJ SOLN
1.0000 mg | Freq: Once | INTRAMUSCULAR | Status: AC
  Administered 2011-04-23: 1 mg via INTRAVENOUS
  Filled 2011-04-23: qty 1

## 2011-04-23 MED ORDER — SODIUM CHLORIDE 0.9 % IV BOLUS (SEPSIS)
1000.0000 mL | Freq: Once | INTRAVENOUS | Status: AC
  Administered 2011-04-23: 1000 mL via INTRAVENOUS

## 2011-04-23 MED ORDER — ONDANSETRON HCL 4 MG/2ML IJ SOLN
4.0000 mg | Freq: Once | INTRAMUSCULAR | Status: AC
  Administered 2011-04-23: 4 mg via INTRAVENOUS
  Filled 2011-04-23: qty 2

## 2011-04-23 NOTE — Progress Notes (Signed)
Pt states he has had 4-5 diarrhea stools since yesterday; has not taken Imodium. He states last time he took prescribed dose of Imodium he didn't have bm x 5 days. Advised he may need to take 1/2 recommended dose today if diarrhea does not stop. Ortho vs wnl. Denies dizziness, light-headedness. Using MMW for mouth sores w/good relief. Very slight pain in throat this morning.

## 2011-04-23 NOTE — Progress Notes (Signed)
   Department of Radiation Oncology  Phone:  [phone] Fax:        [phone]  Weekly Treatment Note    Name: Kyle Hendrix Date: 04/23/2011 MRN: [id number] DOB: February 28, 1965   Current dose: 27.6 gray  Current fraction: 13   MEDICATIONS: Current Outpatient Prescriptions  Medication Sig Dispense Refill  . chlorpheniramine-HYDROcodone (TUSSIONEX) 10-8 MG/5ML LQCR       . Diphenhyd-Hydrocort-Nystatin SUSP Use as directed 15 mLs in the mouth or throat 4 (four) times daily as needed (Swish, gargle and spit as needed for mouth sores).  500 mL  3  . HYDROcodone-acetaminophen (LORTAB) 7.5-500 MG/15ML solution       . lisdexamfetamine (VYVANSE) 70 MG capsule Take 70 mg by mouth every morning.      . Loperamide HCl (IMODIUM PO) Take by mouth. Take 2 tablets after first loose stool, then 1 tablets after each additional loose stool. 8 tablets daily, maximum.      Marland Kitchen LORazepam (ATIVAN) 0.5 MG tablet Take 1 tablet (0.5 mg total) by mouth every 6 (six) hours as needed (Nausea or vomiting).  30 tablet  0  . meloxicam (MOBIC) 15 MG tablet       . ondansetron (ZOFRAN) 8 MG tablet Take 8 mg by mouth every 12 (twelve) hours as needed.      . prochlorperazine (COMPAZINE) 10 MG tablet Take 10 mg by mouth every 6 (six) hours as needed.      . sertraline (ZOLOFT) 100 MG tablet Take 100 mg by mouth at bedtime.          ALLERGIES: Contrast media; Iodine; Shellfish allergy; Betadine; Avelox; Morphine and related; Prednisone; and Ultram   LABORATORY DATA:  Lab Results  Component Value Date   WBC 8.0 03/24/2011   HGB 14.5 03/24/2011   HCT 41.2 03/24/2011   MCV 85.5 03/24/2011   PLT 313 03/24/2011   Lab Results  Component Value Date   NA 133* 04/17/2011   K 3.4* 04/17/2011   CL 91* 04/17/2011   CO2 33* 04/17/2011   Lab Results  Component Value Date   ALT 43 03/24/2011   AST 26 03/24/2011   ALKPHOS 72 03/24/2011   BILITOT 0.4 03/24/2011     NARRATIVE: Kyle Hendrix was seen today for weekly  treatment management. The chart was checked and the patient's films were reviewed. The patient states that he is doing relatively well today. Some slight pain in the throat but no major change. He is using adjuvant mouthwash with good relief. The patient has had some diarrhea over the last day. He has not been using any Imodium.  PHYSICAL EXAMINATION: weight is 210 lb 11.2 oz (95.573 kg). His oral temperature is 97.8 F (36.6 C). His blood pressure is 114/75 and his pulse is 102. His respiration is 20.      the patient's skin looks good. Some mucositis/erythema is emerging in the oral cavity.  ASSESSMENT: The patient is doing satisfactorily with treatment.  PLAN: We will continue with the patient's radiation treatment as planned. The patient will begin Imodium if his loose stools continued. He has been hesitant to do this based on some constipation previously.

## 2011-04-23 NOTE — ED Provider Notes (Signed)
History     CSN: [id number]  Arrival date & time 04/23/11  2126   First MD Initiated Contact with Patient 04/23/11 2134      No chief complaint on file.   HPI: Patient is a 46 y.o. male presenting with vomiting. The history is provided by the patient.  Emesis  This is a recurrent problem. The current episode started 12 to 24 hours ago. The problem has been gradually worsening. Associated symptoms include cough, diarrhea and myalgias. Pertinent negatives include no abdominal pain, no chills, no fever and no headaches.  Pt reports onset of diarrhea yesterday. States was up and down throughout the night w/ mx episodes of diarrhea (approx 8 episodes). Had his 13th radiation tx today and though he usually has some nausea and occasional  vomiting after his tx's nothing like today. After his tx states he started having persistent N/V x 6-8 episodes. States he has seen both bright red streaks of blood in his emesis and  dark blood as well. Irritation in his throat worsening. States he has felt "achy" and feverish as well. Temp prior to arrival 99.4. Pt also states he has had worsening problems with allergies which is causing lots of thick mucous which causes coughing and contributes to vomiting. States he is taking 2 Zrytec a day which helps some. Pt w/ recent dx of cancer of (R) tonsill. Had tonsillectomy on 03/11/2011, peg tube placement on 03/18/2011, removal of 6 teeth on 03/16/2011 and first radiation/chemo tx on 04/07/2011.   Past Medical History  Diagnosis Date  . Nasal polyps   . 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 Depression   . Fatigue   . Degeneration of lumbar intervertebral disc   . Allergy     seasonal  . Asthma     seasonal  . 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 TMJ (dislocation of temporomandibular joint)   . Sleep apnea     Past Surgical History  Procedure Date  . Anterior release vertebral body w/ posterior fusion     lumbar    . Back surgery 09/1999    L4-5 fusion  . Multiple tooth extractions March 2013    6 teeth removed  . Peg placement March 2013    Placed at [place] [facility]  . Panendoscopy 03/18/2011    Procedure: PANENDOSCOPY;  Surgeon: Flo Shanks, MD;  Location: [facility] OR;  Service: ENT;  Laterality: N/A;  PANDENDONOSCOPY  . Tonsillectomy 03/18/2011    Procedure: TONSILLECTOMY;  Surgeon: Flo Shanks, MD;  Location: [facility] OR;  Service: ENT;  Laterality: Right;    Family History  Problem Relation Age of Onset  . Diabetes Mother   . Hypertension Mother   . Stroke Maternal Grandfather     History  Substance Use Topics  . Smoking status: Never Smoker   . Smokeless tobacco: Former Neurosurgeon    Types: Chew    Quit date: 03/04/1999  . Alcohol Use: Yes     socially      Review of Systems  Constitutional: Negative for fever and chills.  HENT: Positive for congestion. Negative for facial swelling.   Eyes: Negative.   Respiratory: Positive for cough. Negative for shortness of breath and wheezing.   Cardiovascular: Negative for chest pain.  Gastrointestinal: Positive for vomiting and diarrhea. Negative for abdominal pain.  Genitourinary: Negative.   Musculoskeletal: Positive for myalgias.  Skin: Negative.   Neurological: Negative.  Negative for headaches.  Hematological: Negative.  Psychiatric/Behavioral: Negative.     Allergies  Contrast media; Iodine; Shellfish allergy; Betadine; Avelox; Morphine and related; Prednisone; and Ultram  Home Medications   Current Outpatient Rx  Name Route Sig Dispense Refill  . HYDROCOD POLST-CPM POLST ER 10-8 MG/5ML PO LQCR      . DIPHENHYD-HYDROCORT-NYSTATIN MT SUSP Mouth/Throat Use as directed 15 mLs in the mouth or throat 4 (four) times daily as needed (Swish, gargle and spit as needed for mouth sores). 500 mL 3  . HYDROCODONE-ACETAMINOPHEN 7.5-500 MG/15ML PO SOLN      . LISDEXAMFETAMINE DIMESYLATE 70 MG PO CAPS Oral Take 70 mg by mouth every morning.    . IMODIUM  PO Oral Take by mouth. Take 2 tablets after first loose stool, then 1 tablets after each additional loose stool. 8 tablets daily, maximum.    Marland Kitchen LORAZEPAM 0.5 MG PO TABS Oral Take 1 tablet (0.5 mg total) by mouth every 6 (six) hours as needed (Nausea or vomiting). 30 tablet 0  . MELOXICAM 15 MG PO TABS      . ONDANSETRON HCL 8 MG PO TABS Oral Take 8 mg by mouth every 12 (twelve) hours as needed.    Marland Kitchen PROCHLORPERAZINE MALEATE 10 MG PO TABS Oral Take 10 mg by mouth every 6 (six) hours as needed.    . SERTRALINE HCL 100 MG PO TABS Oral Take 100 mg by mouth at bedtime.       BP 120/99  Pulse 118  Temp(Src) 98.7 F (37.1 C) (Oral)  Resp 20  SpO2 97%  Physical Exam  Constitutional: He is oriented to person, place, and time. He appears well-developed and well-nourished.  HENT:  Head: Normocephalic and atraumatic.  Eyes: Conjunctivae are normal.  Neck: Neck supple.  Cardiovascular: Normal rate and regular rhythm.   Pulmonary/Chest: Effort normal and breath sounds normal.  Abdominal: Soft. Bowel sounds are normal.  Musculoskeletal: Normal range of motion.  Neurological: He is alert and oriented to person, place, and time.  Skin: Skin is warm and dry.  Psychiatric: He has a normal mood and affect.    ED Course  Procedures  Pt states feeling some better after 1 L NS and medications for nausea and pain. No further episodes of V/D at this time. Discussed findings. Will replace K+ and continue IVF's at least x additional l L NS. Pt agreeable.  Pt noted to be sleeping comfortably on re-eval. No further V/D. Overall findings and clinical impression discussed w/ pt and family. Will plan for d/c home w/ refill for pt's liquid medication for pain and anti-emetics. Pt now voiding. Has tolerated a few sips of cl liq but states throat very sore so not taking PO at this time. Encouraged to continue hydration (and ensure) via peg. Pt agreeable w/ plan.   Labs Reviewed - No data to display No results  found.   No diagnosis found.    MDM  HPI/PE and clinical findings c/w 1. N/V/D (Likely radiation/chemo induced though could have viral component as well, no further episodes after initial episode of vomiting, no focal abd TTP, afebrile) 2. Dehydration (As result of #1, Spec grav 1.030, Ketones 80, pt feeling better and voiding after several hrs and 2+ L NS) 3. Hypokalemia (2.8, replaced) 4. Seasonal allergies (Persistent cough, CXR w/o acute findings, self reported thick secretions, on Zyrtec BID for same)        Leanne Chang, NP 04/26/11 1306

## 2011-04-24 ENCOUNTER — Other Ambulatory Visit (HOSPITAL_BASED_OUTPATIENT_CLINIC_OR_DEPARTMENT_OTHER): Payer: BC Managed Care – PPO | Admitting: Lab

## 2011-04-24 ENCOUNTER — Telehealth: Payer: Self-pay | Admitting: Oncology

## 2011-04-24 ENCOUNTER — Ambulatory Visit
Admission: RE | Admit: 2011-04-24 | Discharge: 2011-04-24 | Disposition: A | Discharge status: Home / Self Care | Payer: BC Managed Care – PPO | Source: Ambulatory Visit | Attending: Radiation Oncology | Admitting: Radiation Oncology

## 2011-04-24 DIAGNOSIS — C099 Malignant neoplasm of tonsil, unspecified: Secondary | ICD-10-CM

## 2011-04-24 LAB — BASIC METABOLIC PANEL
BUN: 15 mg/dL (ref 6–23)
CO2: 24 mEq/L (ref 19–32)
Calcium: 9.7 mg/dL (ref 8.4–10.5)
Chloride: 92 mEq/L — ABNORMAL LOW (ref 96–112)
Creatinine, Ser: 0.87 mg/dL (ref 0.50–1.35)
GFR calc Af Amer: >90 mL/min (ref >90)
GFR calc non Af Amer: >90 mL/min (ref >90)
Glucose, Bld: 100 mg/dL — ABNORMAL HIGH (ref 70–99)
Potassium: 2.8 mEq/L — ABNORMAL LOW (ref 3.5–5.1)
Sodium: 135 mEq/L (ref 135–145)

## 2011-04-24 LAB — URINALYSIS, ROUTINE W REFLEX MICROSCOPIC
Glucose, UA: NEGATIVE mg/dL
Hgb urine dipstick: NEGATIVE
Ketones, ur: >80 mg/dL — AB
Leukocytes, UA: NEGATIVE
Nitrite: NEGATIVE
Protein, ur: 30 mg/dL — AB
Specific Gravity, Urine: 1.03 (ref 1.005–1.030)
Urobilinogen, UA: 0.2 mg/dL (ref 0.0–1.0)
pH: 6 (ref 5.0–8.0)

## 2011-04-24 LAB — CBC WITH DIFFERENTIAL/PLATELET
Eosinophils Absolute: 0.1 10*3/uL (ref 0.0–0.5)
HCT: 33.2 % — ABNORMAL LOW (ref 38.4–49.9)
LYMPH%: 11 % — ABNORMAL LOW (ref 14.0–49.0)
MCHC: 35.2 g/dL (ref 32.0–36.0)
MCV: 86.8 fL (ref 79.3–98.0)
MONO%: 10.6 % (ref 0.0–14.0)
NEUT#: 3.1 10*3/uL (ref 1.5–6.5)
NEUT%: 75.6 % — ABNORMAL HIGH (ref 39.0–75.0)
Platelets: 252 10*3/uL (ref 140–400)
RBC: 3.82 10*6/uL — ABNORMAL LOW (ref 4.20–5.82)
nRBC: 0 % (ref 0–0)

## 2011-04-24 LAB — COMPREHENSIVE METABOLIC PANEL
ALT: 19 U/L (ref 0–53)
BUN: 14 mg/dL (ref 6–23)
CO2: 30 mEq/L (ref 19–32)
Calcium: 8.9 mg/dL (ref 8.4–10.5)
Creatinine, Ser: 0.83 mg/dL (ref 0.50–1.35)
Total Bilirubin: 0.4 mg/dL (ref 0.3–1.2)

## 2011-04-24 LAB — URINE MICROSCOPIC-ADD ON

## 2011-04-24 MED ORDER — ONDANSETRON 8 MG PO TBDP: 8.0000 mg | ORAL_TABLET | Freq: Three times a day (TID) | ORAL | Status: AC | PRN

## 2011-04-24 MED ORDER — GI COCKTAIL ~~LOC~~
30.0000 mL | Freq: Once | ORAL | Status: AC
  Administered 2011-04-24: 30 mL via ORAL
  Filled 2011-04-24: qty 30

## 2011-04-24 MED ORDER — OMEPRAZOLE 20 MG PO CPDR: 20.0000 mg | DELAYED_RELEASE_CAPSULE | Freq: Every day | ORAL | Status: DC

## 2011-04-24 MED ORDER — HYDROCODONE-ACETAMINOPHEN 7.5-500 MG/15ML PO SOLN: 15.0000 mL | ORAL | Status: AC | PRN

## 2011-04-24 MED ORDER — POTASSIUM CHLORIDE 20 MEQ/15ML (10%) PO LIQD
40.0000 meq | Freq: Once | ORAL | Status: AC
  Administered 2011-04-24: 40 meq

## 2011-04-24 MED ORDER — POTASSIUM CHLORIDE 20 MEQ/15ML (10%) PO LIQD
40.0000 meq | Freq: Once | ORAL | Status: DC
  Filled 2011-04-24: qty 30

## 2011-04-24 MED ORDER — PROMETHAZINE HCL 25 MG/ML IJ SOLN
12.5000 mg | Freq: Once | INTRAMUSCULAR | Status: AC
  Administered 2011-04-24: 12.5 mg via INTRAVENOUS
  Filled 2011-04-24: qty 1

## 2011-04-24 MED ORDER — HYDROCOD POLST-CHLORPHEN POLST 10-8 MG/5ML PO LQCR
5.0000 mL | Freq: Once | ORAL | Status: AC
  Administered 2011-04-24: 5 mL
  Filled 2011-04-24: qty 5

## 2011-04-24 NOTE — Discharge Instructions (Signed)
Please read over the instructions below. You were treated tonight for your nausea, vomiting and diarrhea. Though you have had similar episodes with your radiation treatments your current symptoms may also have a viral component as well. You were dehydrated and your potassium was low. You admit to feeling some better after IV fluids and medications for pain and nausea. We have supplemented your potassium as well.  Please continue medications for pain and nausea at home as directed. If the diarrhea returns you can take over the counter Immodium as directed. Take the medication you have for cough as well to prevent coughing episodes which irritate your throat and stimulate vomiting. Keep your scheduled appointment for radiation tomorrow. Be sure to let the physicians know you were seen in the emergency department tonight. Return if symptoms worsen.   Dehydration, Adult Dehydration means your body does not have as much fluid as it needs. Your kidneys, brain, and heart will not work properly without the right amount of fluids and salt.  HOME CARE  Ask your doctor how to replace body fluid losses (rehydrate).   Drink enough fluids to keep your pee (urine) clear or pale yellow.   Drink small amounts of fluids often if you feel sick to your stomach (nauseous) or throw up (vomit).   Eat like you normally do.   Avoid:   Foods or drinks high in sugar.   Bubbly (carbonated) drinks.   Juice.   Very hot or cold fluids.   Drinks with caffeine.   Fatty, greasy foods.   Alcohol.   Tobacco.   Eating too much.   Gelatin desserts.   Wash your hands to avoid spreading germs (bacteria, viruses).   Only take medicine as told by your doctor.   Keep all doctor visits as told.  GET HELP RIGHT AWAY IF:   You cannot drink something without throwing up.   You get worse even with treatment.   Your vomit has blood in it or looks greenish.   Your poop (stool) has blood in it or looks black and  tarry.   You have not peed in 6 to 8 hours.   You pee a small amount of very dark pee.   You have a fever.   You pass out (faint).   You have belly (abdominal) pain that gets worse or stays in one spot (localizes).   You have a rash, stiff neck, or bad headache.   You get easily annoyed, sleepy, or are hard to wake up.   You feel weak, dizzy, or very thirsty.  MAKE SURE YOU:   Understand these instructions.   Will watch your condition.   Will get help right away if you are not doing well or get worse.  Document Released: 10/18/2008 Document Revised: 12/11/2010 Document Reviewed: 08/11/2010 Hypokalemia Hypokalemia means a low potassium level in the blood. Symptoms may include muscle weakness and cramping, fatigue, abdominal pain, vomiting, constipation, or irregularities of the heartbeat. Sometimes hypokalemia is discovered by your caregiver if you are taking certain medicines for high blood pressure or kidney disease.  Potassium is an electrolyte that helps regulate the amount of fluid in the body. It also stimulates muscle contraction and maintains a stable acid-base balance. If potassium levels go too low or too high, your health may be in danger. You are at risk for developing shock, heart, and lung problems. Hypokalemia can occur if you have excessive diarrhea, vomiting, or sweating. Potassium can be lost through your kidneys in the urine.  Certain common medicines can also cause potassium loss, especially water pills (diuretics). The same is possible with cortisone medications or certain types of antibiotics. Low potassium can be dangerous if you are taking certain heart medicines. In diabetes, your potassium may fall after you take insulin, especially if your diabetes had been out of control for a while. In rare cases, potassium may be low because you are not getting enough in your diet.  In adults, a potassium level below 3.5 mEq/L is usually considered low. Hypokalemia can be  treated with potassium supplements taken by mouth and a diet that is high in potassium. Foods with high potassium content are:  Peas, lentils, lima beans, nuts, and dried fruit.   Whole grain and bran cereals and breads.   Fresh fruit and vegetables. Examples include:   Bananas.   Cantaloupe.   Grapefruit.   Oranges.   Tomatoes.   Honeydew melons.   Potatoes.   Peaches.   Orange and tomato juices.   Meats.  See your caregiver as instructed for a follow-up blood test to be sure your potassium is back to normal. SEEK MEDICAL CARE IF:   You have nausea, vomiting, constipation, or abdominal pain.   You have palpitations or irregular heartbeats, chest pain or shortness of breath.   You have muscle cramps or weakness or fatigue.   You have lethargy.  SEEK IMMEDIATE MEDICAL CARE IF:   You have paralysis.   You have confusion or other mental status changes.  Document Released: 12/22/2004 Document Revised: 12/11/2010 Document Reviewed: 04/17/2009 .Nausea and Vomiting Nausea is a sick feeling that often comes before throwing up (vomiting). Vomiting is a reflex where stomach contents come out of your mouth. Vomiting can cause severe loss of body fluids (dehydration). Children and elderly adults can become dehydrated quickly, especially if they also have diarrhea. Nausea and vomiting are symptoms of a condition or disease. It is important to find the cause of your symptoms. CAUSES   Direct irritation of the stomach lining. This irritation can result from increased acid production (gastroesophageal reflux disease), infection, food poisoning, taking certain medicines (such as nonsteroidal anti-inflammatory drugs), alcohol use, or tobacco use.   Signals from the brain.These signals could be caused by a headache, heat exposure, an inner ear disturbance, increased pressure in the brain from injury, infection, a tumor, or a concussion, pain, emotional stimulus, or metabolic problems.     An obstruction in the gastrointestinal tract (bowel obstruction).   Illnesses such as diabetes, hepatitis, gallbladder problems, appendicitis, kidney problems, cancer, sepsis, atypical symptoms of a heart attack, or eating disorders.   Medical treatments such as chemotherapy and radiation.   Receiving medicine that makes you sleep (general anesthetic) during surgery.  DIAGNOSIS Your caregiver may ask for tests to be done if the problems do not improve after a few days. Tests may also be done if symptoms are severe or if the reason for the nausea and vomiting is not clear. Tests may include:  Urine tests.   Blood tests.   Stool tests.   Cultures (to look for evidence of infection).   X-rays or other imaging studies.  Test results can help your caregiver make decisions about treatment or the need for additional tests. TREATMENT You need to stay well hydrated. Drink frequently but in small amounts.You may wish to drink water, sports drinks, clear broth, or eat frozen ice pops or gelatin dessert to help stay hydrated.When you eat, eating slowly may help prevent nausea.There are also some  antinausea medicines that may help prevent nausea. HOME CARE INSTRUCTIONS   Take all medicine as directed by your caregiver.   If you do not have an appetite, do not force yourself to eat. However, you must continue to drink fluids.   If you have an appetite, eat a normal diet unless your caregiver tells you differently.   Eat a variety of complex carbohydrates (rice, wheat, potatoes, bread), lean meats, yogurt, fruits, and vegetables.   Avoid high-fat foods because they are more difficult to digest.   Drink enough water and fluids to keep your urine clear or pale yellow.   If you are dehydrated, ask your caregiver for specific rehydration instructions. Signs of dehydration may include:   Severe thirst.   Dry lips and mouth.   Dizziness.   Dark urine.   Decreasing urine frequency and  amount.   Confusion.   Rapid breathing or pulse.  SEEK IMMEDIATE MEDICAL CARE IF:   You have blood or brown flecks (like coffee grounds) in your vomit.   You have black or bloody stools.   You have a severe headache or stiff neck.   You are confused.   You have severe abdominal pain.   You have chest pain or trouble breathing.   You do not urinate at least once every 8 hours.   You develop cold or clammy skin.   You continue to vomit for longer than 24 to 48 hours.   You have a fever.  MAKE SURE YOU:   Understand these instructions.   Will watch your condition.   Will get help right away if you are not doing well or get worse.  Document Released: 12/22/2004 Document Revised: 12/11/2010 Document Reviewed: 05/21/2010 [facility] Patient Information 2012 [place], [place].

## 2011-04-24 NOTE — ED Notes (Signed)
Pt has G tube for feeding and the potassium so meq was placed down tube after flushing, checking for placement pouring med and reflushing and clamping off tube.  After 20 min other 20 meq will be given per NP orders.  Throat is still hurting even after the GI cocktail  was given.

## 2011-04-24 NOTE — ED Notes (Signed)
Pt is unable to drink water at this time.

## 2011-04-24 NOTE — Telephone Encounter (Signed)
Call to patient to f/u on ED visit last night. Pt seen in ED for vomiting and hypokalemia. Received IVF with K+. Pt states that he is not vomiting at this time. He reports that he has reflux symptoms and then cause him to vomit. He also has a persistent cough. CXR negative in ED. Has Zofran, Compazine, and Ativan at home. Recommend Omeprazole daily for reflux symptoms. Reminded patient to use salt/baking soda and H2O with H2O2 rinses. Recommend clear liquids for 24 hours then advance as tolerated. Keep appt with [place] on Monday. Pt states understanding.

## 2011-04-24 NOTE — ED Notes (Signed)
Pt unable to urinate at this time. Pt aware of need of urine sample

## 2011-04-27 ENCOUNTER — Ambulatory Visit (HOSPITAL_BASED_OUTPATIENT_CLINIC_OR_DEPARTMENT_OTHER): Payer: BC Managed Care – PPO | Admitting: Oncology

## 2011-04-27 ENCOUNTER — Telehealth: Payer: Self-pay | Admitting: Oncology

## 2011-04-27 ENCOUNTER — Ambulatory Visit
Admission: RE | Admit: 2011-04-27 | Discharge: 2011-04-27 | Disposition: A | Discharge status: Home / Self Care | Payer: BC Managed Care – PPO | Source: Ambulatory Visit | Attending: Radiation Oncology | Admitting: Radiation Oncology

## 2011-04-27 VITALS — BP 110/75 | HR 101 | Temp 97.7°F | Ht 68.0 in | Wt 212.9 lb

## 2011-04-27 DIAGNOSIS — B977 Papillomavirus as the cause of diseases classified elsewhere: Secondary | ICD-10-CM

## 2011-04-27 DIAGNOSIS — K121 Other forms of stomatitis: Secondary | ICD-10-CM

## 2011-04-27 DIAGNOSIS — C099 Malignant neoplasm of tonsil, unspecified: Secondary | ICD-10-CM

## 2011-04-27 DIAGNOSIS — K1230 Oral mucositis (ulcerative), unspecified: Secondary | ICD-10-CM

## 2011-04-27 MED ORDER — FENTANYL 25 MCG/HR TD PT72: 1.0000 | MEDICATED_PATCH | TRANSDERMAL | Status: DC

## 2011-04-27 NOTE — Telephone Encounter (Signed)
Gave pt appt for April and May 2013, lab infusion, ML and MD

## 2011-04-27 NOTE — Progress Notes (Signed)
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   CURRENT THERAPY: Concurrent chemoradiation with q3wk Cisplatin 100mg /m2 and daily radiation. Cisplatin was started on 04/07/11.  INTERVAL HISTORY: Kyle Hendrix 46 y.o. male returns for regular follow up.  He had nausea/vomiting on Thursday after cleaning up his dog.  He went to ED and was found to have hypokalemia.  He received 2 Liter of IVF, antiemetics, and KCl IV.  He has not had vomiting since then.  He has had worsened mucositis.  He puts about 6 cans of Ensure to his PEG in addition to shakes and pureed foods.  He denies PEG tube pain, discharge, erythema.  He uses about 4x/day Lortab elixir which does not last more than 4-6 hours.  He wants long acting pain meds.  He has xerostomia and thick phlegm.  He has been using diluted H2O2 for the thick phlegm.  He has mild fatigue; however, he is independent of all activities of daily living.   Patient denies headache, visual changes, confusion, drenching night sweats, tinnitus, hearing loss, palpable lymph node swelling, mucositis, odynophagia, dysphagia, nausea vomiting, jaundice, chest pain, palpitation, shortness of breath, dyspnea on exertion, productive cough, gum bleeding, epistaxis, hematemesis, hemoptysis, abdominal pain, abdominal swelling, early satiety, melena, hematochezia, hematuria, skin rash, spontaneous bleeding, joint swelling, joint pain, heat or cold intolerance, bowel bladder incontinence, back pain, focal motor weakness, paresthesia, depression, suicidal or homocidal ideation, feeling hopelessness.   Past Medical History  Diagnosis Date  . Nasal polyps   . 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 Depression    . Fatigue   . Degeneration of lumbar intervertebral disc   . Allergy     seasonal  . Asthma     seasonal  . 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 TMJ (dislocation of temporomandibular joint)   . Sleep apnea     Past Surgical History  Procedure Date  . Anterior release vertebral body w/ posterior fusion     lumbar  . Back surgery 09/1999    L4-5 fusion  . Multiple tooth extractions March 2013    6 teeth removed  . Peg placement March 2013    Placed at [facility]  . Panendoscopy 03/18/2011    Procedure: PANENDOSCOPY;  Surgeon: Flo Shanks, MD;  Location: [facility] OR;  Service: ENT;  Laterality: N/A;  PANDENDONOSCOPY  . Tonsillectomy 03/18/2011    Procedure: TONSILLECTOMY;  Surgeon: Flo Shanks, MD;  Location: [facility] OR;  Service: ENT;  Laterality: Right;    Current Outpatient Prescriptions  Medication Sig Dispense Refill  . Diphenhyd-Hydrocort-Nystatin SUSP Use as directed 15 mLs in the mouth or throat 4 (four) times daily as needed (Swish, gargle and spit as needed for mouth sores).  500 mL  3  . HYDROcodone-acetaminophen (LORTAB) 7.5-500 MG/15ML solution Take 15 mLs by mouth every 4 (four) hours as needed for pain.  120 mL  0  . lisdexamfetamine (VYVANSE) 70 MG capsule Take 70 mg by mouth every morning.      . Loperamide HCl (IMODIUM PO) Take by mouth. Take 2 tablets after first loose stool, then 1 tablets after each additional loose stool. 8 tablets daily, maximum.      Marland Kitchen LORazepam (ATIVAN) 0.5 MG tablet Take  1 tablet (0.5 mg total) by mouth every 6 (six) hours as needed (Nausea or vomiting).  30 tablet  0  . meloxicam (MOBIC) 15 MG tablet Take 15 mg by mouth daily as needed.       Marland Kitchen omeprazole (PRILOSEC) 20 MG capsule Take 1 capsule (20 mg total) by mouth daily.  30 capsule  2  . ondansetron (ZOFRAN ODT) 8 MG disintegrating tablet Take 1 tablet (8 mg total) by mouth every 8 (eight) hours as needed for nausea.  20 tablet  0  . ondansetron (ZOFRAN) 8 MG tablet Take 8 mg  by mouth every 12 (twelve) hours as needed.      . prochlorperazine (COMPAZINE) 10 MG tablet Take 10 mg by mouth every 6 (six) hours as needed.      . sertraline (ZOLOFT) 100 MG tablet Take 100 mg by mouth at bedtime.       . fentaNYL (DURAGESIC - DOSED MCG/HR) 25 MCG/HR Place 1 patch (25 mcg total) onto the skin every 3 (three) days.  10 patch  0    ALLERGIES:  is allergic to contrast media; iodine; shellfish allergy; betadine; avelox; morphine and related; prednisone; and ultram.  REVIEW OF SYSTEMS:  The rest of the 14-point review of system was negative.   Filed Vitals:   04/27/11 0847  BP: 110/75  Pulse: 101  Temp: 97.7 F (36.5 C)   Wt Readings from Last 3 Encounters:  04/27/11 212 lb 14.4 oz (96.571 kg)  04/23/11 210 lb 11.2 oz (95.573 kg)  04/20/11 214 lb 1.6 oz (97.115 kg)   ECOG Performance status: 0  PHYSICAL EXAMINATION:   General:  well-nourished in no acute distress.  Eyes:  no scleral icterus.  ENT:  There were no oropharyngeal lesions.  There was radiation-induced changes with erythema and white fibrosis; but no active thrush.  Neck was without thyromegaly.  Skin of the neck was slightly red but no opened wound or active discharge or pain on palpation.  Lymphatics:  Negative cervical, supraclavicular or axillary adenopathy.  Respiratory: lungs were clear bilaterally without wheezing or crackles.  Cardiovascular:  Regular rate and rhythm, S1/S2, without murmur, rub or gallop.  There was no pedal edema.  GI:  abdomen was soft, flat, nontender, nondistended, without organomegaly.  PEG tube in place without erythema, purulent discharge, pain on palpation.  Muscoloskeletal:  no spinal tenderness of palpation of vertebral spine.  Skin exam was without echymosis, petichae.  Neuro exam was nonfocal.  Patient was able to get on and off exam table without assistance.  Gait was normal.  Patient was alerted and oriented.  Attention was good.   Language was appropriate.  Mood was normal  without depression.  Speech was not pressured.  Thought content was not tangential.      LABORATORY/RADIOLOGY DATA:  Lab Results  Component Value Date   WBC 4.1 04/24/2011   HGB 11.7* 04/24/2011   HCT 33.2* 04/24/2011   PLT 252 04/24/2011   GLUCOSE 99 04/24/2011   ALKPHOS 72 04/24/2011   ALT 19 04/24/2011   AST 14 04/24/2011   NA 140 04/24/2011   K 3.4* 04/24/2011   CL 101 04/24/2011   CREATININE 0.83 04/24/2011   BUN 14 04/24/2011   CO2 30 04/24/2011    Dg Chest 2 View  04/24/2011  *RADIOLOGY REPORT*  Clinical Data: Nausea, emesis, and diarrhea  CHEST - 2 VIEW  Comparison: None.  Findings: The heart, mediastinal, and hilar contours are normal. The lungs are well-expanded  and clear. Negative for pleural effusion. The bony thorax is unremarkable.  IMPRESSION: No acute cardiopulmonary disease.  Original Report Authenticated By: Britta Mccreedy, M.D.    ASSESSMENT AND PLAN:   1. 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 On concurrent q 3 week Cisplatin with XRT. S/p 1 cycle of Cisplatin.  He had grade 2 nausea.  He does not have dose limiting toxicity.  I advised him to proceed with the 2nd dose of q3wk Cisplatin tomorrow without dose modification.  I educated patient about the dormancy of HPV of at least 15-20 years from exposure to [facility].  His wife was tested negative for HPV with her [employer].  Most likely patient himself was exposed to HPV prior to meeting his current spouse.   2. Nausea/vomiting: Has Zofran/Ativan/Compazine.  To prevent for presentation to ED for nausea/vomiting, I arranged for 2 days a week of IVF on Thursdays and Fridays over the next 3 weeks.   3. Mucositis/xerostomia: Continue salt/baking soda rinses and alternate with diluted peroxide.  4. Protein calorie malnutrition: His weight is stable.  He is no longer taking foods by mouth due to pain.  I advised him to continue with swallowing  exercise to reduce the risk of esophageal stenosis.   5.  Mucositis pain control:  He takes Lortab 4x/day but still has pain.  I therefore started him on Fentanyl patch 69mcg/hr transdermal; changed every 3 days.  He can still use Lortab elixir for breakthrough pain as needed.   6.  Follow up:  RV with Kristin in one week.      The length of time of the face-to-face encounter was 25 minutes. More than 50% of time was spent counseling and coordination of care.

## 2011-04-28 ENCOUNTER — Ambulatory Visit: Payer: BC Managed Care – PPO | Admitting: Nutrition

## 2011-04-28 ENCOUNTER — Ambulatory Visit
Admission: RE | Admit: 2011-04-28 | Discharge: 2011-04-28 | Disposition: A | Discharge status: Home / Self Care | Payer: BC Managed Care – PPO | Source: Ambulatory Visit | Attending: Radiation Oncology | Admitting: Radiation Oncology

## 2011-04-28 ENCOUNTER — Ambulatory Visit (HOSPITAL_BASED_OUTPATIENT_CLINIC_OR_DEPARTMENT_OTHER): Payer: BC Managed Care – PPO

## 2011-04-28 ENCOUNTER — Encounter: Payer: Self-pay | Admitting: Oncology

## 2011-04-28 VITALS — BP 110/79 | HR 96 | Temp 98.1°F

## 2011-04-28 DIAGNOSIS — Z5111 Encounter for antineoplastic chemotherapy: Secondary | ICD-10-CM

## 2011-04-28 DIAGNOSIS — C099 Malignant neoplasm of tonsil, unspecified: Secondary | ICD-10-CM

## 2011-04-28 DIAGNOSIS — C801 Malignant (primary) neoplasm, unspecified: Secondary | ICD-10-CM

## 2011-04-28 MED ORDER — PALONOSETRON HCL INJECTION 0.25 MG/5ML
0.2500 mg | Freq: Once | INTRAVENOUS | Status: AC
  Administered 2011-04-28: 0.25 mg via INTRAVENOUS

## 2011-04-28 MED ORDER — SODIUM CHLORIDE 0.9 % IV SOLN
100.0000 mg/m2 | Freq: Once | INTRAVENOUS | Status: AC
  Administered 2011-04-28: 224 mg via INTRAVENOUS
  Filled 2011-04-28: qty 224

## 2011-04-28 MED ORDER — POTASSIUM CHLORIDE 2 MEQ/ML IV SOLN
Freq: Once | INTRAVENOUS | Status: AC
  Administered 2011-04-28: 09:00:00 via INTRAVENOUS
  Filled 2011-04-28: qty 10

## 2011-04-28 MED ORDER — FOSAPREPITANT DIMEGLUMINE INJECTION 150 MG
150.0000 mg | Freq: Once | INTRAVENOUS | Status: AC
  Administered 2011-04-28: 150 mg via INTRAVENOUS
  Filled 2011-04-28: qty 5

## 2011-04-28 MED ORDER — DEXAMETHASONE SODIUM PHOSPHATE 4 MG/ML IJ SOLN
12.0000 mg | Freq: Once | INTRAMUSCULAR | Status: AC
  Administered 2011-04-28: 12 mg via INTRAVENOUS

## 2011-04-28 NOTE — Progress Notes (Signed)
I spoke to [name] today.  His weight was documented as 212.9 pounds yesterday morning which is an increase from 210.7 pounds on the 18th.  His weight has been relatively stable at 212-214 pound range for the last 2 weeks.  The patient reports he is tolerating 6 cans of Ensure Plus daily with 60 cc of water before the bolus feeding and 180 cc of water after the bolus feedings.  He prefers to do 1 can of Ensure Plus every 2 hours throughout the day.  He has had some issues with nausea and vomiting; however, he reports that that has now resolved.  Six cans of Ensure Plus plus the free water is providing 2100 calories, 78 cans of protein and 2520 mL of free water daily.    In addition to tube feeding, the patient reports that he has been pureeing meat, vegetables and fruits in his Vitamix and pouring it through the PEG tube.  He reports that he gets it quite thin so that it is very liquid and he is flushing with plenty of water.  He attributes his 2 pound weight gain since the 18th to adding these pureed foods.  He states it just makes him feel better.  He feels like he is absorbing more nutrients from food than he is from Ensure Plus.  NUTRITION DIAGNOSIS:  Inadequate oral intake continues.  INTERVENTION:  I have educated the patient on the importance of continuing 6 cans of Ensure Plus with free water as described to provide minimal calorie needs.  I have cautioned him that pureeing foods and putting them through the tube is not recommended, it can cause clogging of the feeding tube as well as breakdown; however, since the patient is adamant about continuing to do this, I have given him instructions on being sure to flush his tube with plenty of water and he does understand the risks involved in using food through his feeding tube.  I have advised him on adding protein to his regimen.  Originally I had recommended that he use a whey protein powder mixed with water to put through his feeding tube.  I am  not sure at this point if he is going to use the whey protein as I have recommended or if he is going to try to consume pureed meats, but he does understand that he needs about 40 g of additional protein daily.  I have encouraged him to purchase a whey protein powder, such as Unjury which he has received samples of and tolerates well and to use 2 packets of Unjury daily.  The patient verbalizes understanding.  MONITORING/EVALUATION/GOALS:  The patient has tolerated tube feedings to provide weight stability.  He will work to continue to increase whey protein powder to meet minimum protein needs.  NEXT VISIT:  Wednesday, May 1st, during chemotherapy.    ______________________________ Kyle Hendrix, RD, LDN Clinical Nutrition Specialist BN/MEDQ  D:  04/28/2011  T:  04/28/2011  Job:  [id number]

## 2011-04-28 NOTE — Progress Notes (Signed)
Patient received three prescriptions from [facility] on 04/24/11 $26.85 ,his remaninig balance [facility] $356.37.

## 2011-04-29 ENCOUNTER — Ambulatory Visit
Admission: RE | Admit: 2011-04-29 | Discharge: 2011-04-29 | Disposition: A | Discharge status: Home / Self Care | Payer: BC Managed Care – PPO | Source: Ambulatory Visit | Attending: Radiation Oncology | Admitting: Radiation Oncology

## 2011-04-29 ENCOUNTER — Ambulatory Visit (HOSPITAL_COMMUNITY): Payer: Self-pay | Admitting: Dentistry

## 2011-04-29 VITALS — BP 115/76 | HR 86 | Temp 97.2°F

## 2011-04-29 DIAGNOSIS — K117 Disturbances of salivary secretion: Secondary | ICD-10-CM

## 2011-04-29 DIAGNOSIS — R634 Abnormal weight loss: Secondary | ICD-10-CM

## 2011-04-29 DIAGNOSIS — K08409 Partial loss of teeth, unspecified cause, unspecified class: Secondary | ICD-10-CM

## 2011-04-29 DIAGNOSIS — R1310 Dysphagia, unspecified: Secondary | ICD-10-CM

## 2011-04-29 DIAGNOSIS — R439 Unspecified disturbances of smell and taste: Secondary | ICD-10-CM

## 2011-04-29 DIAGNOSIS — K1233 Oral mucositis (ulcerative) due to radiation: Secondary | ICD-10-CM

## 2011-04-29 DIAGNOSIS — R432 Parageusia: Secondary | ICD-10-CM

## 2011-04-29 NOTE — Progress Notes (Signed)
Wednesday, April 29, 2011   BP:  115/76                  P:86              T:  97.2           Wgt: 212 lbs now. Patient was 236 lbs before treatment.  Kyle Hendrix is a 84 yer old male recently diagnsoed with SCCa of the Right Tonsil. Patient is currently undergoing chemoradiation therapy with dose of chemotherapy given yesterday. Patient now presents for a periodic oral examination during radiation therapy.   Patient has completed 16 of 33 radiation treatments.  REVIEW OF CHIEF COMPLAINTS: DRY MOUTH: Yes, but not too bad. HARD TO SWALLOW: Yes HURT TO SWALLOW: Yes, but not too bad with medication. Basically using his feeding tube. TASTE CHANGES: No taste SORES IN MOUTH: Yes TRISMUS SYMPTOMS: None.  SYMPTOM RELIEF:  Using baking soda and salt water, Biotene Rinses, and magic mouthwash  HOME ORAL HYGIENE REGIMEN:  BRUSHING: 2X a day FLOSSING: None RINSING: see above FLUORIDE: Doing fluoride at night having no problems. TRISMUS EXERCISES: Maximim interincisal opening: 40 mm.   DENTAL EXAM: ORAL HYGIENE(PLAQUE): Good oral hygiene. LOCATION OF MUCOSITIS: Lateral tongue and floor of mouth. Back of throat. DESCRIPTION OF SALIVA: Decreased. Mild xerostomia. Thicker saliva noted. ANY EXPOSED BONE: None noted.  OTHER WATCHED AREAS: Previous extraction sites. DIAGNOSES: 1.  Xerostomia  2.  Dysgeusia 3.  Dysphagia 4.  Odynophagia 5.  Mucositis 6.  Weight Loss  RECOMMENDATIONS: 1. Brush after meals and at bedtime. Use fluoride at bedtime. 2. Use trismus exercises as directed. 3. Use Biotene Rinse or salt water/baking soda rinses. 4. Multiple sips of water as needed. 5. RTC in two months for periodic oral examination status post radiation therapy . Call if problems before then.  Dr. Cindra Eves

## 2011-04-30 ENCOUNTER — Encounter: Payer: Self-pay | Admitting: Oncology

## 2011-04-30 ENCOUNTER — Ambulatory Visit
Admission: RE | Admit: 2011-04-30 | Discharge: 2011-04-30 | Disposition: A | Discharge status: Home / Self Care | Payer: BC Managed Care – PPO | Source: Ambulatory Visit | Attending: Radiation Oncology | Admitting: Radiation Oncology

## 2011-04-30 ENCOUNTER — Encounter: Payer: Self-pay | Admitting: Radiation Oncology

## 2011-04-30 VITALS — BP 113/77 | HR 83 | Temp 98.6°F | Resp 20 | Wt 214.0 lb

## 2011-04-30 DIAGNOSIS — C099 Malignant neoplasm of tonsil, unspecified: Secondary | ICD-10-CM

## 2011-04-30 NOTE — Progress Notes (Signed)
  Radiation Oncology         [phone] ________________________________  Name: Kyle Hendrix MRN: [id number]  Date: 03/27/2011  DOB: 18-Mar-1965  SIMULATION AND TREATMENT PLANNING NOTE  DIAGNOSIS:  Tonsillar cancer.  NARRATIVE:  The patient was brought to the CT Simulation planning suite.  Identity was confirmed.  All relevant records and images related to the planned course of therapy were reviewed.  The patient freely provided informed written consent to proceed with treatment after reviewing the details related to the planned course of therapy. The consent form was witnessed and verified by the simulation staff.  Then, the patient was set-up in a stable reproducible  supine position for radiation therapy.  A customized thermoplastic head cast was constructed to aid in patient immobilization during his treatment. CT images were obtained.  Surface markings were placed.  The CT images were loaded into the planning software.  Then the target and avoidance structures were contoured.  Treatment planning then occurred.  The radiation prescription was entered and confirmed.    The patient will receive a course of IMRT on her chemotherapy unit. He will receive a course of simultaneous integrated boost therapy. 3 dose levels have been identified with their corresponding PTVs including 6996, 6270, and 5610. Each of these dose targets will be treated in 33 fractions. The dose volume histograms of each of these target structures in addition to additional critical normal structures such as the spinal cord and brainstem mandible and parotid glands will also be reviewed. And I am RT technique is necessary given the complexity of the patient's target structures and the close proximity of these structures to additional normal structures which otherwise could not be spared adequately. This includes both the spinal cord brainstem and structures such as the mandible in addition to the parotid glands which would  negatively and possibly permanently impact the patient's quality of life.  The patient will receive concurrent chemotherapy during this treatment. He may experience increased toxicity and he therefore will be monitored closely for such problems. This may require extra lab work as necessary. This therefore constitutes a special treatment procedure.  PLAN:  The patient will receive 69.96 Gy in 33 fraction.  ________________________________  Radene Gunning, M.D., Ph.D.

## 2011-04-30 NOTE — ED Provider Notes (Signed)
Medical screening examination/treatment/procedure(s) were performed by non-physician practitioner and as supervising physician I was immediately available for consultation/collaboration.   Nat Christen, MD 04/30/11 [phone]

## 2011-04-30 NOTE — Progress Notes (Signed)
To Whom It May Concern,  I am writing a letter to document that the patient Kyle Hendrix is a patient of mine who is currently undergoing radiation treatment for his head and neck cancer. He is to receive 6-1/2 weeks of treatment and he will also receive chemotherapy during this time period. The patient required dental work prior to beginning his radiation treatment. This was necessary as part of his overall treatment plan. Substantial increased risk is present if these procedures are not carried out when the patient will receive a treatment such as that which we are currently giving to this patient. The patient has tonsillar cancer and the oral cavity is to receive a significant amount of radiation, as is typically is the case for such a tumor location.  It is therefore routine practice and standard of care for such patients to undergo dental work as part of their overall treatment plan prior to radiation. If there are any questions regarding his overall treatment, please do not hesitate to contact our office.  I have also attached below the patient's initial dental evaluation.  Sincerely,  Radene Gunning, M.D., Ph.D.   DENTAL CONSULTATION   Date of Consultation:              03/05/2011 Patient Name:                         Kyle Hendrix Date of Birth:                           12/06/1965 Medical Record Number:        [id number]   VITALS: BP 128/74  Pulse 103  Temp(Src) 98.1 F (36.7 C) (Oral)     REASON FOR CONSULTATION: Patient needs a pre-chemoradiation therapy dental evaluation.   HPI: Kyle Hendrix is a 46 year old male with squamous cell carcinoma of the right tonsil. Patient with anticipated chemoradiation therapy. Patient is now be seen as part of a pre-chemoradiation therapy dental protocol evaluation.   Patient currently denies having any acute toothache, swellings, or abscesses. Patient was last seen in September or October 2012 for an exam and cleaning.  Patient denies having any unmet dental needs. Patient indicates that he sees Dr. Darnelle Bos for his regular dental care has been seeing him over the past 8-10 years. Patient is a history of having orthodontic therapy from the age of 8 through 13 with Dr. Vanice Sarah. Patient also has a history of having previous temporomandibular joint dysfunction and previous maxillary splint therapy. Patient hasn't not worn the splint for the past 2-3 years and denies having any active acute TMJ symptoms.     PMH: Past Medical History   Diagnosis  Date   .  Nasal polyps     .  Sleep apnea     .  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  Depression     .  Fatigue     .  Degeneration of lumbar intervertebral disc     .  Allergy         seasonal   .  Asthma         seasonal   .  Right tonsillar squamous cell carcinoma  02/05/11   .  TMJ (dislocation of temporomandibular joint)          PSH: Past Surgical History   Procedure  Date   .  Anterior release vertebral body w/ posterior fusion         lumbar   .  Back surgery  09/1999       L4-5 fusion        ALLERGIES: Allergies   Allergen  Reactions   .  Iodine  Anaphylaxis   .  Shellfish Allergy  Anaphylaxis   .  Avelox (Moxifloxacin Hcl In Nacl)  Hives   .  Morphine And Related  Hives   .  Prednisone  Other (See Comments)       Altered mental status   .  Ultram (Tramadol Hcl)  Hives        MEDICATIONS: Current Outpatient Prescriptions   Medication  Sig  Dispense  Refill   .  ARIPiprazole (ABILIFY) 10 MG tablet  Take 10 mg by mouth daily.         Marland Kitchen  lisdexamfetamine (VYVANSE) 70 MG capsule  Take 70 mg by mouth every morning.         Marland Kitchen  LORazepam (ATIVAN) 0.5 MG tablet  Take 1 tablet (0.5 mg total) by mouth every 6 (six) hours as needed (Nausea or vomiting).   30 tablet   0   .  ondansetron (ZOFRAN) 8 MG tablet  Take 1 tablet two times a day as needed for nausea or vomiting starting on the third day after  chemotherapy.   30 tablet   1   .  prochlorperazine (COMPAZINE) 10 MG tablet  Take 1 tablet (10 mg total) by mouth every 6 (six) hours as needed (Nausea or vomiting).   30 tablet   1   .  sertraline (ZOLOFT) 100 MG tablet  Take 100 mg by mouth daily.              LABS: Lab Results   Component  Value  Date     WBC  8.4  03/04/2011     HGB  15.3  03/04/2011     HCT  44.7  03/04/2011     MCV  87.5  03/04/2011     PLT  265  03/04/2011           Component  Value  Date/Time     NA  141  03/04/2011 1131     K  4.2  03/04/2011 1131     CL  103  03/04/2011 1131     CO2  25  03/04/2011 1131     GLUCOSE  90  03/04/2011 1131     BUN  12  03/04/2011 1131     CREATININE  0.92  03/04/2011 1131     CALCIUM  9.7  03/04/2011 1131     GFRNONAA  >60  01/10/2009 2234     GFRAA   Value: >60        The eGFR has been calculated using the MDRD equation. This calculation has not been validated in all clinical situations. eGFR's persistently <60 mL/min signify possible Chronic Kidney Disease.  01/10/2009 2234       No results found for this basename: INR, PROTIME       No results found for this basename: PTT        SOCIAL HISTORY: History       Social History   .  Marital Status:  Single       Spouse Name:  N/A       Number of Children:  2   .  Years of Education:  N/A  Occupational History   .    [employer]       now MRI tech at [employer]        Social History Main Topics   .  Smoking status:  Never Smoker    .  Smokeless tobacco:  Former Neurosurgeon       Types:  Chew       Quit date:  03/04/1999   .  Alcohol Use:  Yes         socially   .  Drug Use:  Yes         high school , college   .  Sexually Active:         Other Topics  Concern   .  Not on file       Social History Narrative   .  No narrative on file        FAMILY HISTORY: Family History   Problem  Relation  Age of Onset   .  Diabetes  Mother     .  Hypertension  Mother     .  Stroke  Maternal Grandfather             REVIEW OF SYSTEMS: Reviewed from chart for this admission.   DENTAL HISTORY: Chief Complaint: Patient needs a pre-chemoradiation therapy dental evaluation.   HPI: Kyle Hendrix is a 46 year old male with squamous cell carcinoma of the right tonsil. Patient with anticipated chemoradiation therapy. Patient is now be seen as part of a pre-chemoradiation therapy dental protocol evaluation.   Patient currently denies having any acute toothache, swellings, or abscesses. Patient was last seen in September or October 2012 for an exam and cleaning. Patient denies having any unmet dental needs. Patient indicates that he sees Dr. Darnelle Bos for his regular dental care has been seeing him over the past 8-10 years. Patient is a history of having orthodontic therapy from the age of 23 through 59 with Dr. Vanice Sarah. Patient also has a history of having previous temporomandibular joint dysfunction and previous maxillary splint therapy. Patient hasn't not worn the splint for the past 2-3 years and denies having any active acute TMJ symptoms.   DENTAL EXAMINATION:   GENERAL: Patient is a well-developed, well-nourished male in no acute distress. HEAD AND NECK: There is right neck lymphadenopathy. I am unable to palpate any left neck lymphadenopathy. Patient denies acute TMJ symptoms. INTRAORAL EXAM: Patient has normal saliva. I do not see any evidence of abscess formation. DENTITION: Patient is missing tooth numbers 1, 5, 12, 16, 17, 21, 28, and 32. Most spaces are closed with orthodontic therapy. PERIODONTAL: Patient with chronic periodontitis with plaque and calculus accumulations, selective areas of gingival recession and no significant tooth mobility. DENTAL CARIES/SUBOPTIMAL RESTORATIONS: Tooth #20 hasn't distal caries. The crown margins on tooth #30 are less than ideal. ENDODONTIC: Patient has root canal therapies associated with tooth numbers 18 and 19. There may be some persistent  periapical pathology associated with the distal root of tooth #18. CROWN AND BRIDGE: Patient has multiple crown restorations. The crown margins on tooth numbers 19 and 30 appear to be less than ideal. PROSTHODONTIC: There is no history of partial dentures. OCCLUSION: The occlusion is stable at this time.   RADIOGRAPHIC INTERPRETATION: An orthopantogram was obtained and supplemented with a full series of dental radiographs. There are multiple missing teeth. Most spaces have enclosed secondary to orthodontic therapy. Multiple dental restorations noted. Dental caries are noted. Suboptimal margins of the  crowns are noted. Patient has previous root canal therapies associated with tooth numbers 18 and 19. There may be some persistent periapical pathology associated with the distal root of tooth #18.   ASSESSMENTS: 1. Chronic periodontitis with bone loss 2. Plaque and calculus accumulations 3. Dental caries 4. Suboptimal dental restorations 5. Possible persistent periapical pathology associated with the distal root of tooth #18. 6. Multiple missing teeth. 7. History of orthodontic therapy with the closure of multiple spaces. 8. Multiple diastemas 9. Decalcifications on the facial aspect of multiple teeth.     PLAN/RECOMMENDATIONS: 1. I discussed the risks, benefits, and complications of various treatment options with the patient in relationship to the medical and dental conditions. We discussed various treatment options to include no treatment, multiple extractions of the teeth in the primary field of radiation therapy, alveoloplasty, pre-prosthetic surgery as indicated, periodontal therapy, dental restorations, root canal therapy, crown and bridge therapy, implant therapy, and replacement of missing teeth as indicated. The patient currently wishes to proceed with referral to an oral surgeon for second opinion on the extraction of tooth numbers 2, 3, 15,18,30,31 with alveoloplasty to achieve  primary closure. The teeth are present the teeth in the primary field radiation therapy. An appointment with Dr. Retta Mac has been arranged for Wednesday, March 6 at 1:45 PM. Patient did agree to proceed with impressions for the fabrication of fluoride trays and scatter protection devices as indicated at this time. Patient is to return to dental medicine for the insertion of these devices prior to the simulation appointment with Dr. Mitzi Hansen.   2. Discussion of findings with medical team and dental teams and coordination of future medical and dental care as indicated.     Charlynne Pander, DDS

## 2011-04-30 NOTE — Progress Notes (Signed)
Patient received three prescriptions from [facility] on 04/28/11 $75.16,his remaninig balance [facility] $203.14.

## 2011-04-30 NOTE — Progress Notes (Signed)
Encounter addended by: Jonna Coup, MD on: 04/30/2011  9:48 AM<BR>     Documentation filed: Notes Section

## 2011-04-30 NOTE — Progress Notes (Signed)
Pt had chemo 2 days ago, very fatigued today. States Duragesic patch and Lortab elixir on regular basis are controlling his throat, swallowing pain well. Drinking little sips. Peg tube feedings of 6 cans/daily, ttol well.  States he woke w/pain in right cheek/sinus area, had nosebleed. Had another nosebleed when he woke at 6:30 am.   Taking antiemetic meds regularly, has had 2 episodes of vomiting this morning w/small amts thick mucus and Gatoraid.

## 2011-04-30 NOTE — Progress Notes (Signed)
Department of Radiation Oncology  Phone:  [phone] Fax:        [phone]  Weekly Treatment Note    Name: Kyle Hendrix Date: 04/30/2011 MRN: [id number] DOB: 06-15-1965   Current dose: 38.2 gray  Current fraction: 18   MEDICATIONS: Current Outpatient Prescriptions  Medication Sig Dispense Refill  . Diphenhyd-Hydrocort-Nystatin SUSP Use as directed 15 mLs in the mouth or throat 4 (four) times daily as needed (Swish, gargle and spit as needed for mouth sores).  500 mL  3  . fentaNYL (DURAGESIC - DOSED MCG/HR) 25 MCG/HR Place 1 patch (25 mcg total) onto the skin every 3 (three) days.  10 patch  0  . HYDROcodone-acetaminophen (LORTAB) 7.5-500 MG/15ML solution Take 15 mLs by mouth every 4 (four) hours as needed for pain.  120 mL  0  . lisdexamfetamine (VYVANSE) 70 MG capsule Take 70 mg by mouth every morning.      . Loperamide HCl (IMODIUM PO) Take by mouth. Take 2 tablets after first loose stool, then 1 tablets after each additional loose stool. 8 tablets daily, maximum.      Marland Kitchen LORazepam (ATIVAN) 0.5 MG tablet Take 1 tablet (0.5 mg total) by mouth every 6 (six) hours as needed (Nausea or vomiting).  30 tablet  0  . meloxicam (MOBIC) 15 MG tablet Take 15 mg by mouth daily as needed.       . nystatin (MYCOSTATIN) 100000 UNIT/ML suspension       . omeprazole (PRILOSEC) 20 MG capsule Take 1 capsule (20 mg total) by mouth daily.  30 capsule  2  . ondansetron (ZOFRAN ODT) 8 MG disintegrating tablet Take 1 tablet (8 mg total) by mouth every 8 (eight) hours as needed for nausea.  20 tablet  0  . ondansetron (ZOFRAN) 8 MG tablet Take 8 mg by mouth every 12 (twelve) hours as needed.      . prochlorperazine (COMPAZINE) 10 MG tablet Take 10 mg by mouth every 6 (six) hours as needed.      . sertraline (ZOLOFT) 100 MG tablet Take 100 mg by mouth at bedtime.          ALLERGIES: Contrast media; Iodine; Shellfish allergy; Betadine; Avelox; Morphine and related; Prednisone; and  Ultram   LABORATORY DATA:  Lab Results  Component Value Date   WBC 4.1 04/24/2011   HGB 11.7* 04/24/2011   HCT 33.2* 04/24/2011   MCV 86.8 04/24/2011   PLT 252 04/24/2011   Lab Results  Component Value Date   NA 140 04/24/2011   K 3.4* 04/24/2011   CL 101 04/24/2011   CO2 30 04/24/2011   Lab Results  Component Value Date   ALT 19 04/24/2011   AST 14 04/24/2011   ALKPHOS 72 04/24/2011   BILITOT 0.4 04/24/2011     NARRATIVE: Kyle Hendrix was seen today for weekly treatment management. The chart was checked and the patient's films were reviewed. The patient is doing satisfactorily today. He did have some nausea and he takes some anti-emetics for this which have been working relatively well. The patient is fatigue. His weight is up 4 pounds which am pleased with. The patient's labs did show some low platelets and he has had a couple of nosebleeds and I talked to him about that.  PHYSICAL EXAMINATION: weight is 214 lb (97.07 kg). His oral temperature is 98.6 F (37 C). His blood pressure is 113/77 and his pulse is 83. His respiration is 20.  the skin looks quite good at this point with really minimal skin changes currently, especially for where he is in his treatment.  ASSESSMENT: The patient is doing satisfactorily with treatment.  PLAN: We will continue with the patient's radiation treatment as planned. The patient has several issues today but each of these have been adequately addressed at this time. He is on Duragesic with Lortab elixir for breakthrough pain and this is working well for him.

## 2011-05-01 ENCOUNTER — Inpatient Hospital Stay (HOSPITAL_COMMUNITY)
Admission: EM | Admit: 2011-05-01 | Discharge: 2011-05-07 | DRG: 814 | Disposition: A | Discharge status: Home / Self Care | Payer: BC Managed Care – PPO | Attending: Internal Medicine | Admitting: Internal Medicine

## 2011-05-01 ENCOUNTER — Ambulatory Visit
Admission: RE | Admit: 2011-05-01 | Discharge: 2011-05-01 | Disposition: A | Discharge status: Home / Self Care | Payer: BC Managed Care – PPO | Source: Ambulatory Visit | Attending: Radiation Oncology | Admitting: Radiation Oncology

## 2011-05-01 ENCOUNTER — Ambulatory Visit (HOSPITAL_BASED_OUTPATIENT_CLINIC_OR_DEPARTMENT_OTHER): Payer: BC Managed Care – PPO

## 2011-05-01 ENCOUNTER — Other Ambulatory Visit: Payer: Self-pay | Admitting: Oncology

## 2011-05-01 ENCOUNTER — Encounter (HOSPITAL_COMMUNITY): Payer: Self-pay | Admitting: General Practice

## 2011-05-01 ENCOUNTER — Other Ambulatory Visit (HOSPITAL_BASED_OUTPATIENT_CLINIC_OR_DEPARTMENT_OTHER): Payer: BC Managed Care – PPO

## 2011-05-01 ENCOUNTER — Telehealth: Payer: Self-pay | Admitting: Oncology

## 2011-05-01 VITALS — BP 124/78 | HR 102 | Temp 97.8°F

## 2011-05-01 DIAGNOSIS — I10 Essential (primary) hypertension: Secondary | ICD-10-CM | POA: Diagnosis present

## 2011-05-01 DIAGNOSIS — R1310 Dysphagia, unspecified: Secondary | ICD-10-CM | POA: Diagnosis present

## 2011-05-01 DIAGNOSIS — R112 Nausea with vomiting, unspecified: Principal | ICD-10-CM | POA: Diagnosis present

## 2011-05-01 DIAGNOSIS — T451X5A Adverse effect of antineoplastic and immunosuppressive drugs, initial encounter: Secondary | ICD-10-CM

## 2011-05-01 DIAGNOSIS — J45909 Unspecified asthma, uncomplicated: Secondary | ICD-10-CM | POA: Diagnosis present

## 2011-05-01 DIAGNOSIS — C099 Malignant neoplasm of tonsil, unspecified: Secondary | ICD-10-CM

## 2011-05-01 DIAGNOSIS — K121 Other forms of stomatitis: Secondary | ICD-10-CM | POA: Diagnosis present

## 2011-05-01 DIAGNOSIS — T7840XA Allergy, unspecified, initial encounter: Secondary | ICD-10-CM | POA: Diagnosis present

## 2011-05-01 DIAGNOSIS — Z9221 Personal history of antineoplastic chemotherapy: Secondary | ICD-10-CM

## 2011-05-01 DIAGNOSIS — K1230 Oral mucositis (ulcerative), unspecified: Secondary | ICD-10-CM

## 2011-05-01 DIAGNOSIS — F988 Other specified behavioral and emotional disorders with onset usually occurring in childhood and adolescence: Secondary | ICD-10-CM | POA: Diagnosis present

## 2011-05-01 DIAGNOSIS — K5900 Constipation, unspecified: Secondary | ICD-10-CM | POA: Diagnosis present

## 2011-05-01 DIAGNOSIS — Z931 Gastrostomy status: Secondary | ICD-10-CM

## 2011-05-01 DIAGNOSIS — G4730 Sleep apnea, unspecified: Secondary | ICD-10-CM | POA: Diagnosis present

## 2011-05-01 LAB — BASIC METABOLIC PANEL
BUN: 17 mg/dL (ref 6–23)
CO2: 28 mEq/L (ref 19–32)
Chloride: 95 mEq/L — ABNORMAL LOW (ref 96–112)
Creatinine, Ser: 1.01 mg/dL (ref 0.50–1.35)
Potassium: 3.8 mEq/L (ref 3.5–5.3)

## 2011-05-01 LAB — CBC WITH DIFFERENTIAL/PLATELET
Basophils Absolute: 0 10*3/uL (ref 0.0–0.1)
Eosinophils Absolute: 0.3 10*3/uL (ref 0.0–0.5)
HCT: 37 % — ABNORMAL LOW (ref 38.4–49.9)
HGB: 12.9 g/dL — ABNORMAL LOW (ref 13.0–17.1)
LYMPH%: 10.1 % — ABNORMAL LOW (ref 14.0–49.0)
MCHC: 34.9 g/dL (ref 32.0–36.0)
MONO#: 0.4 10*3/uL (ref 0.1–0.9)
NEUT#: 3.5 10*3/uL (ref 1.5–6.5)
NEUT%: 75.3 % — ABNORMAL HIGH (ref 39.0–75.0)
Platelets: 249 10*3/uL (ref 140–400)
WBC: 4.7 10*3/uL (ref 4.0–10.3)
lymph#: 0.5 10*3/uL — ABNORMAL LOW (ref 0.9–3.3)

## 2011-05-01 LAB — URINALYSIS, ROUTINE W REFLEX MICROSCOPIC
Ketones, ur: 40 mg/dL — AB
Leukocytes, UA: NEGATIVE
Nitrite: NEGATIVE
Protein, ur: NEGATIVE mg/dL
pH: 7 (ref 5.0–8.0)

## 2011-05-01 LAB — CBC
HCT: 36.2 % — ABNORMAL LOW (ref 39.0–52.0)
Hemoglobin: 12.6 g/dL — ABNORMAL LOW (ref 13.0–17.0)
MCH: 30.6 pg (ref 26.0–34.0)
MCV: 87.9 fL (ref 78.0–100.0)
RBC: 4.12 MIL/uL — ABNORMAL LOW (ref 4.22–5.81)

## 2011-05-01 LAB — DIFFERENTIAL
Eosinophils Absolute: 0.2 10*3/uL (ref 0.0–0.7)
Lymphs Abs: 0.6 10*3/uL — ABNORMAL LOW (ref 0.7–4.0)
Monocytes Absolute: 0.7 10*3/uL (ref 0.1–1.0)
Monocytes Relative: 14 % — ABNORMAL HIGH (ref 3–12)
Neutrophils Relative %: 69 % (ref 43–77)

## 2011-05-01 LAB — COMPREHENSIVE METABOLIC PANEL
Alkaline Phosphatase: 67 U/L (ref 39–117)
BUN: 17 mg/dL (ref 6–23)
GFR calc Af Amer: >90 mL/min (ref >90)
Glucose, Bld: 102 mg/dL — ABNORMAL HIGH (ref 70–99)
Potassium: 3.6 mEq/L (ref 3.5–5.1)
Total Bilirubin: 0.6 mg/dL (ref 0.3–1.2)
Total Protein: 7.9 g/dL (ref 6.0–8.3)

## 2011-05-01 MED ORDER — ONDANSETRON 8 MG/50ML IVPB (CHCC)
8.0000 mg | Freq: Once | INTRAVENOUS | Status: AC
  Administered 2011-05-01: 8 mg via INTRAVENOUS

## 2011-05-01 MED ORDER — ALUM & MAG HYDROXIDE-SIMETH 200-200-20 MG/5ML PO SUSP: 30.0000 mL | Freq: Four times a day (QID) | ORAL | Status: DC | PRN

## 2011-05-01 MED ORDER — DEXAMETHASONE 4 MG PO TABS: 4.0000 mg | ORAL_TABLET | Freq: Two times a day (BID) | ORAL | Status: AC

## 2011-05-01 MED ORDER — OXYCODONE HCL 5 MG PO TABS: 5.0000 mg | ORAL_TABLET | ORAL | Status: DC | PRN

## 2011-05-01 MED ORDER — ACETAMINOPHEN 325 MG PO TABS: 650.0000 mg | ORAL_TABLET | Freq: Four times a day (QID) | ORAL | Status: DC | PRN

## 2011-05-01 MED ORDER — SODIUM CHLORIDE 0.9 % IV SOLN
1000.0000 mL | Freq: Once | INTRAVENOUS | Status: DC
  Administered 2011-05-01: 1000 mL via INTRAVENOUS

## 2011-05-01 MED ORDER — PROMETHAZINE HCL 25 MG/ML IJ SOLN
12.5000 mg | Freq: Once | INTRAMUSCULAR | Status: AC
  Administered 2011-05-01: 12.5 mg via INTRAVENOUS
  Filled 2011-05-01: qty 1

## 2011-05-01 MED ORDER — ONDANSETRON HCL 4 MG/2ML IJ SOLN
4.0000 mg | Freq: Once | INTRAMUSCULAR | Status: AC
  Administered 2011-05-01: 4 mg via INTRAVENOUS
  Filled 2011-05-01: qty 2

## 2011-05-01 MED ORDER — ONDANSETRON HCL 4 MG PO TABS: 4.0000 mg | ORAL_TABLET | Freq: Four times a day (QID) | ORAL | Status: DC | PRN

## 2011-05-01 MED ORDER — DIAZEPAM 5 MG/ML IJ SOLN
5.0000 mg | Freq: Once | INTRAMUSCULAR | Status: AC
  Administered 2011-05-01: 5 mg via INTRAVENOUS
  Filled 2011-05-01: qty 2

## 2011-05-01 MED ORDER — ZOLPIDEM TARTRATE 5 MG PO TABS: 5.0000 mg | ORAL_TABLET | Freq: Every evening | ORAL | Status: DC | PRN

## 2011-05-01 MED ORDER — SODIUM CHLORIDE 0.9 % IV SOLN
1000.0000 mL | INTRAVENOUS | Status: DC
  Administered 2011-05-01 – 2011-05-04 (×4): 1000 mL via INTRAVENOUS

## 2011-05-01 MED ORDER — ONDANSETRON HCL 4 MG/2ML IJ SOLN: 4.0000 mg | Freq: Three times a day (TID) | INTRAMUSCULAR | Status: DC | PRN

## 2011-05-01 MED ORDER — SODIUM CHLORIDE 0.9 % IV SOLN
INTRAVENOUS | Status: DC
  Administered 2011-05-02: via INTRAVENOUS

## 2011-05-01 MED ORDER — PROMETHAZINE HCL 25 MG RE SUPP: 25.0000 mg | Freq: Two times a day (BID) | RECTAL | Status: DC | PRN

## 2011-05-01 MED ORDER — ACETAMINOPHEN 650 MG RE SUPP: 650.0000 mg | Freq: Four times a day (QID) | RECTAL | Status: DC | PRN

## 2011-05-01 MED ORDER — LORAZEPAM 2 MG/ML IJ SOLN
1.0000 mg | Freq: Once | INTRAMUSCULAR | Status: AC
  Administered 2011-05-01: 1 mg via INTRAVENOUS

## 2011-05-01 MED ORDER — ONDANSETRON HCL 4 MG/2ML IJ SOLN
4.0000 mg | Freq: Four times a day (QID) | INTRAMUSCULAR | Status: DC | PRN
  Administered 2011-05-02 – 2011-05-06 (×7): 4 mg via INTRAVENOUS
  Filled 2011-05-01 (×7): qty 2

## 2011-05-01 MED ORDER — DEXTROSE-NACL 5-0.45 % IV SOLN: INTRAVENOUS | Status: DC

## 2011-05-01 MED ORDER — SODIUM CHLORIDE 0.9 % IV SOLN
INTRAVENOUS | Status: DC
  Administered 2011-05-01: 08:00:00 via INTRAVENOUS

## 2011-05-01 NOTE — Progress Notes (Signed)
Pt was seen in infusion today.  He had the 2nd dose of q3wk cisplatin on Tuesday, 04/28/2011. Since then he has been having intractable nausea vomiting. He says that nothing stays down. He has tried Ativan, Compazine, Zofran without relief of his nausea vomiting.    He is receiving 1 L IV fluid today along with Zofran 8 mg IV time 1. If he still has nausea vomiting he will be given Ativan 1 g IV time 1. He has component of delayed nausea vomiting from cisplatin. Therefore I gave him prescription for dexamethasone 4 mg by mouth twice a day for 5 days along with Phenergan 25 mg per suppository every 12 hours when necessary. I advised him to keep his appointment tomorrow for IV fluid. If he still has intractable nausea despite IVF for the next 2 days, then he should present to ER for potential admission.

## 2011-05-01 NOTE — ED Notes (Signed)
Pt reports having significant nose bleeds due to vomiting.

## 2011-05-01 NOTE — Telephone Encounter (Signed)
On call: wife called as patient has ongoing cough and nausea despite IVF at [facility] today. She will take him to ED.

## 2011-05-01 NOTE — ED Notes (Signed)
Pt medicated for nausea. Family at bedside. Pt stable

## 2011-05-01 NOTE — ED Notes (Signed)
Pt c/o persistent vomiting post chemotherapy. Last tx x 5 days ago, began vomiting 4 days ago. Denies relief from rx'd antiemetics. Unable to take PO or feeding tube meds and fluids.

## 2011-05-01 NOTE — ED Notes (Signed)
Pt states he has experienced uncontrollable vomiting and esophageal pain for the past 36 hours. Pt appears dehydrated and cannot hold anything down. Pt has PEG tube and cannot keep anything down PEG as well.

## 2011-05-01 NOTE — ED Notes (Signed)
Urinal at bedside. Made sure patient was aware need urine sample.

## 2011-05-01 NOTE — Patient Instructions (Signed)
Call if any n/v increase or other problems. Pt aware of radiation appt today and infusion and radiation appts tomorrow. Pt not nauseated upon DC but had not eaten anything in time present in infusion.

## 2011-05-01 NOTE — ED Provider Notes (Signed)
History     CSN: [id number] Arrival date & time 05/01/11  2102 First MD Initiated Contact with Patient 05/01/11 2108    Chief Complaint  Patient presents with  . Emesis    CA pt, chemotherapy pt    HPI The patient presents to the emergency room with complaints of severe persistent vomiting for the last 4 days. Patient is undergoing chemotherapy and radiation treatments for throat cancer. Patient last had a chemotherapy treatment 5 days ago. Ever since then he has been having trouble with nausea and vomiting. He was seen in the emergency department on April 21st for similar symptoms.  He was treated in the emergency department and was eventually released. Patient states he however is has continued to vomit. He was seen in the clinic today and was given a liter of IV fluid as well as antinausea medications. Patient states however, since he went home he started vomiting again. He has not been able to keep any fluids down. He does have a PEG tube and has not been able to control his nausea even with medications administered to her that.  The patient also feels like he is having a lot of esophageal spasms associated with the symptoms. The patient states the emesis has primarily been mucus and yellowish fluid however at times he has noted some blood streaking.   Past Medical History  Diagnosis Date  . Nasal polyps   . 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 Depression   . Fatigue   . Degeneration of lumbar intervertebral disc   . Allergy     seasonal  . Asthma     seasonal  . 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 TMJ (dislocation of temporomandibular joint)   . Sleep apnea     Past Surgical History  Procedure Date  . Anterior release vertebral body w/ posterior fusion     lumbar  . Back surgery 09/1999    L4-5 fusion  . Multiple tooth extractions March 2013    6 teeth removed  . Peg placement March 2013    [facility]  .  Panendoscopy 03/18/2011    Procedure: PANENDOSCOPY;  Surgeon: Flo Shanks, MD;  Location: [facility] OR;  Service: ENT;  Laterality: N/A;  PANDENDONOSCOPY  . Tonsillectomy 03/18/2011    Procedure: TONSILLECTOMY;  Surgeon: Flo Shanks, MD;  Location: [facility] OR;  Service: ENT;  Laterality: Right;    Family History  Problem Relation Age of Onset  . Diabetes Mother   . Hypertension Mother   . Stroke Maternal Grandfather     History  Substance Use Topics  . Smoking status: Never Smoker   . Smokeless tobacco: Former Neurosurgeon    Types: Chew    Quit date: 03/04/1999  . Alcohol Use: Yes     socially      Review of Systems  Constitutional: Negative for fever.  Respiratory: Negative for choking and chest tightness.   Cardiovascular: Negative for chest pain.  Gastrointestinal: Negative for abdominal pain, blood in stool and abdominal distention.  All other systems reviewed and are negative.    Allergies  Contrast media; Iodine; Shellfish allergy; Betadine; Avelox; Morphine and related; Prednisone; and Ultram  Home Medications   Current Outpatient Rx  Name Route Sig Dispense Refill  . DEXAMETHASONE 4 MG PO TABS Oral Take 1 tablet (4 mg total) by mouth 2 (two) times daily with a meal. 10 tablet 0  . DIPHENHYD-HYDROCORT-NYSTATIN MT SUSP Mouth/Throat Use  as directed 15 mLs in the mouth or throat 4 (four) times daily as needed (Swish, gargle and spit as needed for mouth sores). 500 mL 3  . FENTANYL 25 MCG/HR TD PT72 Transdermal Place 1 patch (25 mcg total) onto the skin every 3 (three) days. 10 patch 0  . HYDROCODONE-ACETAMINOPHEN 7.5-500 MG/15ML PO SOLN Oral Take 15 mLs by mouth every 4 (four) hours as needed for pain. 120 mL 0  . LISDEXAMFETAMINE DIMESYLATE 70 MG PO CAPS Oral Take 70 mg by mouth every morning.    . IMODIUM PO Oral Take by mouth. Take 2 tablets after first loose stool, then 1 tablets after each additional loose stool. 8 tablets daily, maximum.    Marland Kitchen LORAZEPAM 0.5 MG PO TABS Oral Take  1 tablet (0.5 mg total) by mouth every 6 (six) hours as needed (Nausea or vomiting). 30 tablet 0  . MELOXICAM 15 MG PO TABS Oral Take 15 mg by mouth daily as needed.     . NYSTATIN 100000 UNIT/ML MT SUSP      . OMEPRAZOLE 20 MG PO CPDR Oral Take 1 capsule (20 mg total) by mouth daily. 30 capsule 2  . ONDANSETRON 8 MG PO TBDP Oral Take 1 tablet (8 mg total) by mouth every 8 (eight) hours as needed for nausea. 20 tablet 0  . ONDANSETRON HCL 8 MG PO TABS Oral Take 8 mg by mouth every 12 (twelve) hours as needed.    Marland Kitchen PROCHLORPERAZINE MALEATE 10 MG PO TABS Oral Take 10 mg by mouth every 6 (six) hours as needed.    Marland Kitchen PROMETHAZINE HCL 25 MG RE SUPP Rectal Place 1 suppository (25 mg total) rectally 2 (two) times daily as needed for nausea. 24 each 0  . SERTRALINE HCL 100 MG PO TABS Oral Take 100 mg by mouth at bedtime.       There were no vitals taken for this visit.  Physical Exam  Nursing note and vitals reviewed. Constitutional: He appears well-developed and well-nourished. He appears distressed.  HENT:  Head: Normocephalic and atraumatic.  Right Ear: External ear normal.  Left Ear: External ear normal.       Erythema posterior pharynx,  Eyes: Conjunctivae are normal. Right eye exhibits no discharge. Left eye exhibits no discharge. No scleral icterus.  Neck: Neck supple. No tracheal deviation present.  Cardiovascular: Normal rate, regular rhythm and intact distal pulses.   Pulmonary/Chest: Effort normal and breath sounds normal. No stridor. No respiratory distress. He has no wheezes. He has no rales.  Abdominal: Soft. Bowel sounds are normal. He exhibits no distension. There is no tenderness. There is no rebound and no guarding.       Frequent, persistent vomiting  Musculoskeletal: He exhibits no edema and no tenderness.  Neurological: He is alert. He has normal strength. No sensory deficit. Cranial nerve deficit:  no gross defecits noted. He exhibits normal muscle tone. He displays no  seizure activity. Coordination normal.  Skin: Skin is warm and dry. No rash noted.  Psychiatric: He has a normal mood and affect.    ED Course  Procedures (including critical care time)  Medications  0.9 %  sodium chloride infusion (1000 mL Intravenous New Bag/Given 05/01/11 2155)    Followed by  0.9 %  sodium chloride infusion (not administered)    Followed by  0.9 %  sodium chloride infusion (not administered)  promethazine (PHENERGAN) injection 12.5 mg (not administered)  ondansetron (ZOFRAN) injection 4 mg (4 mg Intravenous Given 05/01/11 2138)  diazepam (VALIUM) injection 5 mg (5 mg Intravenous Given 05/01/11 2142)    Labs Reviewed  CBC - Abnormal; Notable for the following:    RBC 4.12 (*)    Hemoglobin 12.6 (*)    HCT 36.2 (*)    All other components within normal limits  DIFFERENTIAL - Abnormal; Notable for the following:    Lymphs Abs 0.6 (*)    Monocytes Relative 14 (*)    All other components within normal limits  COMPREHENSIVE METABOLIC PANEL - Abnormal; Notable for the following:    Glucose, Bld 102 (*)    All other components within normal limits  LIPASE, BLOOD  URINALYSIS, ROUTINE W REFLEX MICROSCOPIC   No results found.   1. Chemotherapy induced nausea and vomiting      MDM  Pt is having persistent nausea and vomiting.  Still has not been able to keep down any fluids.  Pt has been given zofran.  Will try dose of phenergan.  IF pt not able to keep down any fluids, will plan on admission for further treatment.  11:41 PM Pt still not feeling much better.  He has had multiple visits without resolution this week.  Will admit for further treatment.        Celene Kras, MD 05/01/11 [phone]

## 2011-05-02 ENCOUNTER — Ambulatory Visit: Payer: BC Managed Care – PPO

## 2011-05-02 ENCOUNTER — Encounter: Payer: Self-pay | Admitting: Oncology

## 2011-05-02 ENCOUNTER — Encounter (HOSPITAL_COMMUNITY): Payer: Self-pay | Admitting: Internal Medicine

## 2011-05-02 DIAGNOSIS — J45909 Unspecified asthma, uncomplicated: Secondary | ICD-10-CM | POA: Diagnosis present

## 2011-05-02 DIAGNOSIS — R112 Nausea with vomiting, unspecified: Secondary | ICD-10-CM | POA: Diagnosis present

## 2011-05-02 LAB — CBC
MCH: 30.1 pg (ref 26.0–34.0)
MCHC: 34 g/dL (ref 30.0–36.0)
MCV: 88.7 fL (ref 78.0–100.0)
Platelets: 195 10*3/uL (ref 150–400)
RDW: 13.3 % (ref 11.5–15.5)

## 2011-05-02 LAB — BASIC METABOLIC PANEL
CO2: 30 mEq/L (ref 19–32)
Calcium: 8.2 mg/dL — ABNORMAL LOW (ref 8.4–10.5)
Creatinine, Ser: 0.99 mg/dL (ref 0.50–1.35)
GFR calc non Af Amer: >90 mL/min (ref >90)
Glucose, Bld: 100 mg/dL — ABNORMAL HIGH (ref 70–99)

## 2011-05-02 MED ORDER — HYDROMORPHONE HCL PF 1 MG/ML IJ SOLN
0.5000 mg | INTRAMUSCULAR | Status: DC | PRN
  Administered 2011-05-02: 0.5 mg via INTRAVENOUS
  Filled 2011-05-02: qty 1

## 2011-05-02 MED ORDER — MAGIC MOUTHWASH W/LIDOCAINE
10.0000 mL | Freq: Four times a day (QID) | ORAL | Status: DC | PRN
  Administered 2011-05-03 – 2011-05-07 (×5): 10 mL via ORAL
  Filled 2011-05-02 (×2): qty 10

## 2011-05-02 MED ORDER — PROMETHAZINE HCL 25 MG/ML IJ SOLN
25.0000 mg | Freq: Four times a day (QID) | INTRAMUSCULAR | Status: DC | PRN
  Administered 2011-05-02 – 2011-05-04 (×7): 25 mg via INTRAVENOUS
  Filled 2011-05-02 (×8): qty 1

## 2011-05-02 MED ORDER — DEXAMETHASONE SODIUM PHOSPHATE 4 MG/ML IJ SOLN
2.0000 mg | Freq: Four times a day (QID) | INTRAMUSCULAR | Status: DC
Start: 2011-05-02 — End: 2011-05-04
  Administered 2011-05-02 – 2011-05-04 (×8): 2 mg via INTRAVENOUS
  Filled 2011-05-02 (×12): qty 0.5

## 2011-05-02 MED ORDER — PANTOPRAZOLE SODIUM 40 MG IV SOLR
40.0000 mg | Freq: Two times a day (BID) | INTRAVENOUS | Status: DC
  Administered 2011-05-02 – 2011-05-07 (×11): 40 mg via INTRAVENOUS
  Filled 2011-05-02 (×11): qty 40

## 2011-05-02 MED ORDER — PANTOPRAZOLE SODIUM 40 MG IV SOLR
40.0000 mg | INTRAVENOUS | Status: DC
  Administered 2011-05-02: 40 mg via INTRAVENOUS
  Filled 2011-05-02 (×3): qty 40

## 2011-05-02 MED ORDER — ENOXAPARIN SODIUM 40 MG/0.4ML ~~LOC~~ SOLN
40.0000 mg | SUBCUTANEOUS | Status: DC
  Administered 2011-05-02 – 2011-05-07 (×6): 40 mg via SUBCUTANEOUS
  Filled 2011-05-02 (×6): qty 0.4

## 2011-05-02 MED ORDER — ENSURE COMPLETE PO LIQD
237.0000 mL | Freq: Every day | ORAL | Status: DC
  Administered 2011-05-02 – 2011-05-07 (×21): 237 mL via ORAL

## 2011-05-02 MED ORDER — FLUCONAZOLE IN SODIUM CHLORIDE 400-0.9 MG/200ML-% IV SOLN
400.0000 mg | INTRAVENOUS | Status: DC
  Administered 2011-05-02 – 2011-05-05 (×4): 400 mg via INTRAVENOUS
  Filled 2011-05-02 (×4): qty 200

## 2011-05-02 MED ORDER — GUAIFENESIN 100 MG/5ML PO SOLN
15.0000 mL | Freq: Four times a day (QID) | ORAL | Status: DC | PRN
  Administered 2011-05-02 – 2011-05-06 (×4): 300 mg
  Filled 2011-05-02 (×4): qty 10
  Filled 2011-05-02: qty 20
  Filled 2011-05-02: qty 10
  Filled 2011-05-02: qty 20
  Filled 2011-05-02: qty 10
  Filled 2011-05-02: qty 20
  Filled 2011-05-02 (×2): qty 10

## 2011-05-02 MED ORDER — MENTHOL 3 MG MT LOZG
1.0000 | LOZENGE | OROMUCOSAL | Status: DC | PRN
  Administered 2011-05-02 – 2011-05-06 (×2): 3 mg via ORAL
  Filled 2011-05-02 (×2): qty 9

## 2011-05-02 MED ORDER — DEXAMETHASONE SODIUM PHOSPHATE 4 MG/ML IJ SOLN
2.0000 mg | Freq: Two times a day (BID) | INTRAMUSCULAR | Status: DC
  Filled 2011-05-02 (×2): qty 0.5

## 2011-05-02 MED ORDER — LORAZEPAM 2 MG/ML IJ SOLN
1.0000 mg | Freq: Four times a day (QID) | INTRAMUSCULAR | Status: DC | PRN
  Administered 2011-05-05 – 2011-05-06 (×3): 1 mg via INTRAVENOUS
  Filled 2011-05-02 (×3): qty 1

## 2011-05-02 NOTE — Progress Notes (Signed)
Subjective: Miserable, nauseous, sore throat, difficulty swallowing,  Objective: Vital signs in last 24 hours: Filed Vitals:   05/01/11 2127 05/02/11 0030 05/02/11 0401 05/02/11 1100  BP: 115/82 120/78 133/71 115/73  Pulse: 102 80 97 79  Temp: 98.3 F (36.8 C) 98.8 F (37.1 C) 98.7 F (37.1 C) 99 F (37.2 C)  TempSrc: Oral Oral Oral Oral  Resp: 22 18 19 18   Height: 5\' 8"  (1.727 m) 5\' 8"  (1.727 m)    Weight: 95.255 kg (210 lb) 95.437 kg (210 lb 6.4 oz)    SpO2: 100% 97% 97% 97%    Intake/Output Summary (Last 24 hours) at 05/02/11 1145 Last data filed at 05/02/11 1100  Gross per 24 hour  Intake      0 ml  Output    800 ml  Net   -800 ml    Weight change:  PSYCH: He is alert and oriented x4; does not appear anxious does not appear depressed; affect is normal  HEENT: Normocephalic and Atraumatic, Mucous membranes pink; PERRLA; EOM intact; Fundi: Benign; No scleral icterus, Nares: Patent, Oropharynx: Clear, Neck: FROM, no cervical lymphadenopathy nor thyromegaly or carotid bruit; no JVD;  Breasts:: Not examined  CHEST WALL: No tenderness  CHEST: Normal respiration, clear to auscultation bilaterally  HEART: Regular rate and rhythm; no murmurs rubs or gallops  BACK: No kyphosis or scoliosis; no CVA tenderness  ABDOMEN: Positive Bowel Sounds, soft non-tender; no masses, no organomegaly, no pannus; no intertriginous candida.  Rectal Exam: Not done  EXTREMITIES: No bone or joint deformity; age-appropriate arthropathy of the hands and knees; no cyanosis, clubbing or edema; no ulcerations.  Genitalia: not examined  PULSES: 2+ and symmetric  SKIN: Normal hydration no rash or ulceration  CNS: Cranial nerves 2-12 grossly intact no focal neurologic deficit    Lab Results: Results for orders placed during the hospital encounter of 05/01/11 (from the past 24 hour(s))  CBC     Status: Abnormal   Collection Time   05/01/11  9:20 PM      Component Value Range   WBC 4.9  4.0 - 10.5  (K/uL)   RBC 4.12 (*) 4.22 - 5.81 (MIL/uL)   Hemoglobin 12.6 (*) 13.0 - 17.0 (g/dL)   HCT 16.1 (*) 09.6 - 52.0 (%)   MCV 87.9  78.0 - 100.0 (fL)   MCH 30.6  26.0 - 34.0 (pg)   MCHC 34.8  30.0 - 36.0 (g/dL)   RDW 04.5  40.9 - 81.1 (%)   Platelets 275  150 - 400 (K/uL)  DIFFERENTIAL     Status: Abnormal   Collection Time   05/01/11  9:20 PM      Component Value Range   Neutrophils Relative 69  43 - 77 (%)   Neutro Abs 3.3  1.7 - 7.7 (K/uL)   Lymphocytes Relative 13  12 - 46 (%)   Lymphs Abs 0.6 (*) 0.7 - 4.0 (K/uL)   Monocytes Relative 14 (*) 3 - 12 (%)   Monocytes Absolute 0.7  0.1 - 1.0 (K/uL)   Eosinophils Relative 4  0 - 5 (%)   Eosinophils Absolute 0.2  0.0 - 0.7 (K/uL)   Basophils Relative 0  0 - 1 (%)   Basophils Absolute 0.0  0.0 - 0.1 (K/uL)  COMPREHENSIVE METABOLIC PANEL     Status: Abnormal   Collection Time   05/01/11  9:20 PM      Component Value Range   Sodium 137  135 - 145 (mEq/L)  Potassium 3.6  3.5 - 5.1 (mEq/L)   Chloride 96  96 - 112 (mEq/L)   CO2 26  19 - 32 (mEq/L)   Glucose, Bld 102 (*) 70 - 99 (mg/dL)   BUN 17  6 - 23 (mg/dL)   Creatinine, Ser 1.61  0.50 - 1.35 (mg/dL)   Calcium 9.5  8.4 - 09.6 (mg/dL)   Total Protein 7.9  6.0 - 8.3 (g/dL)   Albumin 4.2  3.5 - 5.2 (g/dL)   AST 32  0 - 37 (U/L)   ALT 40  0 - 53 (U/L)   Alkaline Phosphatase 67  39 - 117 (U/L)   Total Bilirubin 0.6  0.3 - 1.2 (mg/dL)   GFR calc non Af Amer >90  >90 (mL/min)   GFR calc Af Amer >90  >90 (mL/min)  LIPASE, BLOOD     Status: Normal   Collection Time   05/01/11  9:20 PM      Component Value Range   Lipase 27  11 - 59 (U/L)  URINALYSIS, ROUTINE W REFLEX MICROSCOPIC     Status: Abnormal   Collection Time   05/01/11 11:07 PM      Component Value Range   Color, Urine YELLOW  YELLOW    APPearance CLEAR  CLEAR    Specific Gravity, Urine 1.017  1.005 - 1.030    pH 7.0  5.0 - 8.0    Glucose, UA NEGATIVE  NEGATIVE (mg/dL)   Hgb urine dipstick NEGATIVE  NEGATIVE    Bilirubin  Urine NEGATIVE  NEGATIVE    Ketones, ur 40 (*) NEGATIVE (mg/dL)   Protein, ur NEGATIVE  NEGATIVE (mg/dL)   Urobilinogen, UA 0.2  0.0 - 1.0 (mg/dL)   Nitrite NEGATIVE  NEGATIVE    Leukocytes, UA NEGATIVE  NEGATIVE   BASIC METABOLIC PANEL     Status: Abnormal   Collection Time   05/02/11  4:14 AM      Component Value Range   Sodium 137  135 - 145 (mEq/L)   Potassium 3.5  3.5 - 5.1 (mEq/L)   Chloride 100  96 - 112 (mEq/L)   CO2 30  19 - 32 (mEq/L)   Glucose, Bld 100 (*) 70 - 99 (mg/dL)   BUN 15  6 - 23 (mg/dL)   Creatinine, Ser 0.45  0.50 - 1.35 (mg/dL)   Calcium 8.2 (*) 8.4 - 10.5 (mg/dL)   GFR calc non Af Amer >90  >90 (mL/min)   GFR calc Af Amer >90  >90 (mL/min)  CBC     Status: Abnormal   Collection Time   05/02/11  4:14 AM      Component Value Range   WBC 3.3 (*) 4.0 - 10.5 (K/uL)   RBC 3.45 (*) 4.22 - 5.81 (MIL/uL)   Hemoglobin 10.4 (*) 13.0 - 17.0 (g/dL)   HCT 40.9 (*) 81.1 - 52.0 (%)   MCV 88.7  78.0 - 100.0 (fL)   MCH 30.1  26.0 - 34.0 (pg)   MCHC 34.0  30.0 - 36.0 (g/dL)   RDW 91.4  78.2 - 95.6 (%)   Platelets 195  150 - 400 (K/uL)     Micro: No results found for this or any previous visit (from the past 240 hour(s)).  Studies/Results: No results found.  Medications:  Scheduled Meds:   . sodium chloride  1,000 mL Intravenous Once   Followed by  . sodium chloride  1,000 mL Intravenous Once  . dexamethasone  2 mg Intravenous Q6H  .  diazepam  5 mg Intravenous Once  . feeding supplement  237 mL Oral 6 X Daily  . fluconazole (DIFLUCAN) IV  400 mg Intravenous Q24H  . ondansetron  4 mg Intravenous Once  . pantoprazole (PROTONIX) IV  40 mg Intravenous Q24H  . promethazine  12.5 mg Intravenous Once  . DISCONTD: dexamethasone  2 mg Intravenous Q12H  . DISCONTD: dextrose 5 % and 0.45% NaCl   Intravenous STAT   Continuous Infusions:   . sodium chloride 1,000 mL (05/02/11 0913)  . 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 Candida esophagitis? Start IV Decadron, Ativan if no relief the Decadron and Zofran #2 dysphagia / odynophagia, patient has thrush concern for fungal esophagitis, start fluconazole 400 IV daily #3 nutrition patient takes Ensure via PEG tube, start Ensure, nutrition consult #4   oral mucositis Magic mouthwash Lovenox for DVT prophylaxis   LOS: 1 day   [facility] 05/02/2011, 11:45 AM

## 2011-05-02 NOTE — Progress Notes (Unsigned)
05/02/11 Patient scheduled @ 8am for IVF.  Called patient's cell phone @9am  and left a message  to remind patient of appointment this am and to return call to infusion room.  Repeat call made @ 1145 and similar message left.  1300 patient still has not responded to messages.

## 2011-05-02 NOTE — H&P (Signed)
DATE OF ADMISSION:  05/02/2011  PCP:    Pamelia Hoit, MD, MD   Chief Complaint: Nausea and Vomting   HPI: Kyle Hendrix is an 46 y.o. male with Right Tonsillar SCCa diagnosed in 02/2011 currently undergoing chemotherapy and radiation therapy who presents with complaints of severe intractable nausea and vomiting since his last chemotherapy treatment 3 days ago.  He has not been able to hold down foods or liquids, and was seen in the ED 2 days ago , and treated with IV fluids and anti-emetics, but at home his symptoms continued.  He had his radiation treatment in the AM  And was again treated at the cancer center with IVFs and antiemetics but his symptoms persisted so he was advised to report to the ED for evaluation and further treatment.       Past Medical History  Diagnosis Date  . Nasal polyps   . 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 Depression   . Fatigue   . Degeneration of lumbar intervertebral disc   . Allergy     seasonal  . Asthma     seasonal  . 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 TMJ (dislocation of temporomandibular joint)   . Sleep apnea     Past Surgical History  Procedure Date  . Anterior release vertebral body w/ posterior fusion     lumbar  . Back surgery 09/1999    L4-5 fusion  . Multiple tooth extractions March 2013    6 teeth removed  . Peg placement March 2013    Placed at [facility]  . Panendoscopy 03/18/2011    Procedure: PANENDOSCOPY;  Surgeon: Flo Shanks, MD;  Location: [facility] OR;  Service: ENT;  Laterality: N/A;  PANDENDONOSCOPY  . Tonsillectomy 03/18/2011    Procedure: TONSILLECTOMY;  Surgeon: Flo Shanks, MD;  Location: [facility] OR;  Service: ENT;  Laterality: Right;    Medications:  HOME MEDS: Prior to Admission medications   Medication Sig Start Date End Date Taking? Authorizing Provider  dexamethasone (DECADRON) 4 MG tablet Take 1 tablet (4 mg total) by mouth 2 (two) times daily with a  meal. 05/01/11 05/11/11 Yes Exie Parody, MD  Diphenhyd-Hydrocort-Nystatin SUSP Use as directed 15 mLs in the mouth or throat 4 (four) times daily as needed (Swish, gargle and spit as needed for mouth sores). 04/17/11  Yes Exie Parody, MD  fentaNYL (DURAGESIC - DOSED MCG/HR) 25 MCG/HR Place 1 patch (25 mcg total) onto the skin every 3 (three) days. 04/27/11 05/27/11 Yes Exie Parody, MD  HYDROcodone-acetaminophen (LORTAB) 7.5-500 MG/15ML solution Take 15 mLs by mouth every 4 (four) hours as needed for pain. 04/24/11 05/04/11 Yes Roma Kayser Schorr, NP  lisdexamfetamine (VYVANSE) 70 MG capsule Take 70 mg by mouth every morning.   Yes Historical Provider, MD  Loperamide HCl (IMODIUM PO) Take by mouth. Take 2 tablets after first loose stool, then 1 tablets after each additional loose stool. 8 tablets daily, maximum.   Yes Historical Provider, MD  LORazepam (ATIVAN) 0.5 MG tablet Take 1 tablet (0.5 mg total) by mouth every 6 (six) hours as needed (Nausea or vomiting). 03/04/11 08/31/11 Yes Exie Parody, MD  meloxicam (MOBIC) 15 MG tablet Take 15 mg by mouth daily as needed.  03/31/11  Yes Historical Provider, MD  nystatin (MYCOSTATIN) 100000 UNIT/ML suspension  04/17/11  Yes Historical Provider, MD  omeprazole (PRILOSEC) 20 MG capsule Take 1 capsule (20 mg total) by mouth daily.  04/24/11 04/23/12 Yes Myrtis Ser, NP  ondansetron (ZOFRAN ODT) 8 MG disintegrating tablet Take 1 tablet (8 mg total) by mouth every 8 (eight) hours as needed for nausea. 04/24/11 05/01/11 Yes Roma Kayser Schorr, NP  prochlorperazine (COMPAZINE) 10 MG tablet Take 10 mg by mouth every 6 (six) hours as needed.   Yes Historical Provider, MD  promethazine (PHENERGAN) 25 MG suppository Place 1 suppository (25 mg total) rectally 2 (two) times daily as needed for nausea. 05/01/11 05/08/11 Yes Exie Parody, MD  sertraline (ZOLOFT) 100 MG tablet Take 100 mg by mouth at bedtime.    Yes Historical Provider, MD    Allergies:  Allergies  Allergen Reactions  .  Contrast Media (Iodinated Diagnostic Agents) Anaphylaxis    Not recommended due to severe Iodine allergy.  . Iodine Anaphylaxis  . Shellfish Allergy Anaphylaxis  . Betadine (Povidone Iodine) Rash  . Avelox (Moxifloxacin Hcl In Nacl) Hives  . Morphine And Related Hives  . Prednisone Other (See Comments)    Altered mental status  . Ultram (Tramadol Hcl) Hives    Social History:   reports that he has never smoked. He quit smokeless tobacco use about 12 years ago. His smokeless tobacco use included Chew. He reports that he drinks alcohol. He reports that he does not use illicit drugs.  Family History: Family History  Problem Relation Age of Onset  . Diabetes Mother   . Hypertension Mother   . Stroke Maternal Grandfather     Review of Systems:  The patient denies anorexia, fever, weight loss, vision loss, decreased hearing, hoarseness, chest pain, syncope, dyspnea on exertion, peripheral edema, balance deficits, hemoptysis, abdominal pain, melena, hematochezia, severe indigestion/heartburn, hematuria, incontinence, genital sores, muscle weakness, suspicious skin lesions, transient blindness, difficulty walking, depression, unusual weight change, abnormal bleeding, enlarged lymph nodes, angioedema, and breast masses.   Physical Exam:  GEN:  Pleasant 46 year old well nourished and well developed Caucasian male examined  and in no acute distress; cooperative with exam Filed Vitals:   05/01/11 2127  BP: 115/82  Pulse: 102  Temp: 98.3 F (36.8 C)  TempSrc: Oral  Resp: 22  Height: 5\' 8"  (1.727 m)  Weight: 95.255 kg (210 lb)  SpO2: 100%   Blood pressure 115/82, pulse 102, temperature 98.3 F (36.8 C), temperature source Oral, resp. rate 22, height 5\' 8"  (1.727 m), weight 95.255 kg (210 lb), SpO2 100.00%. PSYCH: He is alert and oriented x4; does not appear anxious does not appear depressed; affect is normal HEENT: Normocephalic and Atraumatic, Mucous membranes pink; PERRLA; EOM intact;  Fundi:  Benign;  No scleral icterus, Nares: Patent, Oropharynx: Clear,  Neck:  FROM, no cervical lymphadenopathy nor thyromegaly or carotid bruit; no JVD; Breasts:: Not examined CHEST WALL: No tenderness CHEST: Normal respiration, clear to auscultation bilaterally HEART: Regular rate and rhythm; no murmurs rubs or gallops BACK: No kyphosis or scoliosis; no CVA tenderness ABDOMEN: Positive Bowel Sounds, soft non-tender; no masses, no organomegaly, no pannus; no intertriginous candida. Rectal Exam: Not done EXTREMITIES: No bone or joint deformity; age-appropriate arthropathy of the hands and knees; no cyanosis, clubbing or edema; no ulcerations. Genitalia: not examined PULSES: 2+ and symmetric SKIN: Normal hydration no rash or ulceration CNS: Cranial nerves 2-12 grossly intact no focal neurologic deficit   Labs & Imaging Results for orders placed during the hospital encounter of 05/01/11 (from the past 48 hour(s))  CBC     Status: Abnormal   Collection Time   05/01/11  9:20 PM  Component Value Range Comment   WBC 4.9  4.0 - 10.5 (K/uL)    RBC 4.12 (*) 4.22 - 5.81 (MIL/uL)    Hemoglobin 12.6 (*) 13.0 - 17.0 (g/dL)    HCT 16.1 (*) 09.6 - 52.0 (%)    MCV 87.9  78.0 - 100.0 (fL)    MCH 30.6  26.0 - 34.0 (pg)    MCHC 34.8  30.0 - 36.0 (g/dL)    RDW 04.5  40.9 - 81.1 (%)    Platelets 275  150 - 400 (K/uL)   DIFFERENTIAL     Status: Abnormal   Collection Time   05/01/11  9:20 PM      Component Value Range Comment   Neutrophils Relative 69  43 - 77 (%)    Neutro Abs 3.3  1.7 - 7.7 (K/uL)    Lymphocytes Relative 13  12 - 46 (%)    Lymphs Abs 0.6 (*) 0.7 - 4.0 (K/uL)    Monocytes Relative 14 (*) 3 - 12 (%)    Monocytes Absolute 0.7  0.1 - 1.0 (K/uL)    Eosinophils Relative 4  0 - 5 (%)    Eosinophils Absolute 0.2  0.0 - 0.7 (K/uL)    Basophils Relative 0  0 - 1 (%)    Basophils Absolute 0.0  0.0 - 0.1 (K/uL)   COMPREHENSIVE METABOLIC PANEL     Status: Abnormal   Collection Time    05/01/11  9:20 PM      Component Value Range Comment   Sodium 137  135 - 145 (mEq/L)    Potassium 3.6  3.5 - 5.1 (mEq/L)    Chloride 96  96 - 112 (mEq/L)    CO2 26  19 - 32 (mEq/L)    Glucose, Bld 102 (*) 70 - 99 (mg/dL)    BUN 17  6 - 23 (mg/dL)    Creatinine, Ser 9.14  0.50 - 1.35 (mg/dL)    Calcium 9.5  8.4 - 10.5 (mg/dL)    Total Protein 7.9  6.0 - 8.3 (g/dL)    Albumin 4.2  3.5 - 5.2 (g/dL)    AST 32  0 - 37 (U/L)    ALT 40  0 - 53 (U/L)    Alkaline Phosphatase 67  39 - 117 (U/L)    Total Bilirubin 0.6  0.3 - 1.2 (mg/dL)    GFR calc non Af Amer >90  >90 (mL/min)    GFR calc Af Amer >90  >90 (mL/min)   LIPASE, BLOOD     Status: Normal   Collection Time   05/01/11  9:20 PM      Component Value Range Comment   Lipase 27  11 - 59 (U/L)   URINALYSIS, ROUTINE W REFLEX MICROSCOPIC     Status: Abnormal   Collection Time   05/01/11 11:07 PM      Component Value Range Comment   Color, Urine YELLOW  YELLOW     APPearance CLEAR  CLEAR     Specific Gravity, Urine 1.017  1.005 - 1.030     pH 7.0  5.0 - 8.0     Glucose, UA NEGATIVE  NEGATIVE (mg/dL)    Hgb urine dipstick NEGATIVE  NEGATIVE     Bilirubin Urine NEGATIVE  NEGATIVE     Ketones, ur 40 (*) NEGATIVE (mg/dL)    Protein, ur NEGATIVE  NEGATIVE (mg/dL)    Urobilinogen, UA 0.2  0.0 - 1.0 (mg/dL)    Nitrite NEGATIVE  NEGATIVE  Leukocytes, UA NEGATIVE  NEGATIVE  MICROSCOPIC NOT DONE ON URINES WITH NEGATIVE PROTEIN, BLOOD, LEUKOCYTES, NITRITE, OR GLUCOSE <1000 mg/dL.   No results found.    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    CODE  STATUS:      FULL CODE         Paulette Rockford C 05/02/2011, 12:20 AM

## 2011-05-03 ENCOUNTER — Observation Stay (HOSPITAL_COMMUNITY): Payer: BC Managed Care – PPO

## 2011-05-03 LAB — CBC
MCHC: 35.3 g/dL (ref 30.0–36.0)
Platelets: 200 10*3/uL (ref 150–400)
RDW: 12.9 % (ref 11.5–15.5)
WBC: 4.1 10*3/uL (ref 4.0–10.5)

## 2011-05-03 LAB — DIFFERENTIAL
Basophils Absolute: 0 10*3/uL (ref 0.0–0.1)
Basophils Relative: 0 % (ref 0–1)
Lymphocytes Relative: 4 % — ABNORMAL LOW (ref 12–46)
Monocytes Absolute: 0.3 10*3/uL (ref 0.1–1.0)
Neutro Abs: 3.6 10*3/uL (ref 1.7–7.7)
Neutrophils Relative %: 88 % — ABNORMAL HIGH (ref 43–77)

## 2011-05-03 MED ORDER — FENTANYL 25 MCG/HR TD PT72
25.0000 ug | MEDICATED_PATCH | TRANSDERMAL | Status: DC
  Administered 2011-05-03 – 2011-05-06 (×2): 25 ug via TRANSDERMAL
  Filled 2011-05-03 (×2): qty 1

## 2011-05-03 MED ORDER — PRO-STAT SUGAR FREE PO LIQD
30.0000 mL | Freq: Every day | ORAL | Status: DC
  Administered 2011-05-03 – 2011-05-07 (×3): 30 mL
  Filled 2011-05-03 (×5): qty 30

## 2011-05-03 MED ORDER — SUCRALFATE 1 GM/10ML PO SUSP
1.0000 g | Freq: Three times a day (TID) | ORAL | Status: DC
  Filled 2011-05-03 (×19): qty 10

## 2011-05-03 MED ORDER — GUAIFENESIN 100 MG/5ML PO SYRP
200.0000 mg | ORAL_SOLUTION | Freq: Four times a day (QID) | ORAL | Status: DC
  Administered 2011-05-03 (×3): 200 mg via ORAL
  Filled 2011-05-03 (×10): qty 118

## 2011-05-03 MED ORDER — HYDROCODONE-ACETAMINOPHEN 7.5-500 MG/15ML PO SOLN
15.0000 mL | ORAL | Status: DC | PRN
  Administered 2011-05-03 – 2011-05-07 (×12): 15 mL
  Filled 2011-05-03 (×12): qty 15

## 2011-05-03 NOTE — Progress Notes (Signed)
INITIAL ADULT NUTRITION ASSESSMENT Date: 05/03/2011   Time: 12:40 PM  Reason for Assessment: Consult, Tube feeding  ASSESSMENT: Male 46 y.o.  Dx: Nausea & vomiting  Hx:  Past Medical History  Diagnosis Date  . Nasal polyps   . 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 Depression   . Fatigue   . Degeneration of lumbar intervertebral disc   . Allergy     seasonal  . Asthma     seasonal  . 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 TMJ (dislocation of temporomandibular joint)   . Sleep apnea    Related Meds:     . dexamethasone  2 mg Intravenous Q6H  . enoxaparin (LOVENOX) injection  40 mg Subcutaneous Q24H  . feeding supplement  237 mL Oral 6 X Daily  . fluconazole (DIFLUCAN) IV  400 mg Intravenous Q24H  . guaifenesin  200 mg Oral QID  . pantoprazole (PROTONIX) IV  40 mg Intravenous Q12H  . sucralfate  1 g Oral TID WC & HS  . DISCONTD: pantoprazole (PROTONIX) IV  40 mg Intravenous Q24H    Ht: 5\' 8"  (172.7 cm)  Wt: 210 lb 6.4 oz (95.437 kg)  Ideal Wt: 70 kg % Ideal Wt: 136%  Usual Wt: 96.8 kg % Usual Wt: 99%  Body mass index is 31.99 kg/(m^2)., obesity unspecified  Food/Nutrition Related Hx: Patient with right tonsillar squamous cell carcinoma diagnosed 02/2011. He is currently undergoing chemotherapy and radiation. Admitted with sever intractable nausea and vomiting. Patient has been meeting with [facility], who is managing nutrition. Patient has PEG and was taking 6 cans Ensure Plus daily. He has also been adding protein powder to meet nutrition needs. Patient reports that he had been doing well with the Ensure until recently, when he hasn't been able to keep anything down. He states that this has improved since admission.   Per [facility] note, patient has lost ~20 pounds since last month due to treatment. This meets the criteria for severe malnutrition r/t chronic illness with 10% weight loss over 1 month and <75%  of estimated energy requirements.   Labs:  CMP     Component Value Date/Time   NA 137 05/02/2011 0414   K 3.5 05/02/2011 0414   CL 100 05/02/2011 0414   CO2 30 05/02/2011 0414   GLUCOSE 100* 05/02/2011 0414   BUN 15 05/02/2011 0414   CREATININE 0.99 05/02/2011 0414   CALCIUM 8.2* 05/02/2011 0414   PROT 7.9 05/01/2011 2120   ALBUMIN 4.2 05/01/2011 2120   AST 32 05/01/2011 2120   ALT 40 05/01/2011 2120   ALKPHOS 67 05/01/2011 2120   BILITOT 0.6 05/01/2011 2120   GFRNONAA >90 05/02/2011 0414   GFRAA >90 05/02/2011 0414     Intake/Output Summary (Last 24 hours) at 05/03/11 1244 Last data filed at 05/03/11 1000  Gross per 24 hour  Intake   3754 ml  Output   2000 ml  Net   1754 ml    Diet Order:  Clear liquids  Supplements/Tube Feeding: Ensure Complete 6 times daily via PEG, which provides 2100 kcal, 78 g protein, and 1079 ml free water.   IVF:    sodium chloride Last Rate: 1,000 mL (05/02/11 2028)  DISCONTD: sodium chloride Last Rate: 100 mL/hr at 05/02/11 0000    Estimated Nutritional Needs:   Kcal:2000-2200 kcal Protein:95-104 g Fluid:2.5 L  NUTRITION DIAGNOSIS: -Inadequate protein intake (NI-5.7.1).  Status: Ongoing  RELATED TO:  increased needs due to cancer treatment  AS EVIDENCE BY: patient meeting 82% of protein needs.   MONITORING/EVALUATION(Goals): Patient will meet 100% of estimated nutritional needs with enteral nutrition.   Monitor: TF tolerance and adequacy, weight trends, labs  EDUCATION NEEDS: -No education needs identified at this time  INTERVENTION: Add 30 mL Prostat once daily via PEG to meet estimated protein needs. Total enteral nutrition to provide 2200 kcal, 93 g protein.    DOCUMENTATION CODES Per approved criteria  -Severe malnutrition in the context of chronic illness    Fabio Pierce 05/03/2011, 12:40 PM

## 2011-05-03 NOTE — Progress Notes (Signed)
Subjective: Miserable, nauseous, sore throat, difficulty swallowing,      Objective: Vital signs in last 24 hours: Filed Vitals:   05/02/11 2026 05/03/11 0158 05/03/11 0458 05/03/11 1041  BP: 122/74 116/71 126/76 133/80  Pulse: 73 73 68 75  Temp: 98.2 F (36.8 C) 98 F (36.7 C) 97.9 F (36.6 C) 98.7 F (37.1 C)  TempSrc: Oral Oral Oral Oral  Resp: 18 18 16 18   Height:      Weight:      SpO2: 97% 96% 99% 99%    Intake/Output Summary (Last 24 hours) at 05/03/11 1140 Last data filed at 05/03/11 1000  Gross per 24 hour  Intake   3754 ml  Output   2000 ml  Net   1754 ml    Weight change:   PSYCH: He is alert and oriented x4; does not appear anxious does not appear depressed; affect is normal  HEENT: Normocephalic and Atraumatic, Mucous membranes pink; PERRLA; EOM intact; Fundi: Benign; No scleral icterus, Nares: Patent, Oropharynx: Clear, Neck: FROM, no cervical lymphadenopathy nor thyromegaly or carotid bruit; no JVD;  Breasts:: Not examined  CHEST WALL: No tenderness  CHEST: Normal respiration, clear to auscultation bilaterally  HEART: Regular rate and rhythm; no murmurs rubs or gallops  BACK: No kyphosis or scoliosis; no CVA tenderness  ABDOMEN: Positive Bowel Sounds, soft non-tender; no masses, no organomegaly, no pannus; no intertriginous candida.  Rectal Exam: Not done  EXTREMITIES: No bone or joint deformity; age-appropriate arthropathy of the hands and knees; no cyanosis, clubbing or edema; no ulcerations.  Genitalia: not examined  PULSES: 2+ and symmetric  SKIN: Normal hydration no rash or ulceration  CNS: Cranial nerves 2-12 grossly intact no focal neurologic deficit   Lab Results: No results found for this or any previous visit (from the past 24 hour(s)).   Micro: No results found for this or any previous visit (from the past 240 hour(s)).  Studies/Results: No results found.  Medications:  Scheduled Meds:   . dexamethasone  2 mg Intravenous Q6H  .  enoxaparin (LOVENOX) injection  40 mg Subcutaneous Q24H  . feeding supplement  237 mL Oral 6 X Daily  . fluconazole (DIFLUCAN) IV  400 mg Intravenous Q24H  . guaifenesin  200 mg Oral QID  . pantoprazole (PROTONIX) IV  40 mg Intravenous Q12H  . DISCONTD: pantoprazole (PROTONIX) IV  40 mg Intravenous Q24H   Continuous Infusions:   . sodium chloride 1,000 mL (05/02/11 2028)  . 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 Candida esophagitis? Start IV Decadron, Ativan if no relief the Decadron and Zofran , continues to have difficulty swallowing, will consult GI today disease Egd  is indicated  #2 dysphagia / odynophagia, patient has thrush concern for fungal esophagitis, started fluconazole 400 IV daily , without improvement  #3 nutrition patient takes Ensure via PEG tube, start Ensure, nutrition consult to see his 2 feedings need to be changed #4 oral mucositis Magic mouthwash  Lovenox for DVT prophylaxis    LOS: 2 days   [facility] 05/03/2011, 11:40 AM

## 2011-05-04 ENCOUNTER — Ambulatory Visit: Payer: Self-pay | Admitting: Oncology

## 2011-05-04 ENCOUNTER — Other Ambulatory Visit: Payer: Self-pay | Admitting: Lab

## 2011-05-04 ENCOUNTER — Ambulatory Visit
Admission: RE | Admit: 2011-05-04 | Discharge: 2011-05-04 | Disposition: A | Discharge status: Home / Self Care | Payer: BC Managed Care – PPO | Source: Ambulatory Visit | Attending: Radiation Oncology | Admitting: Radiation Oncology

## 2011-05-04 DIAGNOSIS — K121 Other forms of stomatitis: Secondary | ICD-10-CM

## 2011-05-04 DIAGNOSIS — C099 Malignant neoplasm of tonsil, unspecified: Secondary | ICD-10-CM

## 2011-05-04 DIAGNOSIS — R112 Nausea with vomiting, unspecified: Principal | ICD-10-CM

## 2011-05-04 DIAGNOSIS — B977 Papillomavirus as the cause of diseases classified elsewhere: Secondary | ICD-10-CM

## 2011-05-04 MED ORDER — PROMETHAZINE HCL 25 MG RE SUPP: 25.0000 mg | Freq: Four times a day (QID) | RECTAL | Status: DC | PRN

## 2011-05-04 MED ORDER — DEXAMETHASONE SODIUM PHOSPHATE 4 MG/ML IJ SOLN
4.0000 mg | Freq: Two times a day (BID) | INTRAMUSCULAR | Status: DC
  Administered 2011-05-04 – 2011-05-07 (×6): 4 mg via INTRAVENOUS
  Filled 2011-05-04 (×7): qty 1

## 2011-05-04 MED ORDER — GUAIFENESIN 100 MG/5ML PO SYRP
200.0000 mg | ORAL_SOLUTION | Freq: Four times a day (QID) | ORAL | Status: DC
  Administered 2011-05-04 – 2011-05-07 (×13): 200 mg via ORAL
  Filled 2011-05-04 (×5): qty 118

## 2011-05-04 MED ORDER — HYDROGEN PEROXIDE 3 % EX SOLN
Freq: Four times a day (QID) | CUTANEOUS | Status: DC
  Filled 2011-05-04: qty 473

## 2011-05-04 MED ORDER — SODIUM CHLORIDE 0.9 % IV SOLN
1000.0000 mL | INTRAVENOUS | Status: DC
  Administered 2011-05-04 – 2011-05-06 (×5): 1000 mL via INTRAVENOUS

## 2011-05-04 MED ORDER — PROCHLORPERAZINE MALEATE 10 MG PO TABS: 10.0000 mg | ORAL_TABLET | Freq: Four times a day (QID) | ORAL | Status: DC | PRN

## 2011-05-04 MED ORDER — LORAZEPAM 0.5 MG PO TABS: 0.5000 mg | ORAL_TABLET | Freq: Four times a day (QID) | ORAL | Status: DC | PRN

## 2011-05-04 NOTE — Progress Notes (Signed)
[facility] Radiation Oncology Dept Therapy Treatment Record Phone [phone]   Radiation Therapy was administered to Kyle Hendrix on: 05/04/2011  10:26 AM and was treatment # 20 out of a planned course of 33 treatments.

## 2011-05-04 NOTE — Progress Notes (Signed)
Subjective: Nausea and vomiting is improving   Objective: Vital signs in last 24 hours: Filed Vitals:   05/03/11 1749 05/03/11 2026 05/04/11 0153 05/04/11 0526  BP: 114/69 121/75 100/75 112/62  Pulse: 84 78 70 73  Temp: 98.8 F (37.1 C) 99.1 F (37.3 C) 98.2 F (36.8 C) 97.9 F (36.6 C)  TempSrc: Oral Oral Oral Oral  Resp: 18 18 16 16   Height:      Weight:      SpO2: 97% 97% 97% 96%    Intake/Output Summary (Last 24 hours) at 05/04/11 0454 Last data filed at 05/04/11 0600  Gross per 24 hour  Intake   2533 ml  Output   1800 ml  Net    733 ml    Weight change:   PSYCH: He is alert and oriented x4; does not appear anxious does not appear depressed; affect is normal  HEENT: Normocephalic and Atraumatic, Mucous membranes pink; PERRLA; EOM intact; Fundi: Benign; No scleral icterus, Nares: Patent, Oropharynx: Clear, Neck: FROM, no cervical lymphadenopathy nor thyromegaly or carotid bruit; no JVD;  Breasts:: Not examined  CHEST WALL: No tenderness  CHEST: Normal respiration, clear to auscultation bilaterally  HEART: Regular rate and rhythm; no murmurs rubs or gallops  BACK: No kyphosis or scoliosis; no CVA tenderness  ABDOMEN: Positive Bowel Sounds, soft non-tender; no masses, no organomegaly, no pannus; no intertriginous candida.  Rectal Exam: Not done  EXTREMITIES: No bone or joint deformity; age-appropriate arthropathy of the hands and knees; no cyanosis, clubbing or edema; no ulcerations.  Genitalia: not examined  PULSES: 2+ and symmetric  SKIN: Normal hydration no rash or ulceration  CNS: Cranial nerves 2-12 grossly intact no focal neurologic deficit    Lab Results: Results for orders placed during the hospital encounter of 05/01/11 (from the past 24 hour(s))  CBC     Status: Abnormal   Collection Time   05/03/11 12:45 PM      Component Value Range   WBC 4.1  4.0 - 10.5 (K/uL)   RBC 3.74 (*) 4.22 - 5.81 (MIL/uL)   Hemoglobin 11.4 (*) 13.0 - 17.0 (g/dL)   HCT 09.8  (*) 11.9 - 52.0 (%)   MCV 86.4  78.0 - 100.0 (fL)   MCH 30.5  26.0 - 34.0 (pg)   MCHC 35.3  30.0 - 36.0 (g/dL)   RDW 14.7  82.9 - 56.2 (%)   Platelets 200  150 - 400 (K/uL)  DIFFERENTIAL     Status: Abnormal   Collection Time   05/03/11 12:45 PM      Component Value Range   Neutrophils Relative 88 (*) 43 - 77 (%)   Neutro Abs 3.6  1.7 - 7.7 (K/uL)   Lymphocytes Relative 4 (*) 12 - 46 (%)   Lymphs Abs 0.2 (*) 0.7 - 4.0 (K/uL)   Monocytes Relative 7  3 - 12 (%)   Monocytes Absolute 0.3  0.1 - 1.0 (K/uL)   Eosinophils Relative 0  0 - 5 (%)   Eosinophils Absolute 0.0  0.0 - 0.7 (K/uL)   Basophils Relative 0  0 - 1 (%)   Basophils Absolute 0.0  0.0 - 0.1 (K/uL)     Micro: No results found for this or any previous visit (from the past 240 hour(s)).  Studies/Results: Dg Abd 1 View  05/03/2011  *RADIOLOGY REPORT*  Clinical Data: Evaluate for ileus  ABDOMEN - 1 VIEW  Comparison: 10/25/2011  Findings: Within the left upper quadrant of the abdomen and there  is a gastrostomy tube.  The bowel gas pattern appears nonobstructed.  No dilated loops of small bowel or fluid levels noted.  Some colonic gas is noted.  Prior lower lumbar spine laminectomy and fusion noted.  IMPRESSION:  1.  Nonobstructive bowel gas pattern.  Original Report Authenticated By: Rosealee Albee, M.D.    Medications:  Scheduled Meds:   . dexamethasone  2 mg Intravenous Q6H  . enoxaparin (LOVENOX) injection  40 mg Subcutaneous Q24H  . feeding supplement  237 mL Oral 6 X Daily  . feeding supplement  30 mL Per Tube Daily  . fentaNYL  25 mcg Transdermal Q72H  . fluconazole (DIFLUCAN) IV  400 mg Intravenous Q24H  . guaifenesin  200 mg Oral Q6H  . pantoprazole (PROTONIX) IV  40 mg Intravenous Q12H  . sucralfate  1 g Oral TID WC & HS  . DISCONTD: guaifenesin  200 mg Oral QID   Continuous Infusions:   . sodium chloride 1,000 mL (05/02/11 2028)  . 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 Candida esophagitis? Start IV Decadron, Ativan if no relief the Decadron and Zofran , continues to have difficulty swallowing, I spoke to Dr. Melvia Heaps yesterday from GI, the plan is to try Carafate slurry, fentanyl patch, if no improvement consider EGD and GI consultation  #2 dysphagia / odynophagia, patient has thrush concern for fungal esophagitis, started fluconazole 400 IV daily , minimal improvement  #3 nutrition patient takes Ensure via PEG tube, start Ensure, nutrition consult to see his 2 feedings need to be changed , add pro stat   #4 oral mucositis Magic mouthwash  Lovenox for DVT prophylaxis    LOS: 3 days   [facility] 05/04/2011, 8:08 AM

## 2011-05-04 NOTE — Consult Note (Signed)
[facility] INPATIENT PROGRESS NOTE  Name: Kyle Hendrix      MRN: [id number]    Location: 1321/1321-01  Date: 05/04/2011 Time:1:07 PM   Subjective: Interval History:Kyle Hendrix had nausea despite IVF and IV antiemetics in the Cancer Center on Friday 05/01/2011.  Thus, he presented to the ED for evaluation that night and has been admitted since then.  Since admission, he has been getting aggressive IVF and IV antiemetics with improvement of his nausea/vomiting.  However, this morning, he brush his teeth and incidentally gagged himself; he has had 2 episodes of vomiting today.  He still has mouth sore, mucositis, thick phlegm.  His PEG tube is without problem.   Objective: Vital signs in last 24 hours: Temp:  [97.9 F (36.6 C)-99.1 F (37.3 C)] 98.3 F (36.8 C) (04/29 1010) Pulse Rate:  [70-92] 92  (04/29 1010) Resp:  [16-20] 20  (04/29 1010) BP: (100-121)/(62-84) 113/84 mmHg (04/29 1010) SpO2:  [95 %-97 %] 95 % (04/29 1010)    Intake/Output from previous day: 04/28 0701 - 04/29 0700 In: 2533 [I.V.:1219] Out: 1800 [Urine:1800]    PHYSICAL EXAM:  General: well-nourished in no acute distress. Eyes: no scleral icterus. ENT: There were no oropharyngeal lesions. There was radiation-induced changes with erythema and white fibrosis; but no active thrush. Neck was without thyromegaly. Skin of the neck was slightly red but no opened wound or active discharge or pain on palpation. Lymphatics: Positive for a 2x2cm right cervical node adenopathy.  Respiratory: lungs were clear bilaterally without wheezing or crackles. Cardiovascular: Regular rate and rhythm, S1/S2, without murmur, rub or gallop. There was no pedal edema. GI: abdomen was soft, flat, nontender, nondistended, without organomegaly. PEG tube in place without erythema, purulent discharge, pain on palpation. Muscoloskeletal: no spinal tenderness of palpation of vertebral spine. Skin exam was without echymosis, petichae.  Neuro exam was nonfocal.  Patient was alerted and oriented. Attention was good. Language was appropriate. Mood was normal without depression. Speech was not pressured. Thought content was not tangential.    Studies/Results: Results for orders placed during the hospital encounter of 05/01/11 (from the past 48 hour(s))  CBC     Status: Abnormal   Collection Time   05/03/11 12:45 PM      Component Value Range Comment   WBC 4.1  4.0 - 10.5 (K/uL)    RBC 3.74 (*) 4.22 - 5.81 (MIL/uL)    Hemoglobin 11.4 (*) 13.0 - 17.0 (g/dL)    HCT 52.8 (*) 41.3 - 52.0 (%)    MCV 86.4  78.0 - 100.0 (fL)    MCH 30.5  26.0 - 34.0 (pg)    MCHC 35.3  30.0 - 36.0 (g/dL)    RDW 24.4  01.0 - 27.2 (%)    Platelets 200  150 - 400 (K/uL)   DIFFERENTIAL     Status: Abnormal   Collection Time   05/03/11 12:45 PM      Component Value Range Comment   Neutrophils Relative 88 (*) 43 - 77 (%)    Neutro Abs 3.6  1.7 - 7.7 (K/uL)    Lymphocytes Relative 4 (*) 12 - 46 (%)    Lymphs Abs 0.2 (*) 0.7 - 4.0 (K/uL)    Monocytes Relative 7  3 - 12 (%)    Monocytes Absolute 0.3  0.1 - 1.0 (K/uL)    Eosinophils Relative 0  0 - 5 (%)    Eosinophils Absolute 0.0  0.0 - 0.7 (K/uL)  Basophils Relative 0  0 - 1 (%)    Basophils Absolute 0.0  0.0 - 0.1 (K/uL)    Dg Abd 1 View  05/03/2011  *RADIOLOGY REPORT*  Clinical Data: Evaluate for ileus  ABDOMEN - 1 VIEW  Comparison: 10/25/2011  Findings: Within the left upper quadrant of the abdomen and there is a gastrostomy tube.  The bowel gas pattern appears nonobstructed.  No dilated loops of small bowel or fluid levels noted.  Some colonic gas is noted.  Prior lower lumbar spine laminectomy and fusion noted.  IMPRESSION:  1.  Nonobstructive bowel gas pattern.  Original Report Authenticated By: Rosealee Albee, M.D.     MEDICATIONS: reviewed.     Assessment/Plan:    1. 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 S/p 2 doses of q3wk Cisplatin (last given on 04/28/2011) and daily radiation.  I advised him to continue daily radiation per Rad Onc.   2. Intractable nausea/vomiting: this is due to recent chemo.  There is no sign of obstruction.  His nausea/vomiting is slightly improved today compared to 3 days ago.  I advised him to switch back to outpatient antiemetics again with Zofran/Compazine/Ativan PO prn and Phenergan PR prn.  He is still on day 4 out of 5 of Dexamathasone IV for delayed nausea/vomiting.  Only if his nausea is severe, would I recommend reverting back to IV antiemetics.  This way, we may be able to see how he does with outpatient antiemetics with goal for discharge the next 24-48 hours.   3. Mucositis/xerostomia: Continue salt/baking soda rinses and alternate with diluted peroxide.   4. Protein calorie malnutrition: continue PEG tube feed.   5. Mucositis pain control: on elixir pain meds as needed for moderate and IV pain med for severe pain prn.   6.  Code status:  He said that he had a living will and does not want to be in vegetative state; however, he would still like to be resuscitated when there is a reversible cause.  Since he is young, and does not have metastatic disease (stage IVA head/neck cancer is still curable), I agree with his decision to convert from DNR to full code.  I discussed the case with Dr. Susie Cassette and changed his code status to FULL CODE.

## 2011-05-05 ENCOUNTER — Encounter: Payer: Self-pay | Admitting: *Deleted

## 2011-05-05 ENCOUNTER — Ambulatory Visit
Admission: RE | Admit: 2011-05-05 | Discharge: 2011-05-05 | Disposition: A | Discharge status: Home / Self Care | Payer: BC Managed Care – PPO | Source: Ambulatory Visit | Attending: Radiation Oncology | Admitting: Radiation Oncology

## 2011-05-05 MED ORDER — FLUCONAZOLE 100MG IVPB
100.0000 mg | INTRAVENOUS | Status: DC
  Administered 2011-05-06 – 2011-05-07 (×2): 100 mg via INTRAVENOUS
  Filled 2011-05-05 (×2): qty 50

## 2011-05-05 NOTE — Progress Notes (Signed)
Office visit note from 04/27/11 from Dr. Lazarus Salines received and forwarded to Dr. Gaylyn Rong

## 2011-05-05 NOTE — Progress Notes (Signed)
[facility] Radiation Oncology Dept Therapy Treatment Record Phone [phone]   Radiation Therapy was administered to Kyle Hendrix on: 05/05/2011  4:48 PM and was treatment # 21 out of a planned course of 33 treatments.

## 2011-05-05 NOTE — Progress Notes (Signed)
Subjective: Tolerated full liquid diet Kyle Hendrix [facility] had nausea despite IVF and IV antiemetics in the Cancer Center on Friday 05/01/2011. Thus, he presented to the ED for evaluation that night and has been admitted since then. Since admission, he has been getting aggressive IVF and IV antiemetics with improvement of his nausea/vomiting.   Objective: Vital signs in last 24 hours: Filed Vitals:   05/04/11 1820 05/04/11 2040 05/05/11 0155 05/05/11 0550  BP: 111/77 118/78 122/81 131/82  Pulse: 68 68 71 68  Temp: 98.6 F (37 C) 98 F (36.7 C) 98.5 F (36.9 C) 98.2 F (36.8 C)  TempSrc: Oral Oral Oral Oral  Resp: 18 18 18 17   Height:      Weight:      SpO2: 96% 98% 99% 95%    Intake/Output Summary (Last 24 hours) at 05/05/11 0913 Last data filed at 05/05/11 1610  Gross per 24 hour  Intake 1816.25 ml  Output   1375 ml  Net 441.25 ml    Weight change:   General: well-nourished in no acute distress. Eyes: no scleral icterus. ENT: There were no oropharyngeal lesions. There was radiation-induced changes with erythema and white fibrosis; but no active thrush. Neck was without thyromegaly. Skin of the neck was slightly red but no opened wound or active discharge or pain on palpation. Lymphatics: Positive for a 2x2cm right cervical node adenopathy. Respiratory: lungs were clear bilaterally without wheezing or crackles. Cardiovascular: Regular rate and rhythm, S1/S2, without murmur, rub or gallop. There was no pedal edema. GI: abdomen was soft, flat, nontender, nondistended, without organomegaly. PEG tube in place without erythema, purulent discharge, pain on palpation. Muscoloskeletal: no spinal tenderness of palpation of vertebral spine. Skin exam was without echymosis, petichae. Neuro exam was nonfocal. Patient was alerted and oriented. Attention was good. Language was appropriate. Mood was normal without depression. Speech was not pressured. Thought content was not tangential   Lab  Results: No results found for this or any previous visit (from the past 24 hour(s)).   Micro: No results found for this or any previous visit (from the past 240 hour(s)).  Studies/Results: Dg Abd 1 View  05/03/2011  *RADIOLOGY REPORT*  Clinical Data: Evaluate for ileus  ABDOMEN - 1 VIEW  Comparison: 10/25/2011  Findings: Within the left upper quadrant of the abdomen and there is a gastrostomy tube.  The bowel gas pattern appears nonobstructed.  No dilated loops of small bowel or fluid levels noted.  Some colonic gas is noted.  Prior lower lumbar spine laminectomy and fusion noted.  IMPRESSION:  1.  Nonobstructive bowel gas pattern.  Original Report Authenticated By: Rosealee Albee, M.D.    Medications:  Scheduled Meds:   . dexamethasone  4 mg Intravenous Q12H  . enoxaparin (LOVENOX) injection  40 mg Subcutaneous Q24H  . feeding supplement  237 mL Oral 6 X Daily  . feeding supplement  30 mL Per Tube Daily  . fentaNYL  25 mcg Transdermal Q72H  . fluconazole (DIFLUCAN) IV  400 mg Intravenous Q24H  . guaifenesin  200 mg Oral Q6H  . hydrogen peroxide   Topical QID  . pantoprazole (PROTONIX) IV  40 mg Intravenous Q12H  . sucralfate  1 g Oral TID WC & HS  . DISCONTD: dexamethasone  2 mg Intravenous Q6H   Continuous Infusions:   . sodium chloride 1,000 mL (05/04/11 2004)  . 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 Candida esophagitis? Start IV Decadron, Ativan if no relief the Decadron and Zofran , continues to have difficulty swallowing, I spoke to Dr. Melvia Heaps yesterday from GI, the plan is to try Carafate slurry, fentanyl patch, if no improvement consider EGD and GI consultation . switch back to outpatient antiemetics again with Zofran/Compazine/Ativan PO prn and Phenergan PR prn. He is still on day 4 out of 5 of Dexamathasone IV for delayed nausea/vomiting.    2. Mucositis/xerostomia: Continue salt/baking soda rinses and alternate with diluted peroxide.  4. Protein calorie malnutrition: continue PEG tube feed. Nutrition consult obtained . 5. Mucositis pain control: on elixir pain meds as needed for moderate and IV pain med for severe pain prn.  6. Code status: full code     LOS: 4 days   [facility] 05/05/2011, 9:13 AM

## 2011-05-06 ENCOUNTER — Encounter: Payer: Self-pay | Admitting: Nutrition

## 2011-05-06 ENCOUNTER — Ambulatory Visit: Payer: BC Managed Care – PPO

## 2011-05-06 ENCOUNTER — Ambulatory Visit
Admission: RE | Admit: 2011-05-06 | Discharge: 2011-05-06 | Disposition: A | Discharge status: Home / Self Care | Payer: BC Managed Care – PPO | Source: Ambulatory Visit | Attending: Radiation Oncology | Admitting: Radiation Oncology

## 2011-05-06 DIAGNOSIS — R112 Nausea with vomiting, unspecified: Secondary | ICD-10-CM

## 2011-05-06 DIAGNOSIS — C109 Malignant neoplasm of oropharynx, unspecified: Secondary | ICD-10-CM

## 2011-05-06 MED ORDER — BIOTENE DRY MOUTH MT LIQD
15.0000 mL | Freq: Two times a day (BID) | OROMUCOSAL | Status: DC
  Administered 2011-05-06 – 2011-05-07 (×3): 15 mL via OROMUCOSAL

## 2011-05-06 NOTE — Progress Notes (Signed)
[facility] Radiation Oncology Dept Therapy Treatment Record Phone [phone]   Radiation Therapy was administered to Kyle Hendrix on: 05/06/2011  2:52 PM and was treatment # 22 out of a planned course of 33 treatments.

## 2011-05-06 NOTE — Progress Notes (Signed)
Subjective: Patient states his appetite has been improved today. He is concerned about a sense of boredom at home which causes him to sleep he on the time of his medications. We had a long chat and there may be a necessity for one or 2 home nursing visits to help the patient to negotiate his care on outpatient basis. Objective: Filed Vitals:   05/06/11 0630 05/06/11 1007 05/06/11 1407 05/06/11 1750  BP: 128/82 114/70 130/79 123/78  Pulse: 96 78 59 82  Temp: 97.8 F (36.6 C) 98.3 F (36.8 C) 98 F (36.7 C) 98 F (36.7 C)  TempSrc: Oral Oral Oral Oral  Resp: 18 18 18 17   Height:      Weight:      SpO2: 97% 97% 98% 100%   Weight change:   Intake/Output Summary (Last 24 hours) at 05/06/11 1819 Last data filed at 05/06/11 1700  Gross per 24 hour  Intake   2515 ml  Output   3325 ml  Net   -810 ml    General: Alert, awake, oriented x3, in no acute distress. He is nontoxic-appearing. HEENT: [place]/AT PEERL, EOMI Neck: Trachea midline,  no masses, no thyromegal,y no JVD, no carotid bruit OROPHARYNX:  Moist, No exudate/ erythema/lesions.  Heart: Regular rate and rhythm, without murmurs, rubs, gallops, PMI non-displaced, no heaves or thrills on palpation.  Lungs: Clear to auscultation, no wheezing or rhonchi noted. No increased vocal fremitus resonant to percussion  Abdomen: Soft, nontender, nondistended, positive bowel sounds, no masses no hepatosplenomegaly noted..  Neuro: No focal neurological deficits noted cranial nerves II through XII grossly intact. Strength functional in bilateral upper and lower extremities. Musculoskeletal: No warm swelling or erythema around joints, no spinal tenderness noted. Psychiatric: Patient alert and oriented x3, good insight and cognition, good recent to remote recall.   Lab Results: No results found for this basename: NA:2,K:2,CL:2,CO2:2,GLUCOSE:2,BUN:2,CREATININE:2,CALCIUM:2,MG:2,PHOS:2 in the last 72 hours No results found for this basename:  AST:2,ALT:2,ALKPHOS:2,BILITOT:2,PROT:2,ALBUMIN:2 in the last 72 hours No results found for this basename: LIPASE:2,AMYLASE:2 in the last 72 hours No results found for this basename: WBC:2,NEUTROABS:2,HGB:2,HCT:2,MCV:2,PLT:2 in the last 72 hours No results found for this basename: CKTOTAL:3,CKMB:3,CKMBINDEX:3,TROPONINI:3 in the last 72 hours No components found with this basename: POCBNP:3 No results found for this basename: DDIMER:2 in the last 72 hours No results found for this basename: HGBA1C:2 in the last 72 hours No results found for this basename: CHOL:2,HDL:2,LDLCALC:2,TRIG:2,CHOLHDL:2,LDLDIRECT:2 in the last 72 hours No results found for this basename: TSH,T4TOTAL,FREET3,T3FREE,THYROIDAB in the last 72 hours No results found for this basename: VITAMINB12:2,FOLATE:2,FERRITIN:2,TIBC:2,IRON:2,RETICCTPCT:2 in the last 72 hours  Micro Results: No results found for this or any previous visit (from the past 240 hour(s)).  Studies/Results: Dg Chest 2 View  04/24/2011  *RADIOLOGY REPORT*  Clinical Data: Nausea, emesis, and diarrhea  CHEST - 2 VIEW  Comparison: None.  Findings: The heart, mediastinal, and hilar contours are normal. The lungs are well-expanded and clear. Negative for pleural effusion. The bony thorax is unremarkable.  IMPRESSION: No acute cardiopulmonary disease.  Original Report Authenticated By: Britta Mccreedy, M.D.   Dg Abd 1 View  05/03/2011  *RADIOLOGY REPORT*  Clinical Data: Evaluate for ileus  ABDOMEN - 1 VIEW  Comparison: 10/25/2011  Findings: Within the left upper quadrant of the abdomen and there is a gastrostomy tube.  The bowel gas pattern appears nonobstructed.  No dilated loops of small bowel or fluid levels noted.  Some colonic gas is noted.  Prior lower lumbar spine laminectomy and fusion noted.  IMPRESSION:  1.  Nonobstructive bowel gas pattern.  Original Report Authenticated By: Rosealee Albee, M.D.    Medications: I have reviewed the patient's current  medications. Scheduled Meds:   . antiseptic oral rinse  15 mL Mouth Rinse BID  . dexamethasone  4 mg Intravenous Q12H  . enoxaparin (LOVENOX) injection  40 mg Subcutaneous Q24H  . feeding supplement  237 mL Oral 6 X Daily  . feeding supplement  30 mL Per Tube Daily  . fentaNYL  25 mcg Transdermal Q72H  . fluconazole (DIFLUCAN) IV  100 mg Intravenous Q24H  . guaifenesin  200 mg Oral Q6H  . hydrogen peroxide   Topical QID  . pantoprazole (PROTONIX) IV  40 mg Intravenous Q12H  . sucralfate  1 g Oral TID WC & HS   Continuous Infusions:   . sodium chloride 1,000 mL (05/06/11 1700)   PRN Meds:.acetaminophen, acetaminophen, alum & mag hydroxide-simeth, guaiFENesin, HYDROcodone-acetaminophen, HYDROmorphone (DILAUDID) injection, LORazepam, LORazepam, magic mouthwash w/lidocaine, menthol-cetylpyridinium, ondansetron (ZOFRAN) IV, ondansetron, oxyCODONE, prochlorperazine, promethazine, zolpidem Assessment/Plan: Patient Active Hospital Problem List: Nausea & vomiting (05/02/2011)   Assessment: Improved patient tolerating small amounts of fluid however still not having significant enough oral intake.    Plan: We'll continue to offer oral intake and monitor her for the next 24 hours.   Right tonsillar squamous cell carcinoma (02/05/2011)   Assessment: Patient had vomiting and dehydration after his last round of chemotherapy. He states that he has discuss with the oncologist and will likely not proceed with a final cycle of chemotherapy. The patient is currently with the radiation therapy and today received a therapy session #22/33    Asthma (05/02/2011)   Assessment: Quiescent     Disposition: Patient will likely be discharged home in the next 24-48 hours if he continues to do well.   LOS: 5 days

## 2011-05-07 ENCOUNTER — Encounter: Payer: Self-pay | Admitting: Oncology

## 2011-05-07 ENCOUNTER — Ambulatory Visit
Admission: RE | Admit: 2011-05-07 | Discharge: 2011-05-07 | Disposition: A | Discharge status: Home / Self Care | Payer: BC Managed Care – PPO | Source: Ambulatory Visit | Attending: Radiation Oncology | Admitting: Radiation Oncology

## 2011-05-07 VITALS — BP 139/90 | HR 82 | Temp 97.9°F | Resp 20 | Wt 205.9 lb

## 2011-05-07 DIAGNOSIS — C109 Malignant neoplasm of oropharynx, unspecified: Secondary | ICD-10-CM

## 2011-05-07 DIAGNOSIS — K5900 Constipation, unspecified: Secondary | ICD-10-CM

## 2011-05-07 DIAGNOSIS — E860 Dehydration: Secondary | ICD-10-CM

## 2011-05-07 DIAGNOSIS — C099 Malignant neoplasm of tonsil, unspecified: Secondary | ICD-10-CM

## 2011-05-07 DIAGNOSIS — R112 Nausea with vomiting, unspecified: Secondary | ICD-10-CM

## 2011-05-07 MED ORDER — FLUCONAZOLE 100 MG PO TABS: 100.0000 mg | ORAL_TABLET | Freq: Every day | ORAL | Status: DC

## 2011-05-07 MED ORDER — POLYETHYLENE GLYCOL 3350 17 G PO PACK: 17.0000 g | PACK | Freq: Every day | ORAL | Status: DC

## 2011-05-07 MED ORDER — POLYETHYLENE GLYCOL 3350 17 G PO PACK
17.0000 g | PACK | Freq: Every day | ORAL | Status: DC
  Filled 2011-05-07: qty 1

## 2011-05-07 MED ORDER — SUCRALFATE 1 GM/10ML PO SUSP: 1.0000 g | Freq: Three times a day (TID) | ORAL | Status: DC

## 2011-05-07 MED ORDER — ENSURE COMPLETE PO LIQD: 237.0000 mL | Freq: Every day | ORAL | Status: DC

## 2011-05-07 NOTE — Progress Notes (Signed)
Department of Radiation Oncology  Phone:  [phone] Fax:        [phone]  Weekly Treatment Note    Name: Kyle Hendrix Date: 05/07/2011 MRN: [id number] DOB: 06/07/1965   Current dose: 48.8 gray  Current fraction: 23   MEDICATIONS: No current facility-administered medications for this encounter.   Current Outpatient Prescriptions  Medication Sig Dispense Refill  . feeding supplement (ENSURE COMPLETE) LIQD Take 237 mLs by mouth 6 (six) times daily.      . fluconazole (DIFLUCAN) 100 MG tablet Take 1 tablet (100 mg total) by mouth daily.  15 tablet  0   Facility-Administered Medications Ordered in Other Encounters  Medication Dose Route Frequency Provider Last Rate Last Dose  . acetaminophen (TYLENOL) tablet 650 mg  650 mg Oral Q6H PRN Ron Parker, MD       Or  . acetaminophen (TYLENOL) suppository 650 mg  650 mg Rectal Q6H PRN Ron Parker, MD      . alum & mag hydroxide-simeth (MAALOX/MYLANTA) 200-200-20 MG/5ML suspension 30 mL  30 mL Oral Q6H PRN Ron Parker, MD      . antiseptic oral rinse (BIOTENE) solution 15 mL  15 mL Mouth Rinse BID Altha Harm, MD   15 mL at 05/07/11 0950  . dexamethasone (DECADRON) injection 4 mg  4 mg Intravenous Q12H Exie Parody, MD   4 mg at 05/07/11 0950  . enoxaparin (LOVENOX) injection 40 mg  40 mg Subcutaneous Q24H Richarda Overlie, MD   40 mg at 05/07/11 1254  . feeding supplement (ENSURE COMPLETE) liquid 237 mL  237 mL Oral 6 X Daily Richarda Overlie, MD   237 mL at 05/07/11 1251  . feeding supplement (PRO-STAT SUGAR FREE 64) liquid 30 mL  30 mL Per Tube Daily Elyse A Shearer, RD   30 mL at 05/07/11 0950  . fentaNYL (DURAGESIC - dosed mcg/hr) patch 25 mcg  25 mcg Transdermal Q72H Richarda Overlie, MD   25 mcg at 05/06/11 1700  . fluconazole (DIFLUCAN) IVPB 100 mg  100 mg Intravenous Q24H Richarda Overlie, MD   100 mg at 05/07/11 1125  . guaiFENesin (ROBITUSSIN) 100 MG/5ML solution 300 mg  15 mL Per Tube Q6H PRN Richarda Overlie,  MD   300 mg at 05/06/11 1216  . guaifenesin (ROBITUSSIN) 100 MG/5ML syrup 200 mg  200 mg Oral Q6H Richarda Overlie, MD   200 mg at 05/07/11 1250  . HYDROcodone-acetaminophen (LORTAB) 7.5-500 MG/15ML solution 15 mL  15 mL Per Tube Q4H PRN Richarda Overlie, MD   15 mL at 05/07/11 1127  . hydrogen peroxide 3 % external solution   Topical QID Exie Parody, MD      . HYDROmorphone (DILAUDID) injection 0.5 mg  0.5 mg Intravenous Q4H PRN Everett Graff, NP   0.5 mg at 05/02/11 0415  . LORazepam (ATIVAN) injection 1 mg  1 mg Intravenous Q6H PRN Richarda Overlie, MD   1 mg at 05/06/11 2221  . LORazepam (ATIVAN) tablet 0.5 mg  0.5 mg Oral Q6H PRN Exie Parody, MD      . magic mouthwash w/lidocaine  10 mL Oral QID PRN Richarda Overlie, MD   10 mL at 05/07/11 1253  . menthol-cetylpyridinium (CEPACOL) lozenge 3 mg  1 lozenge Oral PRN Richarda Overlie, MD   3 mg at 05/06/11 1217  . ondansetron (ZOFRAN) tablet 4 mg  4 mg Oral Q6H PRN Ron Parker, MD       Or  .  ondansetron (ZOFRAN) injection 4 mg  4 mg Intravenous Q6H PRN Ron Parker, MD   4 mg at 05/06/11 2221  . oxyCODONE (Oxy IR/ROXICODONE) immediate release tablet 5 mg  5 mg Oral Q4H PRN Ron Parker, MD      . pantoprazole (PROTONIX) injection 40 mg  40 mg Intravenous Q12H Richarda Overlie, MD   40 mg at 05/07/11 0950  . prochlorperazine (COMPAZINE) tablet 10 mg  10 mg Oral Q6H PRN Exie Parody, MD      . promethazine (PHENERGAN) suppository 25 mg  25 mg Rectal Q6H PRN Exie Parody, MD      . sucralfate (CARAFATE) 1 GM/10ML suspension 1 g  1 g Oral TID WC & HS Richarda Overlie, MD      . zolpidem (AMBIEN) tablet 5 mg  5 mg Oral QHS PRN Ron Parker, MD      . DISCONTD: 0.9 %  sodium chloride infusion  1,000 mL Intravenous Continuous Richarda Overlie, MD 75 mL/hr at 05/06/11 2347 1,000 mL at 05/06/11 2347     ALLERGIES: Contrast media; Iodine; Shellfish allergy; Betadine; Avelox; Morphine and related; Prednisone; and Ultram   LABORATORY DATA:  Lab Results  Component  Value Date   WBC 4.1 05/03/2011   HGB 11.4* 05/03/2011   HCT 32.3* 05/03/2011   MCV 86.4 05/03/2011   PLT 200 05/03/2011   Lab Results  Component Value Date   NA 137 05/02/2011   K 3.5 05/02/2011   CL 100 05/02/2011   CO2 30 05/02/2011   Lab Results  Component Value Date   ALT 40 05/01/2011   AST 32 05/01/2011   ALKPHOS 67 05/01/2011   BILITOT 0.6 05/01/2011     NARRATIVE: Kyle Hendrix was seen today for weekly treatment management. The chart was checked and the patient's films were reviewed.  the patient is doing better today. He is eating soft foods and he states that his pain is under control. He is using a Duragesic patch as well as Lortab elixir when necessary. The patient has also been on Diflucan. The patient states that he feels healthy her overall. He is using Biafine on an infrequent basis. No nausea or vomiting for the last 2 days.  PHYSICAL EXAMINATION: weight is 205 lb 14.4 oz (93.396 kg). His oral temperature is 97.9 F (36.6 C). His blood pressure is 139/90 and his pulse is 82. His respiration is 20.      patient's skin looks good. No desquamation. Some thickened saliva and mucositis present as expected in the oral cavity. No substantial change in this  ASSESSMENT: The patient is doing satisfactorily with treatment.  PLAN: We will continue with the patient's radiation treatment as planned.  patient is going to be discharged soon. I'm pleased with how he is doing better and we will continue his radiation.

## 2011-05-07 NOTE — Progress Notes (Signed)
[facility] Radiation Oncology Dept Therapy Treatment Record Phone [phone]   Radiation Therapy was administered to Kyle Hendrix on: 05/07/2011  1:17 PM and was treatment # 23 out of a planned course of 33 treatments.

## 2011-05-07 NOTE — Progress Notes (Signed)
Patient received two prescriptions from [place] [facility] on 05/01/11 $13.95,his remaninig balance [facility] $189.19.

## 2011-05-07 NOTE — Progress Notes (Signed)
Pt still in hospital. States no n/v x 2 days, eating soft foods, Ensure 6 cans daily per peg tube. Pt states throat pain very manageable w/Duragesic patch and Lortab elixir prn. Pt on Diflucan as well. Pt states he "feels healthier". Biafine to skin in neck tx area. Pt may be discharged tomorrow.

## 2011-05-07 NOTE — Discharge Summary (Signed)
Kyle Hendrix MRN: [id number] DOB/AGE: September 08, 1965 46 y.o.  Admit date: 05/01/2011 Discharge date: 05/07/2011  Primary Care Physician:  Pamelia Hoit, MD, MD   Discharge Diagnoses:   Patient Active Problem List  Diagnoses  . Sleep apnea  . 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 Allergic state  . Asthma  . Right tonsillar squamous cell carcinoma  . Nausea & vomiting  . Asthma    DISCHARGE MEDICATION: Medication List  As of 05/07/2011  2:15 PM   STOP taking these medications         HYDROcodone-acetaminophen 7.5-500 MG/15ML solution      ondansetron 8 MG disintegrating tablet         TAKE these medications         dexamethasone 4 MG tablet   Commonly known as: DECADRON   Take 1 tablet (4 mg total) by mouth 2 (two) times daily with a meal.      Diphenhyd-Hydrocort-Nystatin Susp   Use as directed 15 mLs in the mouth or throat 4 (four) times daily as needed (Swish, gargle and spit as needed for mouth sores).      feeding supplement Liqd   Take 237 mLs by mouth 6 (six) times daily.      fentaNYL 25 MCG/HR   Commonly known as: DURAGESIC - dosed mcg/hr   Place 1 patch (25 mcg total) onto the skin every 3 (three) days.      fluconazole 100 MG tablet   Commonly known as: DIFLUCAN   Take 1 tablet (100 mg total) by mouth daily.      IMODIUM PO   Take by mouth. Take 2 tablets after first loose stool, then 1 tablets after each additional loose stool. 8 tablets daily, maximum.      lisdexamfetamine 70 MG capsule   Commonly known as: VYVANSE   Take 70 mg by mouth every morning.      LORazepam 0.5 MG tablet   Commonly known as: ATIVAN   Take 1 tablet (0.5 mg total) by mouth every 6 (six) hours as needed (Nausea or vomiting).      meloxicam 15 MG tablet   Commonly known as: MOBIC   Take 15 mg by mouth daily as needed.      nystatin 100000 UNIT/ML suspension   Commonly known as: MYCOSTATIN      omeprazole 20 MG capsule   Commonly  known as: PRILOSEC   Take 1 capsule (20 mg total) by mouth daily.      prochlorperazine 10 MG tablet   Commonly known as: COMPAZINE   Take 10 mg by mouth every 6 (six) hours as needed.      promethazine 25 MG suppository   Commonly known as: PHENERGAN   Place 1 suppository (25 mg total) rectally 2 (two) times daily as needed for nausea.      sertraline 100 MG tablet    Commonly known as Zoloft   Take 100 mg by mouth at bedtime.    miralax  17 G packet    Take 17 G in 8 oz of fluid daily for constipation              Consults:     SIGNIFICANT DIAGNOSTIC STUDIES:  Dg Chest 2 View  04/24/2011  *RADIOLOGY REPORT*  Clinical Data: Nausea, emesis, and diarrhea  CHEST - 2 VIEW  Comparison: None.  Findings: The heart, mediastinal, and hilar contours are normal. The lungs are well-expanded and clear. Negative for  pleural effusion. The bony thorax is unremarkable.  IMPRESSION: No acute cardiopulmonary disease.  Original Report Authenticated By: Britta Mccreedy, M.D.   Dg Abd 1 View  05/03/2011  *RADIOLOGY REPORT*  Clinical Data: Evaluate for ileus  ABDOMEN - 1 VIEW  Comparison: 10/25/2011  Findings: Within the left upper quadrant of the abdomen and there is a gastrostomy tube.  The bowel gas pattern appears nonobstructed.  No dilated loops of small bowel or fluid levels noted.  Some colonic gas is noted.  Prior lower lumbar spine laminectomy and fusion noted.  IMPRESSION:  1.  Nonobstructive bowel gas pattern.  Original Report Authenticated By: Rosealee Albee, M.D.       BRIEF ADMITTING H & P: Kyle Hendrix is an 46 y.o. male with Right Tonsillar SCCa diagnosed in 02/2011 currently undergoing chemotherapy and radiation therapy who presents with complaints of severe intractable nausea and vomiting since his last chemotherapy treatment 3 days ago. He has not been able to hold down foods or liquids, and was seen in the ED 2 days ago , and treated with IV fluids and anti-emetics, but at  home his symptoms continued. He had his radiation treatment in the AM And was again treated at the cancer center with IVFs and antiemetics but his symptoms persisted so he was advised to report to the ED for evaluation and further treatment.     Hospital Course:  Present on Admission:  .Nausea & vomiting: Felt to be secondary to side effects of chemo and possibly esophageal candidiasis. Pt was given supportive therapy and started on Carafate slurry and diflucan for presumptive treatment of esophageal candidiasis. Pt did not want to take Carafate, however after starting treatment for candidiasis, pt has been tolerating food without any emesis. I will continue diflucan for a total of 14 days.  .Right tonsillar squamous cell carcinoma: Pt has continued XRT under the care of Dr. Mitzi Hansen. He will follow up as previously arranged with Dr. Gaylyn Rong re: further chemotherapy.  .Constipation: Pt has not had a bowel movement in 3 days. I have prescribed Miralax for him as he is hesitant to use anything stronger. He has had no nausea or emesis as a result of his constipation, and continues to tolerate oral intake well. Pt should continue Miralax at home as needed.   Disposition and Follow-up:  Pt to follow up with Dr. Gaylyn Rong as previously arranged and will continue with radiation therapy as scheduled below.  Discharge Orders    Future Appointments: Provider: Department: Dept Phone: Center:   05/08/2011 8:00 AM [facility] [phone] None   05/08/2011 9:00 AM [facility] [phone] None   05/09/2011 8:00 AM [facility] [phone] None   05/11/2011 8:00 AM [facility] [phone] None   05/11/2011 9:15 AM Beverely Pace Shumate [facility] [phone] None   05/11/2011 9:45 AM Myrtis Ser, NP [facility] [phone] None   05/12/2011 8:00 AM [facility] [phone] None   05/13/2011 8:00 AM [facility] [phone] None   05/14/2011 8:00 AM [facility] [phone] None   05/15/2011 8:00 AM [facility] [phone] None   05/15/2011 8:45 AM Mauri Brooklyn [facility] [phone] None   05/15/2011 9:15 AM [facility] [phone] None   05/15/2011 9:45 AM Anabel Bene, RD [facility] [phone] None   05/16/2011 8:00 AM [facility] [phone] None  05/18/2011 8:00 AM [facility] [phone] None   05/18/2011 12:00 PM Exie Parody, MD [facility] [phone] None   05/19/2011 8:00 AM [facility] [phone] None   05/19/2011 9:00 AM [facility] [phone] None   05/19/2011 9:45 AM Anabel Bene, RD [facility] [phone] None   05/20/2011 8:00 AM [facility] [phone] None   05/21/2011 8:00 AM [facility] [phone] None   05/21/2011 3:15 PM Barron Alvine, CCC-SLP [facility] [phone] [facility]      DISCHARGE EXAM:  General: Alert, awake, oriented x3, in no acute distress. He is nontoxic-appearing.  Vital Signs: Blood pressure 134/83, pulse 88, temperature 97.9 F (36.6 C), temperature source Oral, resp. rate 18, height 5\' 8"  (1.727 m), weight 95.437 kg (210 lb 6.4 oz), SpO2 96.00%. HEENT: [place]/AT PEERL, EOMI  Neck: Trachea midline, no masses, no thyromegal,y no JVD, no carotid bruit  OROPHARYNX: Moist, No exudate/ erythema/lesions.  Heart: Regular rate and rhythm, without murmurs, rubs, gallops, PMI non-displaced, no heaves or thrills on palpation.  Lungs: Clear to auscultation, no wheezing or rhonchi noted. No increased vocal fremitus resonant to percussion  Abdomen: Soft, nontender, nondistended, positive bowel sounds, no masses no hepatosplenomegaly noted..  Neuro: No focal neurological deficits noted cranial nerves II through XII  grossly intact. Strength functional in bilateral upper and lower extremities.  Musculoskeletal: No warm swelling or erythema around joints, no spinal tenderness noted.  Psychiatric: Patient alert and oriented x3, good insight and cognition, good recent to remote recall.  Total time for D/C process including face to face time approximately 35 minutes.  Signed: Clemon Devaul A. 05/07/2011, 2:15 PM

## 2011-05-08 ENCOUNTER — Ambulatory Visit
Admission: RE | Admit: 2011-05-08 | Discharge: 2011-05-08 | Disposition: A | Discharge status: Home / Self Care | Payer: BC Managed Care – PPO | Source: Ambulatory Visit | Attending: Radiation Oncology | Admitting: Radiation Oncology

## 2011-05-08 ENCOUNTER — Ambulatory Visit: Payer: Self-pay

## 2011-05-08 ENCOUNTER — Ambulatory Visit (HOSPITAL_BASED_OUTPATIENT_CLINIC_OR_DEPARTMENT_OTHER): Payer: BC Managed Care – PPO

## 2011-05-08 ENCOUNTER — Other Ambulatory Visit: Payer: Self-pay

## 2011-05-08 VITALS — BP 100/64 | HR 76 | Temp 98.2°F

## 2011-05-08 DIAGNOSIS — C801 Malignant (primary) neoplasm, unspecified: Secondary | ICD-10-CM

## 2011-05-08 DIAGNOSIS — C099 Malignant neoplasm of tonsil, unspecified: Secondary | ICD-10-CM

## 2011-05-08 MED ORDER — LORAZEPAM 0.5 MG PO TABS: 0.5000 mg | ORAL_TABLET | Freq: Four times a day (QID) | ORAL | Status: DC | PRN

## 2011-05-08 MED ORDER — SODIUM CHLORIDE 0.9 % IV SOLN
INTRAVENOUS | Status: DC
  Administered 2011-05-08: 09:00:00 via INTRAVENOUS

## 2011-05-09 ENCOUNTER — Ambulatory Visit: Payer: Self-pay

## 2011-05-11 ENCOUNTER — Ambulatory Visit (HOSPITAL_BASED_OUTPATIENT_CLINIC_OR_DEPARTMENT_OTHER): Payer: BC Managed Care – PPO | Admitting: Oncology

## 2011-05-11 ENCOUNTER — Encounter: Payer: Self-pay | Admitting: Oncology

## 2011-05-11 ENCOUNTER — Other Ambulatory Visit (HOSPITAL_BASED_OUTPATIENT_CLINIC_OR_DEPARTMENT_OTHER): Payer: BC Managed Care – PPO | Admitting: Lab

## 2011-05-11 ENCOUNTER — Ambulatory Visit
Admission: RE | Admit: 2011-05-11 | Discharge: 2011-05-11 | Disposition: A | Discharge status: Home / Self Care | Payer: BC Managed Care – PPO | Source: Ambulatory Visit | Attending: Radiation Oncology | Admitting: Radiation Oncology

## 2011-05-11 VITALS — BP 113/75 | HR 70 | Temp 97.5°F | Ht 68.0 in | Wt 201.3 lb

## 2011-05-11 DIAGNOSIS — R112 Nausea with vomiting, unspecified: Secondary | ICD-10-CM

## 2011-05-11 DIAGNOSIS — B977 Papillomavirus as the cause of diseases classified elsewhere: Secondary | ICD-10-CM

## 2011-05-11 DIAGNOSIS — C099 Malignant neoplasm of tonsil, unspecified: Secondary | ICD-10-CM

## 2011-05-11 DIAGNOSIS — K1230 Oral mucositis (ulcerative), unspecified: Secondary | ICD-10-CM

## 2011-05-11 LAB — CBC WITH DIFFERENTIAL/PLATELET
Basophils Absolute: 0 10*3/uL (ref 0.0–0.1)
EOS%: 0.1 % (ref 0.0–7.0)
HGB: 11.7 g/dL — ABNORMAL LOW (ref 13.0–17.1)
MCH: 30.9 pg (ref 27.2–33.4)
MONO#: 0.2 10*3/uL (ref 0.1–0.9)
NEUT#: 3.7 10*3/uL (ref 1.5–6.5)
RDW: 13.6 % (ref 11.0–14.6)
WBC: 4 10*3/uL (ref 4.0–10.3)
lymph#: 0.1 10*3/uL — ABNORMAL LOW (ref 0.9–3.3)

## 2011-05-11 LAB — BASIC METABOLIC PANEL
Glucose, Bld: 143 mg/dL — ABNORMAL HIGH (ref 70–99)
Potassium: 4.6 mEq/L (ref 3.5–5.3)
Sodium: 136 mEq/L (ref 135–145)

## 2011-05-11 NOTE — Progress Notes (Signed)
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   CURRENT THERAPY: Concurrent chemoradiation with q3wk Cisplatin 100mg /m2 and daily radiation. Cisplatin was started on 04/07/11.  INTERVAL HISTORY: Kyle Hendrix 46 y.o. male returns for regular follow up.  He was hospitalized last week for intractable nausea and vomiting. He was given IV antiemetics and treated for presumptive esophageal candidiasis with diflucan. He remains on the Diflucan at this time. Since hospital discharge, he feels stronger. He was able to go to his child's soccer game this past weekend. He is eating PO well; able to eat soft food like pasta. He continues to put about 6 cans of Ensure per day through his PEG tube. Weight is still down, but the patient is working hard to maintain his nutritional status. No nausea or vomiting since hospital discharge. He denies PEG tube pain, discharge, erythema. On a Fentanyl patch and requiring Lortab elixir only 1-2 times per day. He has xerostomia and thick phlegm. He is using Robitussin for the thick phlegm. He continues to use salt/baking soda rinses. He has mild fatigue; however, he is independent of all activities of daily living. He has tinnitus that he thinks is worsening, but no hearing loss.   Patient denies headache, visual changes, confusion, drenching night sweats, hearing loss, palpable lymph node swelling, mucositis, odynophagia, dysphagia, nausea vomiting, jaundice, chest pain, palpitation, shortness of breath, dyspnea on exertion, productive cough, gum bleeding, epistaxis, hematemesis, hemoptysis, abdominal pain, abdominal swelling, early satiety, melena, hematochezia, hematuria, skin rash, spontaneous bleeding, joint swelling, joint pain, heat or cold  intolerance, bowel bladder incontinence, back pain, focal motor weakness, paresthesia, depression, suicidal or homocidal ideation, feeling hopelessness.   Past Medical History  Diagnosis Date  . Nasal polyps   . 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 Depression   . Fatigue   . Degeneration of lumbar intervertebral disc   . Allergy     seasonal  . Asthma     seasonal  . 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 TMJ (dislocation of temporomandibular joint)   . Sleep apnea     Past Surgical History  Procedure Date  . Anterior release vertebral body w/ posterior fusion     lumbar  . Back surgery 09/1999    L4-5 fusion  . Multiple tooth extractions March 2013    6 teeth removed  . Peg placement March 2013    Placed at [place] [facility]  . Panendoscopy 03/18/2011    Procedure: PANENDOSCOPY;  Surgeon: Flo Shanks, MD;  Location: [facility] OR;  Service: ENT;  Laterality: N/A;  PANDENDONOSCOPY  . Tonsillectomy 03/18/2011    Procedure: TONSILLECTOMY;  Surgeon: Flo Shanks, MD;  Location: [facility] OR;  Service: ENT;  Laterality: Right;    Current Outpatient Prescriptions  Medication Sig Dispense Refill  . dexamethasone (DECADRON) 4 MG tablet Take 1 tablet (4 mg total) by mouth 2 (two) times daily with a meal.  10 tablet  0  . Diphenhyd-Hydrocort-Nystatin SUSP Use as directed 15 mLs in the mouth or throat 4 (four) times daily as needed (Swish, gargle and spit as needed for mouth sores).  500 mL  3  . feeding supplement (ENSURE COMPLETE) LIQD Take 237 mLs by mouth 6 (six) times daily.      Marland Kitchen  fentaNYL (DURAGESIC - DOSED MCG/HR) 25 MCG/HR Place 1 patch (25 mcg total) onto the skin every 3 (three) days.  10 patch  0  . fluconazole (DIFLUCAN) 100 MG tablet Take 1 tablet (100 mg total) by mouth daily.  15 tablet  0  . HYDROcodone-acetaminophen (LORTAB) 7.5-500 MG/15ML solution 15 mLs Every 6 hours as needed.      . Loperamide HCl (IMODIUM PO) Take by mouth. Take 2  tablets after first loose stool, then 1 tablets after each additional loose stool. 8 tablets daily, maximum.      Marland Kitchen LORazepam (ATIVAN) 0.5 MG tablet Take 1 tablet (0.5 mg total) by mouth every 6 (six) hours as needed (Nausea or vomiting).  30 tablet  0  . meloxicam (MOBIC) 15 MG tablet Take 15 mg by mouth daily as needed.       . nystatin (MYCOSTATIN) 100000 UNIT/ML suspension       . omeprazole (PRILOSEC) 20 MG capsule Take 1 capsule (20 mg total) by mouth daily.  30 capsule  2  . polyethylene glycol (MIRALAX / GLYCOLAX) packet Take 17 g by mouth daily.  14 each    . prochlorperazine (COMPAZINE) 10 MG tablet Take 10 mg by mouth every 6 (six) hours as needed.      . sertraline (ZOLOFT) 100 MG tablet Take 100 mg by mouth at bedtime.       Marland Kitchen lisdexamfetamine (VYVANSE) 70 MG capsule Take 70 mg by mouth every morning.      . ondansetron (ZOFRAN-ODT) 8 MG disintegrating tablet 8 mg Every 8 hours as needed.      . promethazine (PHENERGAN) 25 MG suppository Place 1 suppository (25 mg total) rectally 2 (two) times daily as needed for nausea.  24 each  0    ALLERGIES:  is allergic to contrast media; iodine; shellfish allergy; betadine; avelox; morphine and related; prednisone; and ultram.  REVIEW OF SYSTEMS:  The rest of the 14-point review of system was negative.   Filed Vitals:   05/11/11 0939  BP: 113/75  Pulse: 70  Temp: 97.5 F (36.4 C)   Wt Readings from Last 3 Encounters:  05/11/11 201 lb 4.8 oz (91.309 kg)  05/02/11 210 lb 6.4 oz (95.437 kg)  05/07/11 205 lb 14.4 oz (93.396 kg)   ECOG Performance status: 0  PHYSICAL EXAMINATION:   General:  well-nourished in no acute distress.  Eyes:  no scleral icterus.  ENT:  There were no oropharyngeal lesions.  There was radiation-induced changes with erythema and white fibrosis; but no active thrush.  Neck was without thyromegaly.  Skin of the neck was slightly red but no opened wound or active discharge or pain on palpation.  Lymphatics:   Negative cervical, supraclavicular or axillary adenopathy.  Respiratory: lungs were clear bilaterally without wheezing or crackles.  Cardiovascular:  Regular rate and rhythm, S1/S2, without murmur, rub or gallop.  There was no pedal edema.  GI:  abdomen was soft, flat, nontender, nondistended, without organomegaly.  PEG tube in place without erythema, purulent discharge, pain on palpation.  Muscoloskeletal:  no spinal tenderness of palpation of vertebral spine.  Skin exam was without echymosis, petichae.  Neuro exam was nonfocal.  Patient was able to get on and off exam table without assistance.  Gait was normal.  Patient was alerted and oriented.  Attention was good.   Language was appropriate.  Mood was normal without depression.  Speech was not pressured.  Thought content was not tangential.    LABORATORY/RADIOLOGY DATA:  Lab Results  Component Value Date   WBC 4.0 05/11/2011   HGB 11.7* 05/11/2011   HCT 34.2* 05/11/2011   PLT 131* 05/11/2011   GLUCOSE 100* 05/02/2011   ALKPHOS 67 05/01/2011   ALT 40 05/01/2011   AST 32 05/01/2011   NA 137 05/02/2011   K 3.5 05/02/2011   CL 100 05/02/2011   CREATININE 0.99 05/02/2011   BUN 15 05/02/2011   CO2 30 05/02/2011    Dg Chest 2 View  04/24/2011  *RADIOLOGY REPORT*  Clinical Data: Nausea, emesis, and diarrhea  CHEST - 2 VIEW  Comparison: None.  Findings: The heart, mediastinal, and hilar contours are normal. The lungs are well-expanded and clear. Negative for pleural effusion. The bony thorax is unremarkable.  IMPRESSION: No acute cardiopulmonary disease.  Original Report Authenticated By: Britta Mccreedy, M.D.    ASSESSMENT AND PLAN:   1. 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 On concurrent q 3 week Cisplatin with XRT. He is now s/p 2 cycles of Cisplatin. He developed grade 3 nausea/vomiting requiring hospitalization for IVF and IV anti-emetics. He is now regaining his strength  and no further nausea or vomiting today. Patient is not sure is he wants to receive his third scheduled cycle of Cisplatin next week. Dr Gaylyn Rong has spoke with the patient and plans to re-evaluate next week. Reviewed options of proceeding with 3rd cycle without dose modification vs 25% dose reduction of Cisplatin vs not giving the dose at all. Decision will be made at his visit next week.  2. Nausea/vomiting: Has Zofran/Ativan/Compazine.  Nausea and vomiting resolved at this time. He is scheduled for IVF on 5/10 and 5/11.  3. Mucositis/xerostomia: Continue salt/baking soda rinses and alternate with diluted peroxide.  Continue Robitussin for thick phlegm.  4. Protein calorie malnutrition: His weight is down, but able to take foods by mouth.  He will continue to eat as much as possible and continue Ensure as he has been doing. Advised him to continue with swallowing exercise to reduce the risk of esophageal stenosis.   5.  Mucositis pain control:  On Fentanyl patch and now taking Lortab 1-2 times per day for breakthrough pain.   6. Anemia: Due to chemotherapy. No active bleeding. No transfusion indicated.   7.  Follow up:  RV in 1 week with Dr Gaylyn Rong.   The length of time of the face-to-face encounter was 15 minutes. More than 50% of time was spent counseling and coordination of care.

## 2011-05-12 ENCOUNTER — Ambulatory Visit
Admission: RE | Admit: 2011-05-12 | Discharge: 2011-05-12 | Disposition: A | Discharge status: Home / Self Care | Payer: BC Managed Care – PPO | Source: Ambulatory Visit | Attending: Radiation Oncology | Admitting: Radiation Oncology

## 2011-05-12 ENCOUNTER — Encounter: Payer: Self-pay | Admitting: Oncology

## 2011-05-12 NOTE — Progress Notes (Signed)
Patient received two prescription from [place] [facility] on 05/08/11 $8.23,his remaninig balance [facility] $180.96.

## 2011-05-13 ENCOUNTER — Ambulatory Visit
Admission: RE | Admit: 2011-05-13 | Discharge: 2011-05-13 | Disposition: A | Discharge status: Home / Self Care | Payer: BC Managed Care – PPO | Source: Ambulatory Visit | Attending: Radiation Oncology | Admitting: Radiation Oncology

## 2011-05-14 ENCOUNTER — Encounter: Payer: Self-pay | Admitting: Radiation Oncology

## 2011-05-14 ENCOUNTER — Ambulatory Visit
Admission: RE | Admit: 2011-05-14 | Discharge: 2011-05-14 | Disposition: A | Discharge status: Home / Self Care | Payer: BC Managed Care – PPO | Source: Ambulatory Visit | Attending: Radiation Oncology | Admitting: Radiation Oncology

## 2011-05-14 VITALS — BP 112/73 | HR 92 | Temp 98.3°F | Resp 20 | Wt 201.2 lb

## 2011-05-14 DIAGNOSIS — C099 Malignant neoplasm of tonsil, unspecified: Secondary | ICD-10-CM

## 2011-05-14 NOTE — Progress Notes (Signed)
Department of Radiation Oncology  Phone:  [phone] Fax:        [phone]  Weekly Treatment Note    Name: Kyle Hendrix Date: 05/14/2011 MRN: [id number] DOB: 09-23-1965   Current dose: 59.4 gray  Current fraction: 28   MEDICATIONS: Current Outpatient Prescriptions  Medication Sig Dispense Refill  . Diphenhyd-Hydrocort-Nystatin SUSP Use as directed 15 mLs in the mouth or throat 4 (four) times daily as needed (Swish, gargle and spit as needed for mouth sores).  500 mL  3  . feeding supplement (ENSURE COMPLETE) LIQD Take 237 mLs by mouth 6 (six) times daily.      . fentaNYL (DURAGESIC - DOSED MCG/HR) 25 MCG/HR Place 1 patch (25 mcg total) onto the skin every 3 (three) days.  10 patch  0  . fluconazole (DIFLUCAN) 100 MG tablet Take 1 tablet (100 mg total) by mouth daily.  15 tablet  0  . HYDROcodone-acetaminophen (LORTAB) 7.5-500 MG/15ML solution 15 mLs Every 6 hours as needed.      Marland Kitchen LORazepam (ATIVAN) 0.5 MG tablet Take 1 tablet (0.5 mg total) by mouth every 6 (six) hours as needed (Nausea or vomiting).  30 tablet  0  . meloxicam (MOBIC) 15 MG tablet Take 15 mg by mouth daily as needed.       . nystatin (MYCOSTATIN) 100000 UNIT/ML suspension       . omeprazole (PRILOSEC) 20 MG capsule Take 1 capsule (20 mg total) by mouth daily.  30 capsule  2  . polyethylene glycol (MIRALAX / GLYCOLAX) packet Take 17 g by mouth daily.  14 each    . prochlorperazine (COMPAZINE) 10 MG tablet Take 10 mg by mouth every 6 (six) hours as needed.      Marland Kitchen lisdexamfetamine (VYVANSE) 70 MG capsule Take 70 mg by mouth every morning.      . Loperamide HCl (IMODIUM PO) Take by mouth. Take 2 tablets after first loose stool, then 1 tablets after each additional loose stool. 8 tablets daily, maximum.      . ondansetron (ZOFRAN-ODT) 8 MG disintegrating tablet 8 mg Every 8 hours as needed.      . promethazine (PHENERGAN) 25 MG suppository Place 1 suppository (25 mg total) rectally 2 (two) times daily as  needed for nausea.  24 each  0  . sertraline (ZOLOFT) 100 MG tablet Take 100 mg by mouth at bedtime.          ALLERGIES: Contrast media; Iodine; Shellfish allergy; Betadine; Avelox; Morphine and related; Prednisone; and Ultram   LABORATORY DATA:  Lab Results  Component Value Date   WBC 4.0 05/11/2011   HGB 11.7* 05/11/2011   HCT 34.2* 05/11/2011   MCV 90.3 05/11/2011   PLT 131* 05/11/2011   Lab Results  Component Value Date   NA 136 05/11/2011   K 4.6 05/11/2011   CL 98 05/11/2011   CO2 27 05/11/2011   Lab Results  Component Value Date   ALT 40 05/01/2011   AST 32 05/01/2011   ALKPHOS 67 05/01/2011   BILITOT 0.6 05/01/2011     NARRATIVE: Kyle Hendrix was seen today for weekly treatment management. The chart was checked and the patient's films were reviewed. The patient has been stable over the last week. No major changes. He states that he is eating 2 meals a day and is taking and 6 cans of Ensure daily through the PEG tube.  PHYSICAL EXAMINATION: weight is 201 lb 3.2 oz (91.264 kg). His oral temperature is  98.3 F (36.8 C). His blood pressure is 112/73 and his pulse is 92. His respiration is 20.      the patient's skin looks good. Fairly mild changes with some increased irritation and a little area of dry desquamation in the right upper neck in the submandibular region. Thickened saliva and some mucositis present, which looked similar to last week. No major changes.  ASSESSMENT: The patient is doing satisfactorily with treatment.  PLAN: We will continue with the patient's radiation treatment as planned. Patient I believe is doing well. His pain is well controlled at this time. I'm pleased with how he is doing and I anticipate that he will finish in one week as scheduled.

## 2011-05-14 NOTE — Progress Notes (Signed)
Patient alert,oriente x3,vital wnl, eating 2 meals daily and 6 cans ensure daily via peg tube, maintaining weight, tongue is painful on right side, and back opening of his mouth, takes lortab elixir and has fentanyl patch, rinces mouth several times daily 8:46 AM

## 2011-05-15 ENCOUNTER — Ambulatory Visit: Payer: BC Managed Care – PPO | Admitting: Nutrition

## 2011-05-15 ENCOUNTER — Ambulatory Visit
Admission: RE | Admit: 2011-05-15 | Discharge: 2011-05-15 | Disposition: A | Discharge status: Home / Self Care | Payer: BC Managed Care – PPO | Source: Ambulatory Visit | Attending: Radiation Oncology | Admitting: Radiation Oncology

## 2011-05-15 ENCOUNTER — Other Ambulatory Visit (HOSPITAL_BASED_OUTPATIENT_CLINIC_OR_DEPARTMENT_OTHER): Payer: BC Managed Care – PPO | Admitting: Lab

## 2011-05-15 ENCOUNTER — Ambulatory Visit (HOSPITAL_BASED_OUTPATIENT_CLINIC_OR_DEPARTMENT_OTHER): Payer: BC Managed Care – PPO

## 2011-05-15 DIAGNOSIS — G4730 Sleep apnea, unspecified: Secondary | ICD-10-CM

## 2011-05-15 DIAGNOSIS — C099 Malignant neoplasm of tonsil, unspecified: Secondary | ICD-10-CM

## 2011-05-15 DIAGNOSIS — K1230 Oral mucositis (ulcerative), unspecified: Secondary | ICD-10-CM

## 2011-05-15 LAB — COMPREHENSIVE METABOLIC PANEL
BUN: 17 mg/dL (ref 6–23)
CO2: 33 mEq/L — ABNORMAL HIGH (ref 19–32)
Calcium: 9.1 mg/dL (ref 8.4–10.5)
Chloride: 94 mEq/L — ABNORMAL LOW (ref 96–112)
Creatinine, Ser: 0.85 mg/dL (ref 0.50–1.35)

## 2011-05-15 LAB — CBC WITH DIFFERENTIAL/PLATELET
Eosinophils Absolute: 0.1 10*3/uL (ref 0.0–0.5)
HCT: 33.9 % — ABNORMAL LOW (ref 38.4–49.9)
LYMPH%: 12.2 % — ABNORMAL LOW (ref 14.0–49.0)
MCHC: 34.5 g/dL (ref 32.0–36.0)
MONO#: 0.2 10*3/uL (ref 0.1–0.9)
NEUT#: 1.4 10*3/uL — ABNORMAL LOW (ref 1.5–6.5)
NEUT%: 71.9 % (ref 39.0–75.0)
Platelets: 139 10*3/uL — ABNORMAL LOW (ref 140–400)
WBC: 1.9 10*3/uL — ABNORMAL LOW (ref 4.0–10.3)

## 2011-05-15 MED ORDER — ONDANSETRON 8 MG/50ML IVPB (CHCC)
8.0000 mg | Freq: Once | INTRAVENOUS | Status: AC
  Administered 2011-05-15: 8 mg via INTRAVENOUS

## 2011-05-15 MED ORDER — LORAZEPAM 2 MG/ML IJ SOLN: 1.0000 mg | Freq: Once | INTRAMUSCULAR | Status: DC

## 2011-05-15 MED ORDER — SODIUM CHLORIDE 0.9 % IV SOLN
INTRAVENOUS | Status: DC
  Administered 2011-05-15: 10:00:00 via INTRAVENOUS

## 2011-05-15 NOTE — Progress Notes (Signed)
1210 Normal Saline IVF complete as ordered. Patient with no complaints. Tolerated treatment well. Nausea has subsided. VSS.

## 2011-05-15 NOTE — Progress Notes (Signed)
Mr. Trigueros reports not feeling well today.  He reports he ran out of some medications and had woken up with severe nausea and vomiting.  He is here today receiving fluids.  His weight has declined to 201.2 pounds documented May 9 from 210.4 pounds documented April 27.  Glucose was noted as 143 and BUN of 24.  The patient states that he continues to consume Ensure Plus 6 cans daily through his feeding tube in addition to 1 scoop of protein powder.  He was eating 2 meals a day up until the last day or so when his nausea and vomiting prevented that.  NUTRITION DIAGNOSIS:  Of inadequate oral intake continues.  INTERVENTION:  I educated the patient on the importance of increasing Ensure Plus to 8 cans daily via feeding tube with nausea and vomiting controlled.  This will provide approximately 2800 calories, 126 g of protein total including the 1 scoop of protein powder that the patient is using.  This does meet patient's minimum estimated nutrition needs.  He is to continue free water flushes and water by mouth as tolerated, to be sure to meet minimum fluid needs of approximately 3L.  The patient verbalizes understanding and agreement.  MONITORING, EVALUATION, GOALS:  The patient continues to tolerate tube feedings.  He is to increase tube feedings to 8 cans of Ensure Plus daily with additional whey protein powder to meet estimated needs for minimal weight loss.  NEXT VISIT:  Tuesday, May 14.    ______________________________ Zenovia Jarred, RD, LDN Clinical Nutrition Specialist BN/MEDQ  D:  05/15/2011  T:  05/15/2011  Job:  [id number]

## 2011-05-15 NOTE — Patient Instructions (Signed)
[facility] Discharge Instructions for Patients Receiving Chemotherapy  Today you received Normal Saline infusion.  To help prevent nausea and vomiting after your treatment, we encourage you to take your nausea medication. Take your nausea medication as prescribed by your physician.    If you develop nausea and vomiting that is not controlled by your nausea medication, call the clinic at [phone]. If it is after clinic hours your family physician or the after hours number for the clinic or go to the Emergency Department.   BELOW ARE SYMPTOMS THAT SHOULD BE REPORTED IMMEDIATELY:  *FEVER GREATER THAN 100.5 F  *CHILLS WITH OR WITHOUT FEVER  NAUSEA AND VOMITING THAT IS NOT CONTROLLED WITH YOUR NAUSEA MEDICATION  *UNUSUAL SHORTNESS OF BREATH  *UNUSUAL BRUISING OR BLEEDING  TENDERNESS IN MOUTH AND THROAT WITH OR WITHOUT PRESENCE OF ULCERS  *URINARY PROBLEMS  *BOWEL PROBLEMS  UNUSUAL RASH Items with * indicate a potential emergency and should be followed up as soon as possible.  One of the nurses will contact you 24 hours after your treatment. Please let the nurse know about any problems that you may have experienced. Feel free to call the clinic you have any questions or concerns. The clinic phone number is [phone].   I have been informed and understand all the instructions given to me. I know to contact the clinic, my physician, or go to the Emergency Department if any problems should occur. I do not have any questions at this time, but understand that I may call the clinic during office hours   should I have any questions or need assistance in obtaining follow up care.    __________________________________________  _____________  __________ Signature of Patient or Authorized Representative            Date                   Time    __________________________________________ Nurse's Signature

## 2011-05-16 ENCOUNTER — Ambulatory Visit (HOSPITAL_BASED_OUTPATIENT_CLINIC_OR_DEPARTMENT_OTHER): Payer: BC Managed Care – PPO

## 2011-05-16 VITALS — BP 119/78 | HR 93 | Temp 99.2°F

## 2011-05-16 DIAGNOSIS — C099 Malignant neoplasm of tonsil, unspecified: Secondary | ICD-10-CM

## 2011-05-16 MED ORDER — SODIUM CHLORIDE 0.9 % IV SOLN
INTRAVENOUS | Status: DC
  Administered 2011-05-16: 08:00:00 via INTRAVENOUS

## 2011-05-17 ENCOUNTER — Encounter (HOSPITAL_COMMUNITY): Payer: Self-pay | Admitting: Emergency Medicine

## 2011-05-17 ENCOUNTER — Inpatient Hospital Stay (HOSPITAL_COMMUNITY)
Admission: EM | Admit: 2011-05-17 | Discharge: 2011-05-26 | DRG: 398 | Disposition: A | Discharge status: Home / Self Care | Payer: BC Managed Care – PPO | Attending: Internal Medicine | Admitting: Internal Medicine

## 2011-05-17 ENCOUNTER — Emergency Department (HOSPITAL_COMMUNITY): Payer: BC Managed Care – PPO

## 2011-05-17 DIAGNOSIS — L02219 Cutaneous abscess of trunk, unspecified: Secondary | ICD-10-CM | POA: Diagnosis present

## 2011-05-17 DIAGNOSIS — Z9221 Personal history of antineoplastic chemotherapy: Secondary | ICD-10-CM

## 2011-05-17 DIAGNOSIS — T2007XA Burn of unspecified degree of neck, initial encounter: Secondary | ICD-10-CM | POA: Diagnosis present

## 2011-05-17 DIAGNOSIS — K1230 Oral mucositis (ulcerative), unspecified: Secondary | ICD-10-CM

## 2011-05-17 DIAGNOSIS — C099 Malignant neoplasm of tonsil, unspecified: Secondary | ICD-10-CM | POA: Diagnosis present

## 2011-05-17 DIAGNOSIS — T451X5A Adverse effect of antineoplastic and immunosuppressive drugs, initial encounter: Secondary | ICD-10-CM | POA: Diagnosis present

## 2011-05-17 DIAGNOSIS — D638 Anemia in other chronic diseases classified elsewhere: Secondary | ICD-10-CM | POA: Insufficient documentation

## 2011-05-17 DIAGNOSIS — M542 Cervicalgia: Secondary | ICD-10-CM | POA: Diagnosis present

## 2011-05-17 DIAGNOSIS — Z923 Personal history of irradiation: Secondary | ICD-10-CM

## 2011-05-17 DIAGNOSIS — C801 Malignant (primary) neoplasm, unspecified: Secondary | ICD-10-CM

## 2011-05-17 DIAGNOSIS — D6481 Anemia due to antineoplastic chemotherapy: Secondary | ICD-10-CM | POA: Diagnosis present

## 2011-05-17 DIAGNOSIS — K121 Other forms of stomatitis: Secondary | ICD-10-CM | POA: Diagnosis present

## 2011-05-17 DIAGNOSIS — D6959 Other secondary thrombocytopenia: Secondary | ICD-10-CM | POA: Diagnosis present

## 2011-05-17 DIAGNOSIS — B370 Candidal stomatitis: Secondary | ICD-10-CM | POA: Diagnosis present

## 2011-05-17 DIAGNOSIS — K9422 Gastrostomy infection: Secondary | ICD-10-CM | POA: Diagnosis present

## 2011-05-17 DIAGNOSIS — L03319 Cellulitis of trunk, unspecified: Secondary | ICD-10-CM | POA: Diagnosis present

## 2011-05-17 DIAGNOSIS — E46 Unspecified protein-calorie malnutrition: Secondary | ICD-10-CM | POA: Diagnosis present

## 2011-05-17 DIAGNOSIS — L299 Pruritus, unspecified: Secondary | ICD-10-CM

## 2011-05-17 DIAGNOSIS — D702 Other drug-induced agranulocytosis: Principal | ICD-10-CM | POA: Diagnosis present

## 2011-05-17 DIAGNOSIS — D630 Anemia in neoplastic disease: Secondary | ICD-10-CM | POA: Diagnosis present

## 2011-05-17 DIAGNOSIS — D696 Thrombocytopenia, unspecified: Secondary | ICD-10-CM | POA: Diagnosis present

## 2011-05-17 DIAGNOSIS — R51 Headache: Secondary | ICD-10-CM | POA: Diagnosis present

## 2011-05-17 DIAGNOSIS — Y842 Radiological procedure and radiotherapy as the cause of abnormal reaction of the patient, or of later complication, without mention of misadventure at the time of the procedure: Secondary | ICD-10-CM | POA: Diagnosis present

## 2011-05-17 DIAGNOSIS — Y833 Surgical operation with formation of external stoma as the cause of abnormal reaction of the patient, or of later complication, without mention of misadventure at the time of the procedure: Secondary | ICD-10-CM | POA: Diagnosis present

## 2011-05-17 DIAGNOSIS — E871 Hypo-osmolality and hyponatremia: Secondary | ICD-10-CM | POA: Diagnosis present

## 2011-05-17 DIAGNOSIS — R5081 Fever presenting with conditions classified elsewhere: Secondary | ICD-10-CM

## 2011-05-17 DIAGNOSIS — J45909 Unspecified asthma, uncomplicated: Secondary | ICD-10-CM | POA: Diagnosis present

## 2011-05-17 DIAGNOSIS — R112 Nausea with vomiting, unspecified: Secondary | ICD-10-CM | POA: Diagnosis present

## 2011-05-17 DIAGNOSIS — D709 Neutropenia, unspecified: Secondary | ICD-10-CM | POA: Diagnosis present

## 2011-05-17 LAB — DIFFERENTIAL
Basophils Absolute: 0 10*3/uL (ref 0.0–0.1)
Basophils Relative: 0 % (ref 0–1)
Lymphocytes Relative: 11 % — ABNORMAL LOW (ref 12–46)
Monocytes Relative: 18 % — ABNORMAL HIGH (ref 3–12)
Neutro Abs: 0.8 10*3/uL — ABNORMAL LOW (ref 1.7–7.7)

## 2011-05-17 LAB — BASIC METABOLIC PANEL
BUN: 11 mg/dL (ref 6–23)
Chloride: 95 mEq/L — ABNORMAL LOW (ref 96–112)
GFR calc Af Amer: >90 mL/min (ref >90)
Potassium: 3.8 mEq/L (ref 3.5–5.1)
Sodium: 132 mEq/L — ABNORMAL LOW (ref 135–145)

## 2011-05-17 LAB — CBC
HCT: 30.8 % — ABNORMAL LOW (ref 39.0–52.0)
Hemoglobin: 10.4 g/dL — ABNORMAL LOW (ref 13.0–17.0)
RDW: 14.6 % (ref 11.5–15.5)
WBC: 1.1 10*3/uL — CL (ref 4.0–10.5)

## 2011-05-17 MED ORDER — ONDANSETRON HCL 4 MG/2ML IJ SOLN
4.0000 mg | Freq: Once | INTRAMUSCULAR | Status: AC
  Administered 2011-05-17: 4 mg via INTRAVENOUS
  Filled 2011-05-17: qty 2

## 2011-05-17 MED ORDER — SODIUM CHLORIDE 0.9 % IV BOLUS (SEPSIS)
1000.0000 mL | Freq: Once | INTRAVENOUS | Status: AC
  Administered 2011-05-17: 1000 mL via INTRAVENOUS

## 2011-05-17 MED ORDER — HYDROMORPHONE HCL PF 1 MG/ML IJ SOLN
1.0000 mg | Freq: Once | INTRAMUSCULAR | Status: AC
  Administered 2011-05-17: 1 mg via INTRAVENOUS
  Filled 2011-05-17: qty 1

## 2011-05-17 NOTE — ED Notes (Signed)
Pt given a urinal and pt stated that he will attempt to give an urine sample shortly

## 2011-05-17 NOTE — ED Notes (Signed)
Pt alert, nad, arrives from home, c/o low grade fever, pain, hx of throat cancer, airway patent, resp even unlabored, no stridor noted, last treatment radiation was Friday, oncologist notified

## 2011-05-17 NOTE — ED Notes (Signed)
Placed on neutropenic precautions.  WBC 1.1

## 2011-05-18 ENCOUNTER — Ambulatory Visit: Payer: Self-pay

## 2011-05-18 ENCOUNTER — Ambulatory Visit: Payer: BC Managed Care – PPO

## 2011-05-18 ENCOUNTER — Encounter (HOSPITAL_COMMUNITY): Payer: Self-pay | Admitting: *Deleted

## 2011-05-18 ENCOUNTER — Ambulatory Visit: Payer: Self-pay | Admitting: Oncology

## 2011-05-18 ENCOUNTER — Emergency Department (HOSPITAL_COMMUNITY): Payer: BC Managed Care – PPO

## 2011-05-18 DIAGNOSIS — D709 Neutropenia, unspecified: Secondary | ICD-10-CM

## 2011-05-18 DIAGNOSIS — D696 Thrombocytopenia, unspecified: Secondary | ICD-10-CM | POA: Diagnosis present

## 2011-05-18 DIAGNOSIS — R509 Fever, unspecified: Secondary | ICD-10-CM

## 2011-05-18 DIAGNOSIS — C099 Malignant neoplasm of tonsil, unspecified: Secondary | ICD-10-CM

## 2011-05-18 DIAGNOSIS — E871 Hypo-osmolality and hyponatremia: Secondary | ICD-10-CM | POA: Diagnosis present

## 2011-05-18 DIAGNOSIS — R112 Nausea with vomiting, unspecified: Secondary | ICD-10-CM

## 2011-05-18 DIAGNOSIS — D638 Anemia in other chronic diseases classified elsewhere: Secondary | ICD-10-CM | POA: Insufficient documentation

## 2011-05-18 DIAGNOSIS — R5081 Fever presenting with conditions classified elsewhere: Secondary | ICD-10-CM | POA: Diagnosis present

## 2011-05-18 DIAGNOSIS — M311 Thrombotic microangiopathy: Secondary | ICD-10-CM

## 2011-05-18 LAB — DIFFERENTIAL
Band Neutrophils: 0 % (ref 0–10)
Basophils Absolute: 0 10*3/uL (ref 0.0–0.1)
Eosinophils Absolute: 0 10*3/uL (ref 0.0–0.7)
Lymphs Abs: 0 10*3/uL — ABNORMAL LOW (ref 0.7–4.0)
Monocytes Relative: 0 % — ABNORMAL LOW (ref 3–12)
nRBC: 0 /100 WBC

## 2011-05-18 LAB — CBC
HCT: 27.6 % — ABNORMAL LOW (ref 39.0–52.0)
MCH: 30.7 pg (ref 26.0–34.0)
MCV: 90.2 fL (ref 78.0–100.0)
Platelets: 139 10*3/uL — ABNORMAL LOW (ref 150–400)
RDW: 14.6 % (ref 11.5–15.5)

## 2011-05-18 LAB — URINALYSIS, ROUTINE W REFLEX MICROSCOPIC
Bilirubin Urine: NEGATIVE
Leukocytes, UA: NEGATIVE
Nitrite: NEGATIVE
Specific Gravity, Urine: 1.024 (ref 1.005–1.030)
pH: 6 (ref 5.0–8.0)

## 2011-05-18 LAB — COMPREHENSIVE METABOLIC PANEL
ALT: 17 U/L (ref 0–53)
AST: 11 U/L (ref 0–37)
Albumin: 3 g/dL — ABNORMAL LOW (ref 3.5–5.2)
Alkaline Phosphatase: 54 U/L (ref 39–117)
Chloride: 94 mEq/L — ABNORMAL LOW (ref 96–112)
Potassium: 3.8 mEq/L (ref 3.5–5.1)
Sodium: 130 mEq/L — ABNORMAL LOW (ref 135–145)
Total Bilirubin: 0.4 mg/dL (ref 0.3–1.2)
Total Protein: 6.4 g/dL (ref 6.0–8.3)

## 2011-05-18 LAB — PATHOLOGIST SMEAR REVIEW

## 2011-05-18 LAB — PROTIME-INR
INR: 1.05 (ref 0.00–1.49)
Prothrombin Time: 13.9 seconds (ref 11.6–15.2)

## 2011-05-18 LAB — MAGNESIUM: Magnesium: 2.1 mg/dL (ref 1.5–2.5)

## 2011-05-18 LAB — HEMOGLOBIN A1C: Hgb A1c MFr Bld: 5.8 % — ABNORMAL HIGH (ref <5.7)

## 2011-05-18 LAB — PHOSPHORUS: Phosphorus: 2.5 mg/dL (ref 2.3–4.6)

## 2011-05-18 LAB — TSH: TSH: 0.515 u[IU]/mL (ref 0.350–4.500)

## 2011-05-18 LAB — APTT: aPTT: 33 seconds (ref 24–37)

## 2011-05-18 MED ORDER — PROMETHAZINE HCL 25 MG RE SUPP
25.0000 mg | Freq: Two times a day (BID) | RECTAL | Status: DC | PRN
  Administered 2011-05-18: 25 mg via RECTAL
  Filled 2011-05-18 (×3): qty 1

## 2011-05-18 MED ORDER — POLYETHYLENE GLYCOL 3350 17 G PO PACK
17.0000 g | PACK | Freq: Every day | ORAL | Status: DC
  Administered 2011-05-18: 17 g via ORAL
  Filled 2011-05-18: qty 1

## 2011-05-18 MED ORDER — ONDANSETRON HCL 4 MG/2ML IJ SOLN
4.0000 mg | Freq: Four times a day (QID) | INTRAMUSCULAR | Status: DC | PRN
  Administered 2011-05-18: 4 mg via INTRAVENOUS
  Filled 2011-05-18: qty 2

## 2011-05-18 MED ORDER — FENTANYL 25 MCG/HR TD PT72: 25.0000 ug | MEDICATED_PATCH | TRANSDERMAL | Status: DC

## 2011-05-18 MED ORDER — OSMOLITE 1.5 CAL PO LIQD
237.0000 mL | Freq: Four times a day (QID) | ORAL | Status: DC
  Administered 2011-05-18 (×2): 237 mL
  Filled 2011-05-18 (×6): qty 237

## 2011-05-18 MED ORDER — HYDROCODONE-ACETAMINOPHEN 7.5-500 MG/15ML PO SOLN: 15.0000 mL | ORAL | Status: DC | PRN

## 2011-05-18 MED ORDER — ENSURE COMPLETE PO LIQD
237.0000 mL | Freq: Two times a day (BID) | ORAL | Status: DC
  Administered 2011-05-19 – 2011-05-21 (×5): 237 mL via ORAL

## 2011-05-18 MED ORDER — GUAIFENESIN 100 MG/5ML PO SOLN
200.0000 mg | ORAL | Status: DC | PRN
  Administered 2011-05-18 – 2011-05-21 (×13): 200 mg via ORAL
  Filled 2011-05-18: qty 118
  Filled 2011-05-18 (×4): qty 10
  Filled 2011-05-18: qty 118
  Filled 2011-05-18 (×2): qty 10
  Filled 2011-05-18 (×2): qty 118
  Filled 2011-05-18: qty 10
  Filled 2011-05-18 (×2): qty 118

## 2011-05-18 MED ORDER — PROCHLORPERAZINE MALEATE 10 MG PO TABS
10.0000 mg | ORAL_TABLET | Freq: Four times a day (QID) | ORAL | Status: DC | PRN
  Administered 2011-05-21 – 2011-05-25 (×4): 10 mg via ORAL
  Filled 2011-05-18 (×6): qty 1

## 2011-05-18 MED ORDER — BUTALBITAL-APAP-CAFFEINE 50-325-40 MG PO TABS
2.0000 | ORAL_TABLET | Freq: Four times a day (QID) | ORAL | Status: DC | PRN
  Administered 2011-05-18 – 2011-05-19 (×2): 2 via ORAL
  Filled 2011-05-18 (×3): qty 2

## 2011-05-18 MED ORDER — HYDROMORPHONE HCL PF 2 MG/ML IJ SOLN
2.0000 mg | INTRAMUSCULAR | Status: DC | PRN
  Administered 2011-05-18 – 2011-05-25 (×24): 2 mg via INTRAVENOUS
  Filled 2011-05-18 (×24): qty 1

## 2011-05-18 MED ORDER — DIPHENHYD-HYDROCORT-NYSTATIN MT SUSP: 15.0000 mL | Freq: Four times a day (QID) | OROMUCOSAL | Status: DC | PRN

## 2011-05-18 MED ORDER — CEFEPIME HCL 2 G IJ SOLR
2.0000 g | Freq: Three times a day (TID) | INTRAMUSCULAR | Status: DC
  Administered 2011-05-18 – 2011-05-20 (×6): 2 g via INTRAVENOUS
  Filled 2011-05-18 (×8): qty 2

## 2011-05-18 MED ORDER — DEXTROSE 5 % IV SOLN
2.0000 g | Freq: Once | INTRAVENOUS | Status: AC
  Administered 2011-05-18: 2 g via INTRAVENOUS
  Filled 2011-05-18: qty 2

## 2011-05-18 MED ORDER — ENSURE COMPLETE PO LIQD
237.0000 mL | Freq: Every day | ORAL | Status: DC
Start: 2011-05-18 — End: 2011-05-18
  Administered 2011-05-18 (×3): 237 mL via ORAL
  Filled 2011-05-18 (×2): qty 237

## 2011-05-18 MED ORDER — SERTRALINE HCL 100 MG PO TABS
100.0000 mg | ORAL_TABLET | Freq: Every day | ORAL | Status: DC
  Administered 2011-05-18 – 2011-05-25 (×8): 100 mg via ORAL
  Filled 2011-05-18 (×9): qty 1

## 2011-05-18 MED ORDER — VANCOMYCIN HCL IN DEXTROSE 1-5 GM/200ML-% IV SOLN
1000.0000 mg | Freq: Once | INTRAVENOUS | Status: AC
  Administered 2011-05-18: 1000 mg via INTRAVENOUS
  Filled 2011-05-18: qty 200

## 2011-05-18 MED ORDER — SODIUM CHLORIDE 0.9 % IV SOLN: INTRAVENOUS | Status: AC

## 2011-05-18 MED ORDER — HYDROMORPHONE HCL 1 MG/ML PO LIQD
1.0000 mg | ORAL | Status: DC | PRN
  Administered 2011-05-18 – 2011-05-21 (×13): 1 mg via ORAL
  Filled 2011-05-18 (×17): qty 1

## 2011-05-18 MED ORDER — LORAZEPAM 0.5 MG PO TABS
0.5000 mg | ORAL_TABLET | Freq: Four times a day (QID) | ORAL | Status: DC | PRN
  Administered 2011-05-18 – 2011-05-25 (×8): 0.5 mg via ORAL
  Filled 2011-05-18 (×8): qty 1

## 2011-05-18 MED ORDER — PRO-STAT SUGAR FREE PO LIQD
30.0000 mL | Freq: Three times a day (TID) | ORAL | Status: DC
  Administered 2011-05-18 – 2011-05-19 (×2): 30 mL
  Filled 2011-05-18 (×5): qty 30

## 2011-05-18 MED ORDER — HYDROMORPHONE HCL PF 1 MG/ML IJ SOLN: 1.0000 mg | INTRAMUSCULAR | Status: DC | PRN

## 2011-05-18 MED ORDER — FLUCONAZOLE 100MG IVPB
100.0000 mg | INTRAVENOUS | Status: DC
  Administered 2011-05-18 – 2011-05-20 (×3): 100 mg via INTRAVENOUS
  Filled 2011-05-18 (×3): qty 50

## 2011-05-18 MED ORDER — VANCOMYCIN HCL IN DEXTROSE 1-5 GM/200ML-% IV SOLN
1000.0000 mg | Freq: Three times a day (TID) | INTRAVENOUS | Status: DC
  Administered 2011-05-18 – 2011-05-19 (×4): 1000 mg via INTRAVENOUS
  Filled 2011-05-18 (×7): qty 200

## 2011-05-18 MED ORDER — MAGIC MOUTHWASH
15.0000 mL | Freq: Four times a day (QID) | ORAL | Status: DC | PRN
  Administered 2011-05-18 – 2011-05-25 (×9): 15 mL via ORAL
  Filled 2011-05-18 (×10): qty 15

## 2011-05-18 MED ORDER — MAGIC MOUTHWASH W/LIDOCAINE
5.0000 mL | Freq: Three times a day (TID) | ORAL | Status: DC | PRN
  Administered 2011-05-19: 5 mL via ORAL
  Filled 2011-05-18 (×2): qty 5

## 2011-05-18 MED ORDER — ONDANSETRON HCL 4 MG PO TABS: 4.0000 mg | ORAL_TABLET | Freq: Four times a day (QID) | ORAL | Status: DC | PRN

## 2011-05-18 MED ORDER — FENTANYL 50 MCG/HR TD PT72
50.0000 ug | MEDICATED_PATCH | TRANSDERMAL | Status: DC
  Administered 2011-05-19 – 2011-05-25 (×3): 50 ug via TRANSDERMAL
  Filled 2011-05-18 (×4): qty 1

## 2011-05-18 MED ORDER — OXYCODONE HCL 5 MG/5ML PO SOLN: 5.0000 mg | ORAL | Status: DC | PRN

## 2011-05-18 MED ORDER — POLYETHYLENE GLYCOL 3350 17 G PO PACK
17.0000 g | PACK | Freq: Every day | ORAL | Status: DC | PRN
  Filled 2011-05-18: qty 1

## 2011-05-18 MED ORDER — PROMETHAZINE HCL 25 MG RE SUPP
25.0000 mg | Freq: Four times a day (QID) | RECTAL | Status: DC | PRN
  Administered 2011-05-18 – 2011-05-26 (×17): 25 mg via RECTAL
  Filled 2011-05-18 (×17): qty 1

## 2011-05-18 MED ORDER — PANTOPRAZOLE SODIUM 40 MG IV SOLR
40.0000 mg | INTRAVENOUS | Status: DC
  Administered 2011-05-18 – 2011-05-20 (×3): 40 mg via INTRAVENOUS
  Filled 2011-05-18 (×4): qty 40

## 2011-05-18 NOTE — Progress Notes (Signed)
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 Patient reports having sore throat and right sided neck pain of same duration. Patient also reports having cough productive of yellowish to brownish sputum for past 2-3 weeks. He has had difficulty tolerating chemo treatments requiring admission after both for persistent n/v and dehydration. His next treatment is scheduled for tomorrow. Inpt workup has included a neck CT that actually shows right neck mass is decreasing in size. CXR shows LLL atx, u/a is unremarkable. Placed on empiric Cefepime and Vancomycin on admit for neutropenic fever.  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 Gaylyn Rong  Procedures:  none  Antibiotics:  Cefepime 5/13--->  Vancomycin 5/13-->  Microbiology Blood cultures 5/12 pending  Interim History: Highest recorded temp 99.5 this am.   Subjective: Persistent nausea worse after Miralax just given on my arrival to room. States phenergan suppositories most effective at relieving nausea. States he has had some redness around PEG tube site for a couple of days, but any drainage he's noticed has been ensure.  Objective: Filed Vitals:   05/18/11 0209 05/18/11 0257 05/18/11 0329 05/18/11 0543  BP:  113/65 123/74 133/77  Pulse:  104 112 90  Temp: 99.5 F (37.5 C) 98.8 F (37.1 C) 98.5 F (36.9 C) 98.8 F (37.1 C)  TempSrc: Oral Oral Oral Oral  Resp:  18 18 18   Height:   5\' 8"  (1.727 m)   Weight:   92.8 kg (204 lb 9.4 oz)   SpO2:  97% 96% 97%    Intake/Output Summary  (Last 24 hours) at 05/18/11 1007 Last data filed at 05/18/11 0607  Gross per 24 hour  Intake    505 ml  Output    300 ml  Net    205 ml    Exam:  General: awake, alert sitting on bedside in NAD CV: S1S2, slightly tachycardic but vomiting on exam, no m/r/g Resp: CTAB, no w/r/c, no increased wob GI: abdomen soft, NT/ND, BS+. PEG site with some surrounding erythema, no drainage Neuro: no m/s deficits on exam Psych: aox3, normal mood and affect  Data Reviewed: Basic Metabolic Panel:  Lab 05/18/11 8119 05/17/11 2149 05/15/11 0838  NA 130* 132* 134*  K 3.8 3.8 --  CL 94* 95* 94*  CO2 27 29 33*  GLUCOSE 105* 116* 98  BUN 10 11 17   CREATININE 0.70 0.72 0.85  CALCIUM 8.5 8.8 9.1  MG 2.1 -- --  PHOS 2.5 -- --   Liver Function Tests:  Lab 05/18/11 0425 05/15/11 0838  AST 11 17  ALT 17 32  ALKPHOS 54 61  BILITOT 0.4 0.6  PROT 6.4 6.9  ALBUMIN 3.0* 4.3   No results found for this basename: LIPASE:5,AMYLASE:5 in the last 168 hours No results found for this basename: AMMONIA:5 in the last 168 hours CBC:  Lab 05/18/11 0425 05/17/11 2149 05/15/11 0838  WBC 0.9* 1.1* 1.9*  NEUTROABS -- 0.8* 1.4*  HGB 9.4* 10.4* 11.7*  HCT 27.6* 30.8* 33.9*  MCV 90.2 90.6 88.3  PLT 139* 142* 139*   Cardiac Enzymes: No results found for this basename: CKTOTAL:5,CKMB:5,CKMBINDEX:5,TROPONINI:5 in the last  168 hours BNP: No components found with this basename: POCBNP:5 CBG:  Lab 05/18/11 0721  GLUCAP 95    No results found for this or any previous visit (from the past 240 hour(s)).   Studies: Dg Chest 2 View  05/17/2011  *RADIOLOGY REPORT*  Clinical Data: Fever.  Right tonsillar cancer.  Ongoing chemotherapy.  Asthma.  CHEST - 2 VIEW  Comparison: 04/23/2011  Findings: Linear subsegmental atelectasis noted in the left lower lobe.  Mildly low lung volumes are present.  Cardiac and mediastinal contours appear unremarkable.  No pleural effusion noted.  The lungs are otherwise clear.   IMPRESSION:  1.  Linear subsegmental atelectasis in the left lower lobe. Otherwise, no significant abnormality identified.  Original Report Authenticated By: Dellia Cloud, M.D.   Dg Chest 2 View  04/24/2011  *RADIOLOGY REPORT*  Clinical Data: Nausea, emesis, and diarrhea  CHEST - 2 VIEW  Comparison: None.  Findings: The heart, mediastinal, and hilar contours are normal. The lungs are well-expanded and clear. Negative for pleural effusion. The bony thorax is unremarkable.  IMPRESSION: No acute cardiopulmonary disease.  Original Report Authenticated By: Britta Mccreedy, M.D.   Dg Abd 1 View  05/03/2011  *RADIOLOGY REPORT*  Clinical Data: Evaluate for ileus  ABDOMEN - 1 VIEW  Comparison: 10/25/2011  Findings: Within the left upper quadrant of the abdomen and there is a gastrostomy tube.  The bowel gas pattern appears nonobstructed.  No dilated loops of small bowel or fluid levels noted.  Some colonic gas is noted.  Prior lower lumbar spine laminectomy and fusion noted.  IMPRESSION:  1.  Nonobstructive bowel gas pattern.  Original Report Authenticated By: Rosealee Albee, M.D.   Ct Soft Tissue Neck Wo Contrast  05/18/2011  *RADIOLOGY REPORT*  Clinical Data: Sore throat, body aches, low grade fever.  History of parotid tumor with radiation treatments.  Contrast allergy.  CT NECK WITHOUT CONTRAST  Technique:  Multidetector CT imaging of the neck was performed without intravenous contrast.  Comparison: PET CT scan 02/25/2011  Findings: Enlarged lymph node behind the angle of the mandible on the right measures 1.7 x 1.7 cm.  There is central low attenuation suggesting necrosis.  This is smaller than on the previous study. There are diffusely scattered lymph nodes throughout the anterior and posterior cervical regions bilaterally which are not pathologically enlarged.  No significant tonsillar or mucosal swelling.  Salivary glands appear symmetrical.  Laryngeal structures appear symmetrical.  No new mass or  lymphadenopathy is appreciated.  Degenerative changes in the cervical spine.  Reversal of the usual cervical lordosis, likely due to patient positioning. No prevertebral soft tissue swelling.  IMPRESSION: Mass in the right neck behind the angle of the mandible is decreasing in size.  No new mass, lymphadenopathy, or fluid collections identified.  Original Report Authenticated By: Marlon Pel, M.D.    Scheduled Meds:   . ceFEPime (MAXIPIME) IV  2 g Intravenous Once  . ceFEPime (MAXIPIME) IV  2 g Intravenous Q8H  . feeding supplement  237 mL Oral 6 X Daily  . fentaNYL  25 mcg Transdermal Q72H  . fluconazole (DIFLUCAN) IV  100 mg Intravenous Q24H  .  HYDROmorphone (DILAUDID) injection  1 mg Intravenous Once  .  HYDROmorphone (DILAUDID) injection  1 mg Intravenous Once  . ondansetron (ZOFRAN) IV  4 mg Intravenous Once  . ondansetron (ZOFRAN) IV  4 mg Intravenous Once  . pantoprazole (PROTONIX) IV  40 mg Intravenous Q24H  . sertraline  100  mg Oral QHS  . sodium chloride  1,000 mL Intravenous Once  . vancomycin  1,000 mg Intravenous Once  . vancomycin  1,000 mg Intravenous Q8H  . DISCONTD: polyethylene glycol  17 g Oral Daily   Continuous Infusions:   . sodium chloride 75 mL/hr at 05/18/11 0243     Assessment/Plan:  *Neutropenic fever  - secondary to immunosuppresion secondary to concurrent chemotherapy and radiation therapy  - on vancomycin and cefepime D1 - CXR with left lower lobe atelectasis, urinalysis negative  -He does appear to have some mild cellulitis around his PEG tube site, but no drainage available for culture - follow up blood culture results  - neutropenic precaution  - Dr. Gaylyn Rong of oncology is following  *Right tonsillar squamous cell carcinoma - Concurrent chemoradiation with q3wk Cisplatin 100mg /m2 and daily radiation; cisplatin was started on 04/07/11. -CT scan actually shows neck mass is decreasing in size -Appreciate Dr. Gaylyn Rong following  *Oral  trush -secondary to immunosuppression/ chemotherapy and radiation therapy  - contiue fluconazole per pharmacy  * Mucositis/xerostomia:  -Continue salt/baking soda rinses and alternate with Robitussin for thick phlegm.   *Nausea & vomiting  - secondary to chemotherapy and radiation therapy  - will change phenergan suppository to q6 prn as pt states zofran is ineffective at relieving symptoms  *Anemia of chronic disease  - secondary to history of tonsillar carcinoma  - hemoglobin on admission is 10.4 now 9.4 likely dilutional component contributing to drop - continue to monitor for now   *Thrombocytopenia  - secondary to chemotherapy  - platelets slightly below normal range, 139<--142  - no signs of active bleed  - continue to monitor   *Hyponatremia  - perhaps due to dehydration versus SIADH syndrome/ paraneoplastic syndrome  - TSH pending  - continue NS at 75cc/hr for now - follow up BMP in am  Code Status: full Family Communication: test results and plan of care discussed with patient Disposition Plan: home when medically ready.   Cordelia Pen, NP-C [facility] Service [facility]  pgr [phone]   Addendum Discussed with Dr. Gaylyn Rong, the patient will be transferred to his service   If 7PM-7AM, please contact night-coverage [email or web address] Password [email or web address] 05/18/2011, 10:07 AM    LOS: 1 day

## 2011-05-18 NOTE — Progress Notes (Signed)
05/18/2011 PT spoke with patient who reports he is ambulating in his room independently and doesn't feel like his mobility has been affected this admission.  He politely declined PT services and was encouraged to walk the halls and be OOB in the chair multiple times per day.  He agreed and has masks in his room to use when he ambulates in the hallway.  PT to sign off.  Thanks, Rollene Rotunda. Marlyne Totaro, PT, DPT [phone]

## 2011-05-18 NOTE — ED Provider Notes (Signed)
History     CSN: [id number]  Arrival date & time 05/17/11  2049   First MD Initiated Contact with Patient 05/17/11 2124      Chief Complaint  Patient presents with  . Fever  . Generalized Body Aches  . 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  Reports home pain medications are not helping with the pain as well.    Patient is a 46 y.o. male presenting with fever and pharyngitis. The history is provided by the patient. No language interpreter was used.  Fever Primary symptoms of the febrile illness include fever, fatigue, cough, nausea, myalgias, arthralgias and rash. Primary symptoms do not include visual change, headaches, wheezing, shortness of breath, abdominal pain, vomiting, diarrhea, dysuria or altered mental status. The current episode started yesterday. This is a recurrent problem. The problem has been rapidly worsening.  Associated with: chemo and radiation.  Sore Throat Associated symptoms include arthralgias, coughing, fatigue, a fever, myalgias, nausea, neck pain and a rash. Pertinent negatives include no abdominal pain, headaches, visual change or vomiting.    Past Medical History  Diagnosis Date  . Nasal polyps   . 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 Depression   . Fatigue   . Degeneration of lumbar intervertebral disc   . Allergy     seasonal  . Asthma     seasonal  . 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 TMJ (dislocation of temporomandibular joint)   . Sleep apnea      Past Surgical History  Procedure Date  . Anterior release vertebral body w/ posterior fusion     lumbar  . Back surgery 09/1999    L4-5 fusion  . Multiple tooth extractions March 2013    6 teeth removed  . Peg placement March 2013    Placed at [place] [facility]  . Panendoscopy 03/18/2011    Procedure: PANENDOSCOPY;  Surgeon: Flo Shanks, MD;  Location: [facility] OR;  Service: ENT;  Laterality: N/A;  PANDENDONOSCOPY  . Tonsillectomy 03/18/2011    Procedure: TONSILLECTOMY;  Surgeon: Flo Shanks, MD;  Location: [facility] OR;  Service: ENT;  Laterality: Right;    Family History  Problem Relation Age of Onset  . Diabetes Mother   . Hypertension Mother   . Stroke Maternal Grandfather     History  Substance Use Topics  . Smoking status: Never Smoker   . Smokeless tobacco: Former Neurosurgeon    Types: Chew    Quit date: 03/04/1999  . Alcohol Use: Yes     socially      Review of Systems  Constitutional: Positive for fever and fatigue.  HENT: Positive for facial swelling and neck pain.   Respiratory: Positive for cough. Negative for shortness of breath and wheezing.   Gastrointestinal: Positive for nausea. Negative for vomiting, abdominal pain and diarrhea.  Genitourinary: Negative for dysuria.  Musculoskeletal: Positive for myalgias and arthralgias.  Skin: Positive for rash.  Neurological: Negative for headaches.  Psychiatric/Behavioral: Negative for altered mental status.  All other systems reviewed and are negative.    Allergies  Contrast  media; Iodine; Shellfish allergy; Betadine; Avelox; Morphine and related; Prednisone; and Ultram  Home Medications   Current Outpatient Rx  Name Route Sig Dispense Refill  . DIPHENHYD-HYDROCORT-NYSTATIN MT SUSP Mouth/Throat Use as directed 15 mLs in the mouth or throat 4 (four) times daily as needed (Swish, gargle and spit as needed for mouth sores). 500 mL 3  . ENSURE COMPLETE PO LIQD Oral Take 237 mLs by mouth 6 (six) times daily.    . FENTANYL 25  MCG/HR TD PT72 Transdermal Place 1 patch (25 mcg total) onto the skin every 3 (three) days. 10 patch 0  . HYDROCODONE-ACETAMINOPHEN 7.5-500 MG/15ML PO SOLN  15 mLs Every 6 hours as needed.    Marland Kitchen LISDEXAMFETAMINE DIMESYLATE 70 MG PO CAPS Oral Take 70 mg by mouth every morning.    . IMODIUM PO Oral Take by mouth. Take 2 tablets after first loose stool, then 1 tablets after each additional loose stool. 8 tablets daily, maximum.    Marland Kitchen LORAZEPAM 0.5 MG PO TABS Oral Take 1 tablet (0.5 mg total) by mouth every 6 (six) hours as needed (Nausea or vomiting). 30 tablet 0  . MELOXICAM 15 MG PO TABS Oral Take 15 mg by mouth daily as needed.     Marland Kitchen OMEPRAZOLE 20 MG PO CPDR Oral Take 1 capsule (20 mg total) by mouth daily. 30 capsule 2  . ONDANSETRON 8 MG PO TBDP  8 mg Every 8 hours as needed.    . OXYCODONE HCL 5 MG/5ML PO SOLN Oral Take 5-10 mg by mouth every 4 (four) hours as needed. May increase to 10mg  if needed. Patient has been using every two hours because his throat pain is very severe    . POLYETHYLENE GLYCOL 3350 PO PACK Oral Take 17 g by mouth daily. 14 each   . PROCHLORPERAZINE MALEATE 10 MG PO TABS Oral Take 10 mg by mouth every 6 (six) hours as needed.    Marland Kitchen PROMETHAZINE HCL 25 MG RE SUPP Rectal Place 1 suppository (25 mg total) rectally 2 (two) times daily as needed for nausea. 24 each 0  . SERTRALINE HCL 100 MG PO TABS Oral Take 100 mg by mouth at bedtime.       BP 107/69  Pulse 112  Temp(Src) 99 F (37.2 C) (Oral)  Resp 16  Wt 201 lb (91.173 kg)  SpO2 97%  Physical Exam  Nursing note and vitals reviewed. Constitutional: He is oriented to person, place, and time. He appears well-developed and well-nourished. He appears distressed.       Uncomfortable appearing  HENT:  Head: Normocephalic.  Right Ear: External ear normal.  Left Ear: External ear normal.       Patient with erythema from mandible down to the scapular area of neck with mild excoriation noted to the right side of the neck -  oropharynx with mild trismus but appears to have thrush to posterior pharynx - dry mucous membranes  Eyes: Conjunctivae are normal. Pupils are equal, round, and reactive to light. No scleral icterus.  Neck:       See note  Cardiovascular: Regular rhythm and normal heart sounds.  Exam reveals no gallop and no friction rub.   No murmur heard.      tachycardia  Pulmonary/Chest: Effort normal and breath sounds normal. No respiratory distress. He has no wheezes. He has no rales. He exhibits no tenderness.  Abdominal: Soft. Bowel sounds are normal. He exhibits no distension and no mass. There is no tenderness. There is  no rebound and no guarding.  Musculoskeletal: Normal range of motion. He exhibits no edema and no tenderness.  Neurological: He is alert and oriented to person, place, and time. No cranial nerve deficit.  Skin: Skin is warm and dry. There is erythema.  Psychiatric: He has a normal mood and affect. His behavior is normal. Judgment and thought content normal.    ED Course  Procedures (including critical care time)  Labs Reviewed  CBC - Abnormal; Notable for the following:    WBC 1.1 (*)    RBC 3.40 (*)    Hemoglobin 10.4 (*)    HCT 30.8 (*)    Platelets 142 (*)    All other components within normal limits  DIFFERENTIAL - Abnormal; Notable for the following:    Lymphocytes Relative 11 (*)    Monocytes Relative 18 (*)    Neutro Abs 0.8 (*)    Lymphs Abs 0.1 (*)    All other components within normal limits  BASIC METABOLIC PANEL - Abnormal; Notable for the following:    Sodium 132 (*)    Chloride 95 (*)    Glucose, Bld 116 (*)    All other components within normal limits  URINALYSIS, ROUTINE W REFLEX MICROSCOPIC  CULTURE, BLOOD (ROUTINE X 2)  CULTURE, BLOOD (ROUTINE X 2)  PATHOLOGIST SMEAR REVIEW   Dg Chest 2 View  05/17/2011  *RADIOLOGY REPORT*  Clinical Data: Fever.  Right tonsillar cancer.  Ongoing chemotherapy.  Asthma.  CHEST - 2 VIEW  Comparison: 04/23/2011   Findings: Linear subsegmental atelectasis noted in the left lower lobe.  Mildly low lung volumes are present.  Cardiac and mediastinal contours appear unremarkable.  No pleural effusion noted.  The lungs are otherwise clear.  IMPRESSION:  1.  Linear subsegmental atelectasis in the left lower lobe. Otherwise, no significant abnormality identified.  Original Report Authenticated By: Dellia Cloud, M.D.   Results for orders placed during the hospital encounter of 05/17/11  CBC      Component Value Range   WBC 1.1 (*) 4.0 - 10.5 (K/uL)   RBC 3.40 (*) 4.22 - 5.81 (MIL/uL)   Hemoglobin 10.4 (*) 13.0 - 17.0 (g/dL)   HCT 11.9 (*) 14.7 - 52.0 (%)   MCV 90.6  78.0 - 100.0 (fL)   MCH 30.6  26.0 - 34.0 (pg)   MCHC 33.8  30.0 - 36.0 (g/dL)   RDW 82.9  56.2 - 13.0 (%)   Platelets 142 (*) 150 - 400 (K/uL)  DIFFERENTIAL      Component Value Range   Neutrophils Relative 69  43 - 77 (%)   Lymphocytes Relative 11 (*) 12 - 46 (%)   Monocytes Relative 18 (*) 3 - 12 (%)   Eosinophils Relative 2  0 - 5 (%)   Basophils Relative 0  0 - 1 (%)   Neutro Abs 0.8 (*) 1.7 - 7.7 (K/uL)   Lymphs Abs 0.1 (*) 0.7 - 4.0 (K/uL)   Monocytes Absolute 0.2  0.1 - 1.0 (K/uL)   Eosinophils Absolute 0.0  0.0 - 0.7 (K/uL)   Basophils Absolute 0.0  0.0 - 0.1 (K/uL)   WBC Morphology INCREASED BANDS (>20% BANDS)    BASIC METABOLIC PANEL      Component Value Range   Sodium 132 (*) 135 - 145 (mEq/L)   Potassium 3.8  3.5 - 5.1 (mEq/L)   Chloride 95 (*) 96 - 112 (mEq/L)   CO2 29  19 - 32 (mEq/L)   Glucose, Bld 116 (*)  70 - 99 (mg/dL)   BUN 11  6 - 23 (mg/dL)   Creatinine, Ser 1.61  0.50 - 1.35 (mg/dL)   Calcium 8.8  8.4 - 09.6 (mg/dL)   GFR calc non Af Amer >90  >90 (mL/min)   GFR calc Af Amer >90  >90 (mL/min)   Dg Chest 2 View  05/17/2011  *RADIOLOGY REPORT*  Clinical Data: Fever.  Right tonsillar cancer.  Ongoing chemotherapy.  Asthma.  CHEST - 2 VIEW  Comparison: 04/23/2011  Findings: Linear subsegmental atelectasis  noted in the left lower lobe.  Mildly low lung volumes are present.  Cardiac and mediastinal contours appear unremarkable.  No pleural effusion noted.  The lungs are otherwise clear.  IMPRESSION:  1.  Linear subsegmental atelectasis in the left lower lobe. Otherwise, no significant abnormality identified.  Original Report Authenticated By: Dellia Cloud, M.D.   Dg Chest 2 View  04/24/2011  *RADIOLOGY REPORT*  Clinical Data: Nausea, emesis, and diarrhea  CHEST - 2 VIEW  Comparison: None.  Findings: The heart, mediastinal, and hilar contours are normal. The lungs are well-expanded and clear. Negative for pleural effusion. The bony thorax is unremarkable.  IMPRESSION: No acute cardiopulmonary disease.  Original Report Authenticated By: Britta Mccreedy, M.D.   Dg Abd 1 View  05/03/2011  *RADIOLOGY REPORT*  Clinical Data: Evaluate for ileus  ABDOMEN - 1 VIEW  Comparison: 10/25/2011  Findings: Within the left upper quadrant of the abdomen and there is a gastrostomy tube.  The bowel gas pattern appears nonobstructed.  No dilated loops of small bowel or fluid levels noted.  Some colonic gas is noted.  Prior lower lumbar spine laminectomy and fusion noted.  IMPRESSION:  1.  Nonobstructive bowel gas pattern.  Original Report Authenticated By: Rosealee Albee, M.D.   CRITICAL CARE Performed by: Patrecia Pour.   Total critical care time: 30  Critical care time was exclusive of separately billable procedures and treating other patients.  Critical care was necessary to treat or prevent imminent or life-threatening deterioration.  Critical care was time spent personally by me on the following activities: development of treatment plan with patient and/or surrogate as well as nursing, discussions with consultants, evaluation of patient's response to treatment, examination of patient, obtaining history from patient or surrogate, ordering and performing treatments and interventions, ordering and review of  laboratory studies, ordering and review of radiographic studies, pulse oximetry and re-evaluation of patient's condition.   Neutropenia Fever of unknown origin Throat cancer Thrush   MDM  Patient with a history of tonsilar cancer presents 2 days after last chemo treatment and radiation treatment with acute onset of fever - noted to be neutropenic with labs and no definite source for the fever, I have spoken with Dr. Gaylyn Rong who recommends [facility] admission for this - patient is currently comfortable.       Izola Price [place], [place] 05/18/11 0104

## 2011-05-18 NOTE — H&P (Addendum)
PCP:  Pamelia Hoit, MD, MD   DOA:  05/17/2011  8:50 PM  Chief Complaint:  Fever, generalized weakness  HPI: 46 year old male with history of tonsillar squamous cell carcinoma (on concurrent chemotherapy and radiation therapy), anemia and neutropenia who presents to ED with complaints of generalized weakness of few days duration associated with fever (100.30F at home)  and chills. Patient reports having sore throat and right sided neck pain of same duration. Patient also reports having cough productive of yellowish to brownish sputum for past  2-3 weeks.  No associated neck stiffness,  no acute vision changes but patient does report headaches. Patient also reports chronic nausea and vomiting usually after chemiotherapy. No complaints of abdominal pain, blood in stool or urine. No lightheadedness, dizziness or loss of consciousness.  Assessment/Plan  Principal Problem:   *Neutropenic fever - secondary to immunosuppresion secondary to concurrent chemotherapy and radiation therapy - we will start empiric antibiotics, vancomycin and cefepime - CXR with left lower lobe atelectasis, urinalysis negative - follow up blood culture results - neutropenic precaution - Dr. Gaylyn Rong of oncology was notified by ED physician of admission  Active Problems:   Right tonsillar squamous cell carcinoma - Concurrent chemoradiation with q3wk Cisplatin 100mg /m2 and daily radiation; cisplatin was started on 04/07/11. - appreciate oncology following  Right side neck pain/Headache - will follow up on CT neck   Oral thrush - secondary to immunosuppression/ chemotherapy and radiation therapy - start fluconazole pre pharmacy   Nausea & vomiting - secondary to chemotherapy and radiation therapy - provide phenergan suppositories per home regimen - start zofran PRN IV    Anemia of chronic disease - secondary to history of tonsillar carcinoma - hemoglobin on admission is 10.4 - continue to monitor for now   Thrombocytopenia - secondary to chemotherapy - platelets slightly below normal range, 142 - no signs of active bleed - continue to monitor   Hyponatremia - perhaps due to dehydration versus SIADH syndrome/ paraneoplastic syndrome - check TSH - continue NS  125 cc/hr for next 12 hours - follow up BMP in am  DVT Prophylaxis - SCD bilaterally   Code Status - full code  Education  - test results and diagnostic studies were discussed with patient and pt's family who was present at the bedside - patient and family have verbalized the understanding - questions were answered at the bedside and contact information was provided for additional questions or concerns  CONSULTS: 1. ONCOLOGY: Dr. Gaylyn Rong  Antibiotics 1. Vancomycin 05/18/2011--> 2. Cefepime 05/18/2011--> 3. Fluconazole 05/18/2011-->   Allergies: Allergies  Allergen Reactions  . Contrast Media (Iodinated Diagnostic Agents) Anaphylaxis    Not recommended due to severe Iodine allergy.  . Iodine Anaphylaxis  . Shellfish Allergy Anaphylaxis  . Betadine (Povidone Iodine) Rash  . Avelox (Moxifloxacin Hcl In Nacl) Hives  . Morphine And Related Hives  . Prednisone Other (See Comments)    Altered mental status  . Ultram (Tramadol Hcl) Hives    Medication Sig Start Date End Date Taking?  Diphenhyd-Hydrocort-Nystatin SUSP Use as directed 15 mLs in the mouth or throat 4 (four) times daily as needed (Swish, gargle and spit as needed for mouth sores). 04/17/11  Yes  feeding supplement (ENSURE COMPLETE) LIQD Take 237 mLs by mouth 6 (six) times daily. 05/07/11  Yes  fentaNYL (DURAGESIC - DOSED MCG/HR) 25 MCG/HR Place 1 patch (25 mcg total) onto the skin every 3 (three) days. 04/27/11 05/27/11 Yes  HYDROcodone-acetaminophen (LORTAB) 7.5-500 MG/15ML solution 15 mLs Every  6 hours as needed. 04/24/11  Yes  lisdexamfetamine (VYVANSE) 70 MG capsule Take 70 mg by mouth every morning.   Yes  Loperamide HCl (IMODIUM PO) Take by mouth. Take 2 tablets  after first loose stool, then 1 tablets after each additional loose stool. 8 tablets daily, maximum.   Yes  LORazepam (ATIVAN) 0.5 MG tablet Take 1 tablet (0.5 mg total) by mouth every 6 (six) hours as needed (Nausea or vomiting). 05/08/11 11/04/11 Yes  meloxicam (MOBIC) 15 MG tablet Take 15 mg by mouth daily as needed.  03/31/11  Yes  omeprazole (PRILOSEC) 20 MG capsule Take 1 capsule (20 mg total) by mouth daily. 04/24/11 04/23/12 Yes  oxyCODONE (ROXICODONE) 5 MG/5ML solution Take 5-10 mg by mouth every 4 (four) hours as needed. May increase to 10mg  if needed. Patient has been using every two hours because his throat pain is very severe   Yes  polyethylene glycol (MIRALAX / GLYCOLAX) packet Take 17 g by mouth daily. 05/07/11 06/09/12 Yes  prochlorperazine (COMPAZINE) 10 MG tablet Take 10 mg by mouth every 6 (six) hours as needed.   Yes  promethazine (PHENERGAN) 25 MG suppository Place 1 suppository (25 mg total) rectally 2 (two) times daily as needed for nausea. 05/01/11 06/16/11 Yes  sertraline (ZOLOFT) 100 MG tablet Take 100 mg by mouth at bedtime.    Yes    Past Medical History  Diagnosis Date  . Nasal polyps   . 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 Depression   . Fatigue   . Degeneration of lumbar intervertebral disc   . Allergy     seasonal  . Asthma     seasonal  . 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 TMJ (dislocation of temporomandibular joint)   . Sleep apnea     Past Surgical History  Procedure Date  . Anterior release vertebral body w/ posterior fusion     lumbar  . Back surgery 09/1999    L4-5 fusion  . Multiple tooth extractions March 2013    6 teeth removed  . Peg placement March 2013    Placed at [place] [facility]  . Panendoscopy 03/18/2011    Procedure: PANENDOSCOPY;  Surgeon: Flo Shanks, MD;  Location: [facility] OR;  Service: ENT;  Laterality: N/A;  PANDENDONOSCOPY  . Tonsillectomy 03/18/2011    Procedure: TONSILLECTOMY;  Surgeon:  Flo Shanks, MD;  Location: [facility] OR;  Service: ENT;  Laterality: Right;    Social History:  reports that he has never smoked. He quit smokeless tobacco use about 12 years ago. His smokeless tobacco use included Chew. He reports that he drinks alcohol. He reports that he does not use illicit drugs.  Family History  Problem Relation Age of Onset  . Diabetes Mother   . Hypertension Mother   . Stroke Maternal Grandfather     Review of Systems:  Constitutional: (+) fever, chills, (-)diaphoresis, appetite change and fatigue.  HEENT: Denies photophobia, eye pain, redness, hearing loss, ear pain, congestion, sore throat, rhinorrhea, sneezing,  Respiratory: Denies SOB, DOE, cough, chest tightness,  and wheezing.   Cardiovascular: Denies chest pain, palpitations and leg swelling.  Gastrointestinal: reports chronic  nausea, vomiting, (-) abdominal pain, diarrhea, constipation, blood in stool and abdominal distention.  Genitourinary: Denies dysuria, urgency, frequency, hematuria, flank pain and difficulty urinating.  Musculoskeletal: Denies myalgias, back pain, joint swelling, arthralgias and gait problem.  Skin: Denies pallor, rash and wound.  Neurological: Denies dizziness, seizures, syncope, (+) weakness, no  light-headedness, numbness and headaches.  Hematological: Denies adenopathy. Easy bruising, personal or family bleeding history  Psychiatric/Behavioral: Denies suicidal ideation, mood changes, confusion, nervousness, sleep disturbance and agitation   Physical Exam:  Filed Vitals:   05/17/11 2055  BP: 107/69  Pulse: 112  Temp: 99 F (37.2 C)  TempSrc: Oral  Resp: 16  Weight: 91.173 kg (201 lb)  SpO2: 97%    Constitutional: Vital signs reviewed.  Patient is in no acute distress and cooperative with exam. Alert and oriented x3.  Head: Normocephalic and atraumatic Ear: TM normal bilaterally Mouth: no erythema ; present oral thrush Eyes: PERRL, EOMI, conjunctivae normal, No scleral  icterus.  Neck: Supple, Trachea midline normal ROM, No JVD, mass, thyromegaly, or carotid bruit present.  Cardiovascular: RRR, S1 normal, S2 normal, no MRG, pulses symmetric and intact bilaterally Pulmonary/Chest: CTAB, no wheezes, rales, or rhonchi Abdominal: Soft. Non-tender, non-distended, bowel sounds are normal, no masses, organomegaly, or guarding present; peg tube in place GU: no CVA tenderness Musculoskeletal: No joint deformities, erythema, or stiffness, ROM full and no nontender Ext: no edema and no cyanosis, pulses palpable bilaterally (DP and PT) Hematology: no cervical, inginal, or axillary adenopathy.  Neurological: A&O x3, Strenght is normal and symmetric bilaterally, cranial nerve II-XII are grossly intact, no focal motor deficit, sensory intact to light touch bilaterally.  Skin: erythema from mandibular area to clavicles Psychiatric: Normal mood and affect. speech and behavior is normal. Judgment and thought content normal. Cognition and memory are normal.   Labs on Admission:  CBC     Status: Abnormal   Collection Time   05/17/11  9:49 PM      Component Value Range Comment   WBC 1.1 (*) 4.0 - 10.5 (K/uL)    RBC 3.40 (*) 4.22 - 5.81 (MIL/uL)    Hemoglobin 10.4 (*) 13.0 - 17.0 (g/dL)    HCT 69.6 (*) 29.5 - 52.0 (%)    MCV 90.6  78.0 - 100.0 (fL)    Platelets 142 (*) 150 - 400 (K/uL)   DIFFERENTIAL     Status: Abnormal   05/17/11  9:49 PM      Component Value Range Comment   Neutrophils Relative 69  43 - 77 (%)    Lymphocytes Relative 11 (*) 12 - 46 (%)    Monocytes Relative 18 (*) 3 - 12 (%)    Neutro Abs 0.8 (*) 1.7 - 7.7 (K/uL)    Lymphs Abs 0.1 (*) 0.7 - 4.0 (K/uL)    WBC Morphology INCR. BANDS (>20% BANDS)     BASIC METABOLIC PANEL     Status: Abnormal   05/17/11  9:49 PM      Component Value Range Comment   Sodium 132 (*) 135 - 145 (mEq/L)    Potassium 3.8  3.5 - 5.1 (mEq/L)    Chloride 95 (*) 96 - 112 (mEq/L)    CO2 29  19 - 32 (mEq/L)    Glucose, Bld 116  (*) 70 - 99 (mg/dL)    BUN 11  6 - 23 (mg/dL)    Creatinine, Ser 2.84  0.50 - 1.35 (mg/dL)    Calcium 8.8  8.4 - 10.5 (mg/dL)    GFR calc non Af Amer >90  >90 (mL/min)    GFR calc Af Amer >90  >90 (mL/min)   URINALYSIS, ROUTINE W REFLEX MICROSCOPIC     Status: Normal   05/17/11 11:39 PM      Component Value Range Comment   Color, Urine YELLOW  YELLOW     APPearance CLEAR  CLEAR     Specific Gravity, Urine 1.024  1.005 - 1.030     pH 6.0  5.0 - 8.0     Glucose, UA NEGATIVE  NEGATIVE (mg/dL)    Hgb urine dipstick NEGATIVE  NEGATIVE     Bilirubin Urine NEGATIVE  NEGATIVE     Ketones, ur NEGATIVE  NEGATIVE (mg/dL)    Protein, ur NEGATIVE  NEGATIVE (mg/dL)    Urobilinogen, UA 1.0  0.0 - 1.0 (mg/dL)    Nitrite NEGATIVE  NEGATIVE     Leukocytes, UA NEGATIVE  NEGATIVE      Radiological Exams on Admission: CXR: IMPRESSION: 1. Linear subsegmental atelectasis in the left lower lobe. Otherwise, no significant abnormality identified.  Time Spent on Admission: Over 30 minutes  Kellyann Ordway 05/18/2011, 12:56 AM  [facility] Pager # [phone] Main Office # [phone]

## 2011-05-18 NOTE — Progress Notes (Signed)
UR completed 

## 2011-05-18 NOTE — Progress Notes (Signed)
ANTIBIOTIC CONSULT NOTE - INITIAL  Pharmacy Consult for Cefepime/Vancomycin/Fluconazole Indication: Neutropenic fever  Allergies  Allergen Reactions  . Contrast Media (Iodinated Diagnostic Agents) Anaphylaxis    Not recommended due to severe Iodine allergy.  . Iodine Anaphylaxis  . Shellfish Allergy Anaphylaxis  . Betadine (Povidone Iodine) Rash  . Avelox (Moxifloxacin Hcl In Nacl) Hives  . Morphine And Related Hives  . Prednisone Other (See Comments)    Altered mental status  . Ultram (Tramadol Hcl) Hives    Patient Measurements: Weight: 201 lb (91.173 kg)   Vital Signs: Temp: 99.5 F (37.5 C) (05/13 0209) Temp src: Oral (05/13 0209) BP: 107/69 mmHg (05/12 2055) Pulse Rate: 112  (05/12 2055) Intake/Output from previous day:   Intake/Output from this shift:    Labs:  Basename 05/17/11 2149 05/15/11 0838 05/15/11 0838  WBC 1.1* -- 1.9*  HGB 10.4* -- 11.7*  PLT 142* -- 139*  LABCREA -- -- --  CREATININE 0.72 0.85 --   The CrCl is unknown because both a height and weight (above a minimum accepted value) are required for this calculation. No results found for this basename: VANCOTROUGH:2,VANCOPEAK:2,VANCORANDOM:2,GENTTROUGH:2,GENTPEAK:2,GENTRANDOM:2,TOBRATROUGH:2,TOBRAPEAK:2,TOBRARND:2,AMIKACINPEAK:2,AMIKACINTROU:2,AMIKACIN:2, in the last 72 hours   Microbiology: No results found for this or any previous visit (from the past 720 hour(s)).  Medical History: Past Medical History  Diagnosis Date  . Nasal polyps   . 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 Depression   . Fatigue   . Degeneration of lumbar intervertebral disc   . Allergy     seasonal  . Asthma     seasonal  . 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 TMJ (dislocation of temporomandibular joint)   . Sleep apnea     Medications:  Scheduled:    . ceFEPime (MAXIPIME) IV  2 g Intravenous Once  . ceFEPime (MAXIPIME) IV  2 g Intravenous Q8H  . feeding  supplement  237 mL Oral 6 X Daily  . fentaNYL  25 mcg Transdermal Q72H  . fluconazole (DIFLUCAN) IV  100 mg Intravenous Q24H  .  HYDROmorphone (DILAUDID) injection  1 mg Intravenous Once  .  HYDROmorphone (DILAUDID) injection  1 mg Intravenous Once  . ondansetron (ZOFRAN) IV  4 mg Intravenous Once  . ondansetron (ZOFRAN) IV  4 mg Intravenous Once  . pantoprazole (PROTONIX) IV  40 mg Intravenous Q24H  . polyethylene glycol  17 g Oral Daily  . sertraline  100 mg Oral QHS  . sodium chloride  1,000 mL Intravenous Once  . vancomycin  1,000 mg Intravenous Once  . vancomycin  1,000 mg Intravenous Q8H   Infusions:    . sodium chloride     Assessment: 46 yo male with history of tonsillar squamous cell carcinoma currently receiving chemo and radiation presents today with anemia and febrile neutropenia.  Goal of Therapy:  Vancomycin trough level 15-20 mcg/ml  Plan:   Vancomycin 1Gm IV q8h.  CrCl~ 133 (N)  Cefepime 2Gm IV q8h.  Diflucan 100mg  IV q24h. (for oral thrush)  Susanne Greenhouse R 05/18/2011,2:36 AM

## 2011-05-18 NOTE — Progress Notes (Signed)
INITIAL ADULT NUTRITION ASSESSMENT Date: 05/18/2011   Time: 3:53 PM Reason for Assessment: MD consult- pt with >10# unintentional weight loss in the past month; Nutrition Risk-dysphagia, weight loss, home TF  ASSESSMENT: Male 46 y.o.  Dx: Neutropenic fever, severe mucositis/xerostomia, mild nausea  Hx:  Past Medical History  Diagnosis Date  . Nasal polyps   . 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 Depression   . Fatigue   . Degeneration of lumbar intervertebral disc   . Allergy     seasonal  . Asthma     seasonal  . 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 TMJ (dislocation of temporomandibular joint)   . Sleep apnea    Past Surgical History  Procedure Date  . Anterior release vertebral body w/ posterior fusion     lumbar  . Back surgery 09/1999    L4-5 fusion  . Multiple tooth extractions March 2013    6 teeth removed  . Peg placement March 2013    Placed at [facility]  . Panendoscopy 03/18/2011    Procedure: PANENDOSCOPY;  Surgeon: Flo Shanks, MD;  Location: [facility] OR;  Service: ENT;  Laterality: N/A;  PANDENDONOSCOPY  . Tonsillectomy 03/18/2011    Procedure: TONSILLECTOMY;  Surgeon: Flo Shanks, MD;  Location: [facility] OR;  Service: ENT;  Laterality: Right;     Related Meds:  Scheduled Meds:   . ceFEPime (MAXIPIME) IV  2 g Intravenous Once  . ceFEPime (MAXIPIME) IV  2 g Intravenous Q8H  . feeding supplement  237 mL Oral 6 X Daily  . fentaNYL  50 mcg Transdermal Q72H  . fluconazole (DIFLUCAN) IV  100 mg Intravenous Q24H  .  HYDROmorphone (DILAUDID) injection  1 mg Intravenous Once  .  HYDROmorphone (DILAUDID) injection  1 mg Intravenous Once  . ondansetron (ZOFRAN) IV  4 mg Intravenous Once  . ondansetron (ZOFRAN) IV  4 mg Intravenous Once  . pantoprazole (PROTONIX) IV  40 mg Intravenous Q24H  . sertraline  100 mg Oral QHS  . sodium chloride  1,000 mL Intravenous Once  . vancomycin  1,000 mg Intravenous Once  . vancomycin   1,000 mg Intravenous Q8H  . DISCONTD: fentaNYL  25 mcg Transdermal Q72H  . DISCONTD: polyethylene glycol  17 g Oral Daily   Continuous Infusions:   . sodium chloride 75 mL/hr at 05/18/11 0243   PRN Meds:.butalbital-acetaminophen-caffeine, guaifenesin, HYDROmorphone (DILAUDID) injection, HYDROmorphone HCl, LORazepam, magic mouthwash w/lidocaine, magic mouthwash, polyethylene glycol, prochlorperazine, promethazine, DISCONTD: Diphenhyd-Hydrocort-Nystatin, DISCONTD: HYDROcodone-acetaminophen, DISCONTD:  HYDROmorphone (DILAUDID) injection, DISCONTD: ondansetron (ZOFRAN) IV, DISCONTD: ondansetron, DISCONTD: oxyCODONE, DISCONTD: promethazine  Ht: 5\' 8"  (172.7 cm)  Wt: 204 lb 9.4 oz (92.8 kg)  Ideal Wt: 70 % Ideal Wt: 133  Usual Wt: 236# 02/2011 when dx Wt Readings from Last 15 Encounters:  05/18/11 204 lb 9.4 oz (92.8 kg)  05/14/11 201 lb 3.2 oz (91.264 kg)  05/11/11 201 lb 4.8 oz (91.309 kg)  05/02/11 210 lb 6.4 oz (95.437 kg)  05/07/11 205 lb 14.4 oz (93.396 kg)  04/30/11 214 lb (97.07 kg)  04/27/11 212 lb 14.4 oz (96.571 kg)  04/23/11 210 lb 11.2 oz (95.573 kg)  04/20/11 214 lb 1.6 oz (97.115 kg)  04/16/11 212 lb 8 oz (96.389 kg)  04/15/11 212 lb 9.6 oz (96.435 kg)  04/13/11 214 lb 12.8 oz (97.433 kg)  04/07/11 233 lb 9.6 oz (105.96 kg)  03/25/11 226 lb 3.2 oz (102.604 kg)  03/24/11 232 lb (105.235 kg)  %  Usual Wt: 96 from 1 month ago, 88% from 2 months ago, 86% from 3 months ago when dx  Body mass index is 31.11 kg/(m^2).  Food/Nutrition Related Hx: Pt seen by Oncology RD 05/15/11.  Pt was having severe N/V and was receiving here receiving fluids.  Pt was taking 6 cans Ensure Plus daily through his feeding tube in addition to 1 scoop of protein powder.  Eating 2 meals a day un until approximately 04/13/11.  Pt educated to increase Ensure plus to 8 cans/day and continue adding protein powder daily.  Pt reports that since that visit he was only able to take 4-5 cans Ensure Plus daily  and was not taking protein powder.  Labs:  CMP     Component Value Date/Time   NA 130* 05/18/2011 0425   K 3.8 05/18/2011 0425   CL 94* 05/18/2011 0425   CO2 27 05/18/2011 0425   GLUCOSE 105* 05/18/2011 0425   BUN 10 05/18/2011 0425   CREATININE 0.70 05/18/2011 0425   CALCIUM 8.5 05/18/2011 0425   PROT 6.4 05/18/2011 0425   ALBUMIN 3.0* 05/18/2011 0425   AST 11 05/18/2011 0425   ALT 17 05/18/2011 0425   ALKPHOS 54 05/18/2011 0425   BILITOT 0.4 05/18/2011 0425   GFRNONAA >90 05/18/2011 0425   GFRAA >90 05/18/2011 0425    I/O last 3 completed shifts: In: 505 [I.V.:255; IV Piggyback:250] Out: 300 [Urine:300] Total I/O In: 1782.3 [P.O.:480; I.V.:591.3; Other:711] Out: 1201 [Urine:1200; Emesis/NG output:1]   Diet Order: Regular  Supplements/Tube Feeding: 6 cans Ensure Complete Daily  IVF:    sodium chloride Last Rate: 75 mL/hr at 05/18/11 0243    Estimated Nutritional Needs:per day   Kcal: 2300-2600 Protein: 120-135 g Fluid: >2.7L  Pt meets criteria for Severe Malnutrition in the context of acute on chronic illness related to N/V and increased needs AEB weight loss of 14% in the past 3 months, 3% loss in the last 2 weeks. And <75% intake in the past week.  Currently unable to take po secondary to mucositis. Tolerating PEG feeds but not yet up to 6 cans per day.  Ensure Complete contains Revigor with recommendations to limit to 2 cans daily.  Pt still wishes to continue 2 cans of Ensure Complete.  I will change the other 4 cans to Osmolite 1.5 per PEG.  6 cans will provide 2100 kcal, 78 g protein, 1081 ml free water daily.  Pt also talked about adding soup down his PEG tube.  He had been doing this at home in the past.  Oncology RD had discussed the risks of this with the pt.  Pt reported feeling better when he did this.  Currently discussed concerns for introducing bacteria into his gut.  Pt verbalized understanding that this would not be advised.  Pt meeting free water needs with PEG  feedings, IVF, and free water flushes before and after TF.    NUTRITION DIAGNOSIS: -Inadequate oral intake (NI-2.1).  Status: Ongoing  RELATED TO: inability to eat secondary to mucositis  AS EVIDENCE BY: pt report  MONITORING/EVALUATION(Goals): Monitor:  TF tolerance, weight, labs Goal:  Meet estimated needs with PEG feedings to prevent further weight loss.  EDUCATION NEEDS: -No education needs identified at this time  INTERVENTION:  Will Continue Ensure Complete 2 cans daily and Osmolite 1.5 for the remaining 4 cans per day.  (Per recommendations from manufacture to limit revigor to 2 cans daily and pt's request to continue some of this product.)  2100 kcal,  78 g protein  Will add Prostat tid to provide an additional 45 g protein, 216 kcal  HOME Recs:  8 Cans Ensure Plus daily, 1 packet of Unjury Protein supplement daily to provide 2900 kcal, 124 g protein, 1441 ml free water.  Pt needs an additional 1500 ml free water daily via flushes to meet estimated fluid requirements.  Home recs had been increased per the Oncology RD secondary to continued weight loss.  Dietitian [phone]  DOCUMENTATION CODES Per approved criteria  -Severe malnutrition in the context of acute on chronic illness -Obesity grade 1    Kenitha Glendinning, Anastasia Fiedler 05/18/2011, 3:53 PM
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
## 2011-05-19 ENCOUNTER — Telehealth: Payer: Self-pay | Admitting: *Deleted

## 2011-05-19 ENCOUNTER — Encounter: Payer: Self-pay | Admitting: *Deleted

## 2011-05-19 ENCOUNTER — Ambulatory Visit: Payer: Self-pay

## 2011-05-19 ENCOUNTER — Ambulatory Visit: Payer: BC Managed Care – PPO

## 2011-05-19 ENCOUNTER — Encounter: Payer: Self-pay | Admitting: Nutrition

## 2011-05-19 ENCOUNTER — Other Ambulatory Visit: Payer: Self-pay | Admitting: Oncology

## 2011-05-19 DIAGNOSIS — K121 Other forms of stomatitis: Secondary | ICD-10-CM

## 2011-05-19 DIAGNOSIS — K1230 Oral mucositis (ulcerative), unspecified: Secondary | ICD-10-CM

## 2011-05-19 DIAGNOSIS — D649 Anemia, unspecified: Secondary | ICD-10-CM

## 2011-05-19 DIAGNOSIS — R5081 Fever presenting with conditions classified elsewhere: Secondary | ICD-10-CM

## 2011-05-19 DIAGNOSIS — D696 Thrombocytopenia, unspecified: Secondary | ICD-10-CM

## 2011-05-19 DIAGNOSIS — D709 Neutropenia, unspecified: Secondary | ICD-10-CM

## 2011-05-19 LAB — DIFFERENTIAL
Basophils Relative: 0 % (ref 0–1)
Eosinophils Relative: 0 % (ref 0–5)
Lymphs Abs: 0 10*3/uL — ABNORMAL LOW (ref 0.7–4.0)
Monocytes Absolute: 0 10*3/uL — ABNORMAL LOW (ref 0.1–1.0)
Neutrophils Relative %: 0 % — ABNORMAL LOW (ref 43–77)
nRBC: 0 /100 WBC

## 2011-05-19 LAB — CBC
MCH: 30.9 pg (ref 26.0–34.0)
MCHC: 34.1 g/dL (ref 30.0–36.0)
MCV: 90.5 fL (ref 78.0–100.0)
Platelets: 149 10*3/uL — ABNORMAL LOW (ref 150–400)
RBC: 2.85 MIL/uL — ABNORMAL LOW (ref 4.22–5.81)

## 2011-05-19 LAB — GLUCOSE, CAPILLARY: Glucose-Capillary: 88 mg/dL (ref 70–99)

## 2011-05-19 LAB — BASIC METABOLIC PANEL
CO2: 29 mEq/L (ref 19–32)
Calcium: 8.1 mg/dL — ABNORMAL LOW (ref 8.4–10.5)
Creatinine, Ser: 0.72 mg/dL (ref 0.50–1.35)
GFR calc non Af Amer: >90 mL/min (ref >90)
Glucose, Bld: 108 mg/dL — ABNORMAL HIGH (ref 70–99)
Sodium: 133 mEq/L — ABNORMAL LOW (ref 135–145)

## 2011-05-19 MED ORDER — ONDANSETRON HCL 4 MG/2ML IJ SOLN
INTRAMUSCULAR | Status: AC
  Administered 2011-05-19: 4 mg via INTRAMUSCULAR
  Filled 2011-05-19: qty 2

## 2011-05-19 MED ORDER — HYDROXYZINE HCL 25 MG PO TABS
25.0000 mg | ORAL_TABLET | Freq: Three times a day (TID) | ORAL | Status: DC | PRN
  Filled 2011-05-19: qty 1

## 2011-05-19 MED ORDER — VANCOMYCIN HCL 1000 MG IV SOLR
1250.0000 mg | Freq: Three times a day (TID) | INTRAVENOUS | Status: DC
  Administered 2011-05-19 – 2011-05-20 (×2): 1250 mg via INTRAVENOUS
  Filled 2011-05-19 (×3): qty 1250

## 2011-05-19 MED ORDER — VANCOMYCIN HCL 1000 MG IV SOLR
1250.0000 mg | Freq: Three times a day (TID) | INTRAVENOUS | Status: DC
  Filled 2011-05-19: qty 1250

## 2011-05-19 NOTE — Progress Notes (Signed)
[facility] INPATIENT PROGRESS NOTE  Name: Kyle Hendrix      MRN: [id number]    Location: 1606/1606-01  Date: 05/19/2011 Time:8:22 AM   Subjective: Interval History:Jerman M Calzada did not have radiation yesterday per Rad Onc's decision from I presumed mucositis.  Patient himself is ready to resume radiation today in order to finish this week.  He was seen by Dietician yesterday with recommendation for Osmolyte/Ensure Plus mix with Prostat.  He did not tolerate Osmolyte and Prostat due to increased nausea and vomiting.  For his severe mucositis pain, he had his fentanyl patch increased from to yesterday.  He has had some relieved with Dilaudid elixir; but still needed IV Dilaudid in between.  He has skin itching in the neck and also some in the arms and back.  His PEG tube is still slightly erythematous and some phlegm but no abdominal pain or resistance with feeding.  He has not had fever, SOB, chest pain, bleeding symptoms, dysuria, pyuria.   Objective: Vital signs in last 24 hours: Temp:  [98.1 F (36.7 C)-99.1 F (37.3 C)] 98.1 F (36.7 C) (05/14 0631) Pulse Rate:  [96-105] 96  (05/14 0631) Resp:  [18] 18  (05/14 0631) BP: (106-128)/(67-80) 106/67 mmHg (05/14 0631) SpO2:  [96 %-99 %] 96 % (05/14 0631)      PHYSICAL EXAM:  Gen: Well-nourished man, tired-appearing. Eyes: No scleral icterus or jaundice. ENT: There was no oropharyngeal mass but positive for erythema and oral thrush on the soft palate which is slightly improved since admission. Neck was supple without thyromegaly. Lymphatics: Negative for cervical, supraclavicular, axillary, or inguinal adenopathy. Skin in the neck was red but without opened wound. Respiratory: Lungs were clear bilaterally without wheezing or crackles. Cardiovascular: normal heart rate and rhythm; S1/S2; without murmur, rubs, or gallop. There was no pedal edema. GI: Abdomen was soft, flat, nontender, nondistended, without  organomegaly. PEG tube showed stable erythema and positive for purulent discharge without pain on palpation.  Musculoskeletal exam: No spinal tenderness on palpation of vertebral spine. Skin exam was without ecchymosis, petechiae.  There was no diffuse rash on the back or arms.  Neuro exam was nonfocal. Patient was alert and oriented. Attention was good. Language was appropriate. Mood was normal without depression. Speech was not pressured. Thought content was not tangential.    Studies/Results: Results for orders placed during the hospital encounter of 05/17/11 (from the past 48 hour(s))  CBC     Status: Abnormal   Collection Time   05/17/11  9:49 PM      Component Value Range Comment   WBC 1.1 (*) 4.0 - 10.5 (K/uL)    RBC 3.40 (*) 4.22 - 5.81 (MIL/uL)    Hemoglobin 10.4 (*) 13.0 - 17.0 (g/dL)    HCT 69.6 (*) 29.5 - 52.0 (%)    MCV 90.6  78.0 - 100.0 (fL)    MCH 30.6  26.0 - 34.0 (pg)    MCHC 33.8  30.0 - 36.0 (g/dL)    RDW 28.4  13.2 - 44.0 (%)    Platelets 142 (*) 150 - 400 (K/uL)   DIFFERENTIAL     Status: Abnormal   Collection Time   05/17/11  9:49 PM      Component Value Range Comment   Neutrophils Relative 69  43 - 77 (%)    Lymphocytes Relative 11 (*) 12 - 46 (%)    Monocytes Relative 18 (*) 3 - 12 (%)    Eosinophils  Relative 2  0 - 5 (%)    Basophils Relative 0  0 - 1 (%)    Neutro Abs 0.8 (*) 1.7 - 7.7 (K/uL)    Lymphs Abs 0.1 (*) 0.7 - 4.0 (K/uL)    Monocytes Absolute 0.2  0.1 - 1.0 (K/uL)    Eosinophils Absolute 0.0  0.0 - 0.7 (K/uL)    Basophils Absolute 0.0  0.0 - 0.1 (K/uL)    WBC Morphology INCREASED BANDS (>20% BANDS)     BASIC METABOLIC PANEL     Status: Abnormal   Collection Time   05/17/11  9:49 PM      Component Value Range Comment   Sodium 132 (*) 135 - 145 (mEq/L)    Potassium 3.8  3.5 - 5.1 (mEq/L)    Chloride 95 (*) 96 - 112 (mEq/L)    CO2 29  19 - 32 (mEq/L)    Glucose, Bld 116 (*) 70 - 99 (mg/dL)    BUN 11  6 - 23 (mg/dL)    Creatinine, Ser 4.09   0.50 - 1.35 (mg/dL)    Calcium 8.8  8.4 - 10.5 (mg/dL)    GFR calc non Af Amer >90  >90 (mL/min)    GFR calc Af Amer >90  >90 (mL/min)   PATHOLOGIST SMEAR REVIEW     Status: Normal   Collection Time   05/17/11  9:49 PM      Component Value Range Comment   Tech Review Reviewed by H. Hollice Espy, M.D.     URINALYSIS, ROUTINE W REFLEX MICROSCOPIC     Status: Normal   Collection Time   05/17/11 11:39 PM      Component Value Range Comment   Color, Urine YELLOW  YELLOW     APPearance CLEAR  CLEAR     Specific Gravity, Urine 1.024  1.005 - 1.030     pH 6.0  5.0 - 8.0     Glucose, UA NEGATIVE  NEGATIVE (mg/dL)    Hgb urine dipstick NEGATIVE  NEGATIVE     Bilirubin Urine NEGATIVE  NEGATIVE     Ketones, ur NEGATIVE  NEGATIVE (mg/dL)    Protein, ur NEGATIVE  NEGATIVE (mg/dL)    Urobilinogen, UA 1.0  0.0 - 1.0 (mg/dL)    Nitrite NEGATIVE  NEGATIVE     Leukocytes, UA NEGATIVE  NEGATIVE  MICROSCOPIC NOT DONE ON URINES WITH NEGATIVE PROTEIN, BLOOD, LEUKOCYTES, NITRITE, OR GLUCOSE <1000 mg/dL.  COMPREHENSIVE METABOLIC PANEL     Status: Abnormal   Collection Time   05/18/11  4:25 AM      Component Value Range Comment   Sodium 130 (*) 135 - 145 (mEq/L)    Potassium 3.8  3.5 - 5.1 (mEq/L)    Chloride 94 (*) 96 - 112 (mEq/L)    CO2 27  19 - 32 (mEq/L)    Glucose, Bld 105 (*) 70 - 99 (mg/dL)    BUN 10  6 - 23 (mg/dL)    Creatinine, Ser 8.11  0.50 - 1.35 (mg/dL)    Calcium 8.5  8.4 - 10.5 (mg/dL)    Total Protein 6.4  6.0 - 8.3 (g/dL)    Albumin 3.0 (*) 3.5 - 5.2 (g/dL)    AST 11  0 - 37 (U/L)    ALT 17  0 - 53 (U/L)    Alkaline Phosphatase 54  39 - 117 (U/L)    Total Bilirubin 0.4  0.3 - 1.2 (mg/dL)    GFR calc non Af Amer >  90  >90 (mL/min)    GFR calc Af Amer >90  >90 (mL/min)   CBC     Status: Abnormal   Collection Time   05/18/11  4:25 AM      Component Value Range Comment   WBC 0.9 (*) 4.0 - 10.5 (K/uL)    RBC 3.06 (*) 4.22 - 5.81 (MIL/uL)    Hemoglobin 9.4 (*) 13.0 - 17.0 (g/dL)     HCT 40.9 (*) 81.1 - 52.0 (%)    MCV 90.2  78.0 - 100.0 (fL)    MCH 30.7  26.0 - 34.0 (pg)    MCHC 34.1  30.0 - 36.0 (g/dL)    RDW 91.4  78.2 - 95.6 (%)    Platelets 139 (*) 150 - 400 (K/uL)   DIFFERENTIAL     Status: Abnormal   Collection Time   05/18/11  4:25 AM      Component Value Range Comment   Neutrophils Relative 0 (*) 43 - 77 (%)    Lymphocytes Relative 0 (*) 12 - 46 (%)    Monocytes Relative 0 (*) 3 - 12 (%)    Eosinophils Relative 0  0 - 5 (%)    Basophils Relative 0  0 - 1 (%)    Band Neutrophils 0  0 - 10 (%)    nRBC 0  0 (/100 WBC)    Lymphs Abs 0.0 (*) 0.7 - 4.0 (K/uL)    Monocytes Absolute 0.0 (*) 0.1 - 1.0 (K/uL)    Eosinophils Absolute 0.0  0.0 - 0.7 (K/uL)    Basophils Absolute 0.0  0.0 - 0.1 (K/uL)    WBC Morphology TOO FEW TO COUNT, SMEAR AVAILABLE FOR REVIEW      Smear Review PLATELET COUNT CONFIRMED BY SMEAR   PLATELET CLUMPS NOTED ON SMEAR  APTT     Status: Normal   Collection Time   05/18/11  4:25 AM      Component Value Range Comment   aPTT 33  24 - 37 (seconds)   HEMOGLOBIN A1C     Status: Abnormal   Collection Time   05/18/11  4:25 AM      Component Value Range Comment   Hemoglobin A1C 5.8 (*) <5.7 (%)    Mean Plasma Glucose 120 (*) <117 (mg/dL)   MAGNESIUM     Status: Normal   Collection Time   05/18/11  4:25 AM      Component Value Range Comment   Magnesium 2.1  1.5 - 2.5 (mg/dL)   PHOSPHORUS     Status: Normal   Collection Time   05/18/11  4:25 AM      Component Value Range Comment   Phosphorus 2.5  2.3 - 4.6 (mg/dL)   PROTIME-INR     Status: Normal   Collection Time   05/18/11  4:25 AM      Component Value Range Comment   Prothrombin Time 13.9  11.6 - 15.2 (seconds)    INR 1.05  0.00 - 1.49    TSH     Status: Normal   Collection Time   05/18/11  4:25 AM      Component Value Range Comment   TSH 0.515  0.350 - 4.500 (uIU/mL)   GLUCOSE, CAPILLARY     Status: Normal   Collection Time   05/18/11  7:21 AM      Component Value Range Comment    Glucose-Capillary 95  70 - 99 (mg/dL)   CBC     Status:  Abnormal   Collection Time   05/19/11  4:20 AM      Component Value Range Comment   WBC 1.0 (*) 4.0 - 10.5 (K/uL)    RBC 2.85 (*) 4.22 - 5.81 (MIL/uL)    Hemoglobin 8.8 (*) 13.0 - 17.0 (g/dL)    HCT 16.1 (*) 09.6 - 52.0 (%)    MCV 90.5  78.0 - 100.0 (fL)    MCH 30.9  26.0 - 34.0 (pg)    MCHC 34.1  30.0 - 36.0 (g/dL)    RDW 04.5  40.9 - 81.1 (%)    Platelets 149 (*) 150 - 400 (K/uL)   BASIC METABOLIC PANEL     Status: Abnormal   Collection Time   05/19/11  4:20 AM      Component Value Range Comment   Sodium 133 (*) 135 - 145 (mEq/L)    Potassium 3.8  3.5 - 5.1 (mEq/L)    Chloride 97  96 - 112 (mEq/L)    CO2 29  19 - 32 (mEq/L)    Glucose, Bld 108 (*) 70 - 99 (mg/dL)    BUN 11  6 - 23 (mg/dL)    Creatinine, Ser 9.14  0.50 - 1.35 (mg/dL)    Calcium 8.1 (*) 8.4 - 10.5 (mg/dL)    GFR calc non Af Amer >90  >90 (mL/min)    GFR calc Af Amer >90  >90 (mL/min)   GLUCOSE, CAPILLARY     Status: Normal   Collection Time   05/19/11  7:08 AM      Component Value Range Comment   Glucose-Capillary 88  70 - 99 (mg/dL)    Dg Chest 2 View  7/82/9562  *RADIOLOGY REPORT*  Clinical Data: Fever.  Right tonsillar cancer.  Ongoing chemotherapy.  Asthma.  CHEST - 2 VIEW  Comparison: 04/23/2011  Findings: Linear subsegmental atelectasis noted in the left lower lobe.  Mildly low lung volumes are present.  Cardiac and mediastinal contours appear unremarkable.  No pleural effusion noted.  The lungs are otherwise clear.  IMPRESSION:  1.  Linear subsegmental atelectasis in the left lower lobe. Otherwise, no significant abnormality identified.  Original Report Authenticated By: Dellia Cloud, M.D.   Ct Soft Tissue Neck Wo Contrast  05/18/2011  *RADIOLOGY REPORT*  Clinical Data: Sore throat, body aches, low grade fever.  History of parotid tumor with radiation treatments.  Contrast allergy.  CT NECK WITHOUT CONTRAST  Technique:  Multidetector CT imaging  of the neck was performed without intravenous contrast.  Comparison: PET CT scan 02/25/2011  Findings: Enlarged lymph node behind the angle of the mandible on the right measures 1.7 x 1.7 cm.  There is central low attenuation suggesting necrosis.  This is smaller than on the previous study. There are diffusely scattered lymph nodes throughout the anterior and posterior cervical regions bilaterally which are not pathologically enlarged.  No significant tonsillar or mucosal swelling.  Salivary glands appear symmetrical.  Laryngeal structures appear symmetrical.  No new mass or lymphadenopathy is appreciated.  Degenerative changes in the cervical spine.  Reversal of the usual cervical lordosis, likely due to patient positioning. No prevertebral soft tissue swelling.  IMPRESSION: Mass in the right neck behind the angle of the mandible is decreasing in size.  No new mass, lymphadenopathy, or fluid collections identified.  Original Report Authenticated By: Marlon Pel, M.D.     MEDICATIONS: reviewed.     Assessment/Plan:  1. Neutropenic fever:  - Neutropenia due to chemotherapy.  He has  been afebrile since admission.   - Potential source of fever: Oral thrush and PEG tube cellulitis and skin burn at site of radiation . CXR was negative. CT neck showed decrease in size of cancer without abscess. Blood culture negative to date.  PEG tube cellulitis is stable compared to yesterday.   - Continue with day #3 of empiric antibiotic Vancomycin and Cefepime until finalized blood culture. I would defer GCSF due to potential decrease in efficacy of radiation and the use of GCSF.   2. 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 S/p 2 doses of q3wk Cisplatin and daily radiation. Patient preferred to skip the last dose of chemo due this week. I agree with his wish due to neutropenic fever, and grade 3 mucositis.  His radiation was  held yesterday due to mucositis.  Patient himself would like to resume if OK with Rad Onc in order to finish therapy this week.  I personally called down to Rad Onc and spoke to Ms. Otis Peak with this request.   3. Nausea/vomiting: Has Zofran/Ativan/Compazine.   4. Mucositis/xerostomia: Continue salt/baking soda rinses and alternate with Robitussin for thick phlegm.   5. Protein calorie malnutrition: He is no longer able to eat/drink by mouth due to severe mucositis. I avised him to continue with swallowing exercise to reduce the risk of esophageal stenosis. I appreciate Nutrition consult again.  He did not tolerate Osmolyte and Prostat.    6. Mucositis pain control: Fentanyl patch now at 19mcg/hr changed every 3 days. I started him yesterday on Dilaudid elixir PO or via PEG for moderate breakthrough pain. For severe breakthrough pain, he has Dilaudid IV.  I will assess his Dilaudid breakthrough meds requirement and will adjust dose of Fentanyl patch accordingly upon discharge.   7. Anemia: Due to chemotherapy. No active bleeding. No transfusion indicated.   8.  Dispo:  Due to level of care (PEG feeding, mouth rinses, nausea/vomiting,  pain control) while finishing up radiation; he would like to remain for the duration of radiation this week.  We'll continue to reassess daily.

## 2011-05-19 NOTE — Progress Notes (Signed)
Nutrition Note:  TF follow up  Pt reports nausea but no vomit after last Ensure Plus.  Discussed need to spread feedings out and give feedings very slowly.  Pt wishes to proceed with current plan for now.  RD to follow up closely.  I will be off for the next couple days.  Please page Iven Finn, RD who is informed on pt   [phone].  Oran Rein, RD [phone]

## 2011-05-19 NOTE — Progress Notes (Signed)
Stopped by to visit patient of Dr.Moody's to say hello, and introduce myself, put mask on and washed hands before entering, pt still neutropenic, patient had called earlier and spoke with me about when he could resume treatment, informed patient then and now will need to recheck his labs in am, since he has no neutrophils and wbc is 1.o, patient showed me his peg site, is reddened, and scant discharge yellowish on skin, but patient is getting IV antibiotics, patient stated he was being transferred to the 3rd floor, will look at his labs in am, and will get with Dr.Moody for him to see results ,MD to make the decision after seeing results,patient gave verbal understanding 4:01 PM

## 2011-05-19 NOTE — Progress Notes (Signed)
Per DR.Murray no radiation treatment today,patient wbc=1.0,still neutropenic,called therapist and informed 7:46 AM

## 2011-05-19 NOTE — Progress Notes (Signed)
ANTIBIOTIC CONSULT NOTE - FOLLOW UP  Pharmacy Consult for Vancomycin Indication: Febrile Neutropenia  Allergies  Allergen Reactions  . Contrast Media (Iodinated Diagnostic Agents) Anaphylaxis    Not recommended due to severe Iodine allergy.  . Iodine Anaphylaxis  . Shellfish Allergy Anaphylaxis  . Betadine (Povidone Iodine) Rash  . Avelox (Moxifloxacin Hcl In Nacl) Hives  . Morphine And Related Hives  . Prednisone Other (See Comments)    Altered mental status  . Ultram (Tramadol Hcl) Hives    Patient Measurements: Height: 5\' 8"  (172.7 cm) Weight: 204 lb 9.4 oz (92.8 kg) IBW/kg (Calculated) : 68.4   Vital Signs: Temp: 98.1 F (36.7 C) (05/14 1745) Temp src: Oral (05/14 1745) BP: 121/75 mmHg (05/14 1745) Pulse Rate: 96  (05/14 1745) Intake/Output from previous day: 05/13 0701 - 05/14 0700 In: 4591.3 [P.O.:840; I.V.:1766.3; IV Piggyback:800] Out: 2751 [Urine:2750; Emesis/NG output:1] Intake/Output from this shift:    Labs:  Basename 05/19/11 0420 05/18/11 0425 05/17/11 2149  WBC 1.0* 0.9* 1.1*  HGB 8.8* 9.4* 10.4*  PLT 149* 139* 142*  LABCREA -- -- --  CREATININE 0.72 0.70 0.72   Estimated Creatinine Clearance: 130.3 ml/min (by C-G formula based on Cr of 0.72). Normalized CrCL > 153ml/min/1.73m2  Basename 05/19/11 1947  VANCOTROUGH 12.0  VANCOPEAK --  VANCORANDOM --  GENTTROUGH --  GENTPEAK --  GENTRANDOM --  TOBRATROUGH --  TOBRAPEAK --  TOBRARND --  AMIKACINPEAK --  AMIKACINTROU --  AMIKACIN --     Microbiology: Recent Results (from the past 720 hour(s))  CULTURE, BLOOD (ROUTINE X 2)     Status: Normal (Preliminary result)   Collection Time   05/17/11  9:49 PM      Component Value Range Status Comment   Specimen Description BLOOD LEFT ARM   Final    Special Requests BOTTLES DRAWN AEROBIC AND ANAEROBIC 5 CC EA   Final    Culture  Setup Time [id number]   Final    Culture     Final    Value:        BLOOD CULTURE RECEIVED NO GROWTH TO DATE CULTURE  WILL BE HELD FOR 5 DAYS BEFORE ISSUING A FINAL NEGATIVE REPORT   Report Status PENDING   Incomplete   CULTURE, BLOOD (ROUTINE X 2)     Status: Normal (Preliminary result)   Collection Time   05/17/11 10:34 PM      Component Value Range Status Comment   Specimen Description BLOOD RIGHT ARM   Final    Special Requests BOTTLES DRAWN AEROBIC AND ANAEROBIC 4 CC EA   Final    Culture  Setup Time [id number]   Final    Culture     Final    Value:        BLOOD CULTURE RECEIVED NO GROWTH TO DATE CULTURE WILL BE HELD FOR 5 DAYS BEFORE ISSUING A FINAL NEGATIVE REPORT   Report Status PENDING   Incomplete     Anti-infectives     Start     Dose/Rate Route Frequency Ordered Stop   05/18/11 1030   ceFEPIme (MAXIPIME) 2 g in dextrose 5 % 50 mL IVPB        2 g 100 mL/hr over 30 Minutes Intravenous Every 8 hours 05/18/11 0235     05/18/11 0400   fluconazole (DIFLUCAN) IVPB 100 mg        100 mg 50 mL/hr over 60 Minutes Intravenous Every 24 hours 05/18/11 0233     05/18/11 0315  vancomycin (VANCOCIN) IVPB 1000 mg/200 mL premix        1,000 mg 200 mL/hr over 60 Minutes Intravenous  Once 05/18/11 0233 05/18/11 0707   05/18/11 0245   ceFEPIme (MAXIPIME) 2 g in dextrose 5 % 50 mL IVPB        2 g 100 mL/hr over 30 Minutes Intravenous  Once 05/18/11 0233 05/18/11 0450   05/18/11 0234   vancomycin (VANCOCIN) IVPB 1000 mg/200 mL premix        1,000 mg 200 mL/hr over 60 Minutes Intravenous Every 8 hours 05/18/11 0235            Assessment:  46 YOM on Day # 2 Vanc 1 g IV q8h/Cefepime2g IV q12h. Day #2 fluconazole 100mg  IV q24h for thrush.  Scr wnl and stable  Tm 99.1 (Tc98.1)  WBC 1, ANC 0  BCx x 2 = NGTD  Vancomycin trough subtherapeutic 12  Goal of Therapy:  Vancomycin trough level 15-20 mcg/ml  Plan:  Increase Vancomycin to 1250mg  IV q8h. Recheck VT @ Css Continue to monitor and adjust doses as necessary  Gwen Her PharmD  [phone] 05/19/2011 9:09 PM

## 2011-05-19 NOTE — Telephone Encounter (Signed)
Spoke with RN Karin Golden, per Dr.Murray,no rad tx because patient is still neutropenic, will need c-dif with daily cbc also to see what the  absoulte neutrophils are as well, did see Dr.Ha's note ,about mucositis, this isn't reason why rad tx is held today,will recheck labs tomorrow,RN gave verbal understanding 9:51 AM

## 2011-05-19 NOTE — Progress Notes (Signed)
Nutrition Consult  TF  Bolus per PEG:  Ensure Complete 2 cans a day, Osmolite 1.5 4 cans a day, prostat QID- Pt with Nausea and Vomiting last night.  Did not tolerate bolus feeding/prostat.  Diet:  Regular- Unable to take any po secondary to severe mucositis.  Discussed patients care with Outpatient Oncology RD and Dr. Gaylyn Rong.  Spoke with patient.  Discussed plan with RN.  Multiple potentional contributors for N/V include:  Chemo, increased phlem, prostat, volume of feeds, frequency of feeds.  Osmolite 1.5 typically tolerated better than Ensure plus secondary to a much lower osmolality but the negative association with the N/V persists .  We will only use this formula if pt needs to change to continuous TF.  Intervention:  D/C Prostat  D/C Osmolite 1.5  Continue Ensure Complete 2 times a day.  Pt wishes to use Ensure Plus from home for the remaining bolus feedings with goal of 6 cans total daily.  Feed fewer cans today.  1 can every 4-6 hours as pt tolerates.  Change to Bolus TF if pt continues not to tolerate bolus feedings.  Osmolite 1.5 at 20 ml/hr increasing as tolerated to goal of 65 ml/hr.  Will see patient later today to follow up on tolerance.  Oran Rein, RD [phone]

## 2011-05-20 ENCOUNTER — Ambulatory Visit: Payer: BC Managed Care – PPO

## 2011-05-20 ENCOUNTER — Other Ambulatory Visit: Payer: Self-pay | Admitting: Oncology

## 2011-05-20 ENCOUNTER — Ambulatory Visit
Admission: RE | Admit: 2011-05-20 | Discharge: 2011-05-20 | Disposition: A | Discharge status: Home / Self Care | Payer: BC Managed Care – PPO | Source: Ambulatory Visit | Attending: Radiation Oncology | Admitting: Radiation Oncology

## 2011-05-20 ENCOUNTER — Telehealth: Payer: Self-pay | Admitting: *Deleted

## 2011-05-20 ENCOUNTER — Other Ambulatory Visit: Payer: Self-pay | Admitting: *Deleted

## 2011-05-20 DIAGNOSIS — C099 Malignant neoplasm of tonsil, unspecified: Secondary | ICD-10-CM

## 2011-05-20 DIAGNOSIS — K1230 Oral mucositis (ulcerative), unspecified: Secondary | ICD-10-CM

## 2011-05-20 DIAGNOSIS — E46 Unspecified protein-calorie malnutrition: Secondary | ICD-10-CM

## 2011-05-20 LAB — CBC
HCT: 25.3 % — ABNORMAL LOW (ref 39.0–52.0)
MCH: 31.3 pg (ref 26.0–34.0)
MCV: 91 fL (ref 78.0–100.0)
RBC: 2.78 MIL/uL — ABNORMAL LOW (ref 4.22–5.81)
WBC: 0.9 10*3/uL — CL (ref 4.0–10.5)

## 2011-05-20 LAB — DIFFERENTIAL
Band Neutrophils: 0 % (ref 0–10)
Basophils Relative: 0 % (ref 0–1)
Eosinophils Relative: 0 % (ref 0–5)
Lymphs Abs: 0 10*3/uL — ABNORMAL LOW (ref 0.7–4.0)
Monocytes Relative: 0 % — ABNORMAL LOW (ref 3–12)
nRBC: 0 /100 WBC

## 2011-05-20 LAB — GLUCOSE, CAPILLARY: Glucose-Capillary: 118 mg/dL — ABNORMAL HIGH (ref 70–99)

## 2011-05-20 MED ORDER — LEVOFLOXACIN 500 MG PO TABS
500.0000 mg | ORAL_TABLET | Freq: Every day | ORAL | Status: DC
  Administered 2011-05-20 – 2011-05-26 (×7): 500 mg via ORAL
  Filled 2011-05-20 (×7): qty 1

## 2011-05-20 MED ORDER — FLUCONAZOLE 40 MG/ML PO SUSR
100.0000 mg | Freq: Every day | ORAL | Status: DC
  Administered 2011-05-20 – 2011-05-22 (×3): 100 mg via ORAL
  Administered 2011-05-23: 40 mg via ORAL
  Administered 2011-05-25: 100 mg via ORAL
  Administered 2011-05-25: 40 mg via ORAL
  Filled 2011-05-20 (×5): qty 2.5

## 2011-05-20 MED ORDER — LEVOFLOXACIN 25 MG/ML PO SOLN: 500.0000 mg | Freq: Every day | ORAL | Status: DC

## 2011-05-20 MED ORDER — HYDROMORPHONE HCL 1 MG/ML PO LIQD: 1.0000 mg | ORAL | Status: DC | PRN

## 2011-05-20 NOTE — Telephone Encounter (Signed)
Called the floor asked to speak with RN, Onyeje, to let know per Dr.Moody who spoke with Dr.Ha and they have decided to go ahead and treat patient with radiation today and will be brought here before 2pm for treatment.verbal understanding by RN 10:24 AM

## 2011-05-20 NOTE — Telephone Encounter (Signed)
Rx for dilaudid 1mg /ml liquid soln printed by Dr. Gaylyn Rong.  I called [facility] outpt pharmacy and they will order to have some in stock anticipating pt d/c tomorrow.  Rx left w/ Dr. Gaylyn Rong.

## 2011-05-20 NOTE — Progress Notes (Signed)
Department of Radiation Oncology  Phone:  [phone] Fax:        [phone]  Weekly Treatment Note    Name: Kyle Hendrix Date: 05/20/2011 MRN: [id number] DOB: [birth year]   Current dose: 61.5 Gy  Current fraction: 29, the patient's 30th treatment will be completed today.   MEDICATIONS: No current facility-administered medications for this encounter.   Current Outpatient Prescriptions  Medication Sig Dispense Refill  . HYDROmorphone HCl (DILAUDID) 1 MG/ML LIQD Take 1 mL (1 mg total) by mouth every 4 (four) hours as needed (break through pain. ).  210 mL  0   Facility-Administered Medications Ordered in Other Encounters  Medication Dose Route Frequency Provider Last Rate Last Dose  . butalbital-acetaminophen-caffeine (FIORICET, ESGIC) 50-325-40 MG per tablet 2 tablet  2 tablet Oral Q6H PRN Alison Murray, MD   2 tablet at 05/19/11 1238  . feeding supplement (ENSURE COMPLETE) liquid 237 mL  237 mL Oral BID Exie Parody, MD   237 mL at 05/20/11 0931  . fentaNYL (DURAGESIC - dosed mcg/hr) 50 mcg  50 mcg Transdermal Q72H Exie Parody, MD   50 mcg at 05/19/11 0756  . fluconazole (DIFLUCAN) 40 MG/ML suspension 100 mg  100 mg Oral QHS Exie Parody, MD      . guaiFENesin (ROBITUSSIN) 100 MG/5ML solution 200 mg  200 mg Oral Q4H PRN Exie Parody, MD   200 mg at 05/20/11 1150  . HYDROmorphone (DILAUDID) injection 2 mg  2 mg Intravenous Q3H PRN Alison Murray, MD   2 mg at 05/19/11 0259  . HYDROmorphone HCl (DILAUDID) liquid 1 mg  1 mg Oral Q3H PRN Exie Parody, MD   1 mg at 05/20/11 1155  . hydrOXYzine (ATARAX/VISTARIL) tablet 25 mg  25 mg Oral Q8H PRN Exie Parody, MD      . levofloxacin (LEVAQUIN) tablet 500 mg  500 mg Oral Daily Exie Parody, MD   500 mg at 05/20/11 0932  . LORazepam (ATIVAN) tablet 0.5 mg  0.5 mg Oral Q6H PRN Alison Murray, MD   0.5 mg at 05/19/11 1403  . magic mouthwash w/lidocaine  5 mL Oral TID PRN Alison Murray, MD   5 mL at 05/19/11 0802  . magic mouthwash  15 mL Oral QID PRN  Forbes Cellar, MD   15 mL at 05/20/11 0123  . polyethylene glycol (MIRALAX / GLYCOLAX) packet 17 g  17 g Oral Daily PRN Cordelia Pen, NP      . prochlorperazine (COMPAZINE) tablet 10 mg  10 mg Oral Q6H PRN Alison Murray, MD      . promethazine (PHENERGAN) suppository 25 mg  25 mg Rectal Q6H PRN Cordelia Pen, NP   25 mg at 05/20/11 4696  . sertraline (ZOLOFT) tablet 100 mg  100 mg Oral QHS Alison Murray, MD   100 mg at 05/19/11 2108  . DISCONTD: ceFEPIme (MAXIPIME) 2 g in dextrose 5 % 50 mL IVPB  2 g Intravenous Q8H Lorenza Evangelist, PHARMD   2 g at 05/20/11 0225  . DISCONTD: fluconazole (DIFLUCAN) IVPB 100 mg  100 mg Intravenous Q24H Lorenza Evangelist, PHARMD   100 mg at 05/20/11 0408  . DISCONTD: levofloxacin (LEVAQUIN) 25 MG/ML solution 500 mg  500 mg Oral Daily Exie Parody, MD      . DISCONTD: pantoprazole (PROTONIX) injection 40 mg  40 mg Intravenous Q24H Alison Murray, MD   40 mg at 05/20/11 2952  .  DISCONTD: vancomycin (VANCOCIN) 1,250 mg in sodium chloride 0.9 % 250 mL IVPB  1,250 mg Intravenous Q8H Gwen Her, PHARMD      . DISCONTD: vancomycin (VANCOCIN) 1,250 mg in sodium chloride 0.9 % 250 mL IVPB  1,250 mg Intravenous Q8H Gwen Her, PHARMD   1,250 mg at 05/20/11 0555  . DISCONTD: vancomycin (VANCOCIN) IVPB 1000 mg/200 mL premix  1,000 mg Intravenous Q8H Lorenza Evangelist, PHARMD   1,000 mg at 05/19/11 1153     ALLERGIES: Contrast media; Iodine; Shellfish allergy; Betadine; Avelox; Morphine and related; Prednisone; and Ultram   LABORATORY DATA:  Lab Results  Component Value Date   WBC 0.9* 05/20/2011   HGB 8.7* 05/20/2011   HCT 25.3* 05/20/2011   MCV 91.0 05/20/2011   PLT 139* 05/20/2011   Lab Results  Component Value Date   NA 133* 05/19/2011   K 3.8 05/19/2011   CL 97 05/19/2011   CO2 29 05/19/2011   Lab Results  Component Value Date   ALT 17 05/18/2011   AST 11 05/18/2011   ALKPHOS 54 05/18/2011   BILITOT 0.4 05/18/2011     NARRATIVE: Kyle Rea  Hendrix was seen today for weekly treatment management. The chart was checked and the patient's films were reviewed. Patient is doing better. Ongoing skin irritation and irritation in the mouth. The patient has been an inpatient with neutropenia. No sign of significant infection in the patient will likely be able to go home within the next day or 2.  PHYSICAL EXAMINATION: vitals were not taken for this visit.     significant skin irritation present but this I think it looks fine for where he is in his treatment. Significant mucositis present in the posterior oral cavity.  ASSESSMENT: The patient is doing satisfactorily with treatment.  PLAN: We will continue with the patient's radiation treatment as planned. We will proceed with his treatment. He has 4 fractions remaining and at this time I believe that the risks of not continuing his treatment outweigh the benefits of further delay. He has missed a couple of days due to his hospitalization and neutropenia. His counts still remain low but I have discussed this with medical oncology and based on this conversation and how he looks today I am comfortable with him proceeding. I do believe that the benefits of this outweigh the risks at this time.

## 2011-05-20 NOTE — ED Provider Notes (Signed)
Medical screening examination/treatment/procedure(s) were conducted as a shared visit with non-physician practitioner(s) and myself.  I personally evaluated the patient during the encounter  Tachycardic. Posterior OP with exudate v/s thrush. Trismus. No muffled voice. Pt with subj sense of throat swelling from previous. Neutropenic with bands noted. CT neck unremarkable. Plans for admission.  Forbes Cellar, MD 05/20/11 [phone]

## 2011-05-20 NOTE — Progress Notes (Signed)
[facility] Radiation Oncology Dept Therapy Treatment Record Phone [phone]   Radiation Therapy was administered to Kyle Hendrix on: 05/20/2011  2:41 PM and was treatment # 30 out of a planned course of 33 treatments.

## 2011-05-20 NOTE — Progress Notes (Signed)
Nutrition Brief Note  - Pt getting via PEG Ensure Complete 2 cans daily and Ensure Plus 4 cans daily. Pt states this plan is working well for him and pt denies any nausea. Will continue to monitor for TF tolerance.   Dietitian# [phone]

## 2011-05-20 NOTE — Progress Notes (Signed)
Notified Dr. Juliette Alcide, on call provider, that the PIV had been removed earlier in the day. Dr. Juliette Alcide stated that we can leave the IV out at this time. Eugene Garnet [facility] 05/20/2011 10:55 PM

## 2011-05-20 NOTE — Progress Notes (Signed)
Here for routine weekly under treat visit for tonsillar ca treatment.Pain level 6 in treatment field (throat/tongue) Mucositis is clearing.Skin has some dry peeling and is not as red.Patient has completed 29 of 33 treatment. Moderate fatigue.Ensure 6 cans daily thru peg tube.

## 2011-05-20 NOTE — Progress Notes (Signed)
[facility] INPATIENT PROGRESS NOTE  Name: Kyle Hendrix      MRN: [id number]    Location: 1303/1303-01  Date: 05/20/2011 Time:9:24 AM   Subjective: Interval History:Kyle Hendrix has not had radiation this week due to concern for neutropenia.  His mucositis pain was under stable control.  He has been having response with liquid Dilaudid.  He does not want to increase the dose as to avoid over sedation.  He has changed his PEG tube to only Ensure plus and Ensure without Osmolyte or Jevity as he does not like them due to nausea/vomiting.  His PEG tube erythema is stable.   Objective: Vital signs in last 24 hours: Temp:  [97.8 F (36.6 C)-98.6 F (37 C)] 97.8 F (36.6 C) (05/15 0600) Pulse Rate:  [68-102] 68  (05/15 0600) Resp:  [16-22] 20  (05/15 0600) BP: (105-121)/(66-75) 118/74 mmHg (05/15 0600) SpO2:  [96 %-100 %] 98 % (05/15 0600) Weight:  [198 lb 10.2 oz (90.1 kg)] 198 lb 10.2 oz (90.1 kg) (05/15 0500)      PHYSICAL EXAM:  Gen: Well-nourished man, tired-appearing. Eyes: No scleral icterus or jaundice. ENT: There was no oropharyngeal mass but positive for erythema and oral thrush on the soft palate which is slightly improved since admission. Neck was supple without thyromegaly. Lymphatics: Negative for cervical, supraclavicular, axillary, or inguinal adenopathy. Skin in the neck was red but without opened wound. Respiratory: Lungs were clear bilaterally without wheezing or crackles. Cardiovascular: normal heart rate and rhythm; S1/S2; without murmur, rubs, or gallop. There was no pedal edema. GI: Abdomen was soft, flat, nontender, nondistended, without organomegaly. PEG tube showed stable erythema and positive for purulent discharge without pain on palpation.  Musculoskeletal exam: No spinal tenderness on palpation of vertebral spine. Skin exam was without ecchymosis, petechiae.  There was no diffuse rash on the back or arms.  Neuro exam was nonfocal. Patient was  alert and oriented. Attention was good. Language was appropriate. Mood was normal without depression. Speech was not pressured. Thought content was not tangential.    Studies/Results: Results for orders placed during the hospital encounter of 05/17/11 (from the past 48 hour(s))  CBC     Status: Abnormal   Collection Time   05/19/11  4:20 AM      Component Value Range Comment   WBC 1.0 (*) 4.0 - 10.5 (K/uL)    RBC 2.85 (*) 4.22 - 5.81 (MIL/uL)    Hemoglobin 8.8 (*) 13.0 - 17.0 (g/dL)    HCT 40.9 (*) 81.1 - 52.0 (%)    MCV 90.5  78.0 - 100.0 (fL)    MCH 30.9  26.0 - 34.0 (pg)    MCHC 34.1  30.0 - 36.0 (g/dL)    RDW 91.4  78.2 - 95.6 (%)    Platelets 149 (*) 150 - 400 (K/uL)   BASIC METABOLIC PANEL     Status: Abnormal   Collection Time   05/19/11  4:20 AM      Component Value Range Comment   Sodium 133 (*) 135 - 145 (mEq/L)    Potassium 3.8  3.5 - 5.1 (mEq/L)    Chloride 97  96 - 112 (mEq/L)    CO2 29  19 - 32 (mEq/L)    Glucose, Bld 108 (*) 70 - 99 (mg/dL)    BUN 11  6 - 23 (mg/dL)    Creatinine, Ser 2.13  0.50 - 1.35 (mg/dL)    Calcium 8.1 (*) 8.4 - 10.5 (mg/dL)  GFR calc non Af Amer >90  >90 (mL/min)    GFR calc Af Amer >90  >90 (mL/min)   DIFFERENTIAL     Status: Abnormal   Collection Time   05/19/11  4:20 AM      Component Value Range Comment   Neutrophils Relative 0 (*) 43 - 77 (%)    Lymphocytes Relative 0 (*) 12 - 46 (%)    Monocytes Relative 0 (*) 3 - 12 (%)    Eosinophils Relative 0  0 - 5 (%)    Basophils Relative 0  0 - 1 (%)    Band Neutrophils 0  0 - 10 (%)    nRBC 0  0 (/100 WBC)    Lymphs Abs 0.0 (*) 0.7 - 4.0 (K/uL)    Monocytes Absolute 0.0 (*) 0.1 - 1.0 (K/uL)    Eosinophils Absolute 0.0  0.0 - 0.7 (K/uL)    Basophils Absolute 0.0  0.0 - 0.1 (K/uL)    WBC Morphology TOO FEW TO COUNT, SMEAR AVAILABLE FOR REVIEW      Smear Review PLATELET COUNT CONFIRMED BY SMEAR     GLUCOSE, CAPILLARY     Status: Normal   Collection Time   05/19/11  7:08 AM       Component Value Range Comment   Glucose-Capillary 88  70 - 99 (mg/dL)   VANCOMYCIN, TROUGH     Status: Normal   Collection Time   05/19/11  7:47 PM      Component Value Range Comment   Vancomycin Tr 12.0  10.0 - 20.0 (ug/mL)   CBC     Status: Abnormal   Collection Time   05/20/11  7:41 AM      Component Value Range Comment   WBC 0.9 (*) 4.0 - 10.5 (K/uL)    RBC 2.78 (*) 4.22 - 5.81 (MIL/uL)    Hemoglobin 8.7 (*) 13.0 - 17.0 (g/dL)    HCT 24.4 (*) 01.0 - 52.0 (%)    MCV 91.0  78.0 - 100.0 (fL)    MCH 31.3  26.0 - 34.0 (pg)    MCHC 34.4  30.0 - 36.0 (g/dL)    RDW 27.2  53.6 - 64.4 (%)    Platelets 139 (*) 150 - 400 (K/uL)   DIFFERENTIAL     Status: Abnormal   Collection Time   05/20/11  7:41 AM      Component Value Range Comment   Neutrophils Relative 0 (*) 43 - 77 (%)    Lymphocytes Relative 0 (*) 12 - 46 (%)    Monocytes Relative 0 (*) 3 - 12 (%)    Eosinophils Relative 0  0 - 5 (%)    Basophils Relative 0  0 - 1 (%)    Band Neutrophils 0  0 - 10 (%)    nRBC 0  0 (/100 WBC)    Lymphs Abs 0.0 (*) 0.7 - 4.0 (K/uL)    Monocytes Absolute 0.0 (*) 0.1 - 1.0 (K/uL)    Eosinophils Absolute 0.0  0.0 - 0.7 (K/uL)    Basophils Absolute 0.0  0.0 - 0.1 (K/uL)    WBC Morphology WHITE COUNT CONFIRMED ON SMEAR      Smear Review PLATELET COUNT CONFIRMED BY SMEAR     GLUCOSE, CAPILLARY     Status: Abnormal   Collection Time   05/20/11  8:22 AM      Component Value Range Comment   Glucose-Capillary 118 (*) 70 - 99 (mg/dL)    No  results found.   MEDICATIONS: reviewed.     Assessment/Plan:  1. Neutropenic fever:  - Neutropenia due to chemotherapy.  He has been afebrile since admission.   - Potential source of fever: Oral thrush and PEG tube cellulitis and skin burn at site of radiation . CXR was negative. CT neck showed decrease in size of cancer without abscess. Blood culture negative to date.  PEG tube cellulitis is improving.  - As blood cultures have been negative.  I discontinue  empiric IV antibiotics and started him on empiric oral Levaquin as he is still neutropenic.  He told me that he had some hives with IV Avelox; however, at the time he was also on another medication and could not tell for sure if he is allergic to Avelox.  Since Levaquin has broad coverage; and he is neutropenic, I recommended oral Levaquin while he is here in the hospital to observe.  He expressed informed understanding and wished to proceed.  I would still defer GCSF due to potential decrease in efficacy of radiation and the use of GCSF.   2. 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 S/p 2 doses of q3wk Cisplatin and daily radiation. I personally spoke with Dr. Mitzi Hansen today to see when patient should resume radiation since his neutropenia may take a while to improve.   3. Nausea/vomiting: Has Zofran/Ativan/Compazine.   4. Mucositis/xerostomia: Continue salt/baking soda rinses and alternate with Robitussin for thick phlegm.   5. Protein calorie malnutrition: He is on Ensure and Ensure plus.   6. Mucositis pain control: Fentanyl patch now at 25mcg/hr changed every 3 days.  We will continue oral Dilaudid for breakthrough pain.  He does not want to increase the dose to avoid sedation.    7. Anemia: Due to chemotherapy. No active bleeding. No transfusion indicated.   8.  Dispo:  Pending decision to resume radiation or not.    FULL CODE.

## 2011-05-20 NOTE — Telephone Encounter (Signed)
called floor spoke with Aram Beecham ,at front desk, nurse unavailable , no radiation treatment today per Dr.Moody, wbc=0.9, neutrophil=0, she will let nurse know,thanked her 9:31 AM

## 2011-05-21 ENCOUNTER — Ambulatory Visit
Admission: RE | Admit: 2011-05-21 | Discharge: 2011-05-21 | Disposition: A | Discharge status: Home / Self Care | Payer: BC Managed Care – PPO | Source: Ambulatory Visit | Attending: Radiation Oncology | Admitting: Radiation Oncology

## 2011-05-21 LAB — CBC
HCT: 27.2 % — ABNORMAL LOW (ref 39.0–52.0)
Hemoglobin: 9.3 g/dL — ABNORMAL LOW (ref 13.0–17.0)
MCH: 31.1 pg (ref 26.0–34.0)
MCHC: 34.2 g/dL (ref 30.0–36.0)
RDW: 14.6 % (ref 11.5–15.5)

## 2011-05-21 LAB — GLUCOSE, CAPILLARY: Glucose-Capillary: 107 mg/dL — ABNORMAL HIGH (ref 70–99)

## 2011-05-21 MED ORDER — SODIUM CHLORIDE 0.9 % IV SOLN
INTRAVENOUS | Status: DC
  Administered 2011-05-21: 980 mL via INTRAVENOUS
  Administered 2011-05-22: 1000 mL via INTRAVENOUS
  Administered 2011-05-23 – 2011-05-25 (×4): via INTRAVENOUS
  Administered 2011-05-25: 1000 mL via INTRAVENOUS
  Administered 2011-05-25 – 2011-05-26 (×2): via INTRAVENOUS

## 2011-05-21 MED ORDER — OSMOLITE 1.2 CAL PO LIQD
1000.0000 mL | ORAL | Status: DC
  Administered 2011-05-21 – 2011-05-25 (×7): 1000 mL
  Filled 2011-05-21: qty 1000

## 2011-05-21 MED ORDER — GUAIFENESIN 100 MG/5ML PO SOLN
200.0000 mg | ORAL | Status: DC
  Administered 2011-05-21 – 2011-05-26 (×31): 200 mg via ORAL
  Filled 2011-05-21 (×46): qty 10

## 2011-05-21 NOTE — Progress Notes (Signed)
Nutrition Brief Note  - Pt reports nausea improved from yesterday, however stated he vomited mucous 3 times so far today. Pt thinks Phenergan working well for him. Pt and MD interested in changing TF to continuous. Will order Osmolite 1.2 via PEG start at 49ml/hr increase by 10ml q4hr as tolerated to goal of 67ml/hr. This will provide 2592 calories, 120g protein, and free water - meeting 100% of estimated nutritional needs. Encouraged pt to let RN know when pt feels nauseated. Discussed anti-emetics with pharmacy who suggested scheduled Zofran be resumed if pt still c/o nausea. Will monitor for TF tolerance and advancement.   Dietitian# [phone]

## 2011-05-21 NOTE — Progress Notes (Signed)
Pt refuses to use oral tussin for mouth wash because he states he did not like the taste of the medication in his mouth. He continues to pour  The guaifenesin Into his PEG tube. MD notified.

## 2011-05-21 NOTE — Progress Notes (Signed)
[facility] INPATIENT PROGRESS NOTE  Name: Kyle Hendrix      MRN: [id number]    Location: 1303/1303-01  Date: 05/21/2011 Time:8:47 AM   Subjective: Interval History:[name] resumed his radiation again yesterday after talking with Dr. Mitzi Hansen.   He has again developed nausea/vomiting last night.  He thinks that maybe it is from him taking dilaudid via PEG without much food.  He also had a few vomiting episodes this morning; however, he thinks that he may be having some relief with Compazine PR.  He still has mucositis pain.  He has been taking Robitussin about 3-4xday by the PEG tube instead of mouth rinse/gaggle.  He denies any more fever.  He still has skin rash in the right neck at site of radiation.  His PEG tube is not as erythematous; and he does not see purulent discharge from the PEG tube.  He did not have any hive or skin rash with Levaquin yesterday.   Objective: Vital signs in last 24 hours: Temp:  [98.2 F (36.8 C)-98.6 F (37 C)] 98.4 F (36.9 C) (05/16 0635) Pulse Rate:  [83-100] 85  (05/16 0635) Resp:  [18-20] 18  (05/16 0635) BP: (107-126)/(69-76) 107/69 mmHg (05/16 0635) SpO2:  [97 %-98 %] 98 % (05/16 0635)      PHYSICAL EXAM:  Gen: Well-nourished man, tired-appearing. Eyes: No scleral icterus or jaundice. ENT: There was no oropharyngeal mass but positive for erythema and oral thrush on the soft palate which is slightly improved since admission. Neck was supple without thyromegaly. Lymphatics: Negative for cervical, supraclavicular, axillary, or inguinal adenopathy. Skin in the neck was red but without opened wound. Respiratory: Lungs were clear bilaterally without wheezing or crackles. Cardiovascular: normal heart rate and rhythm; S1/S2; without murmur, rubs, or gallop. There was no pedal edema. GI: Abdomen was soft, flat, nontender, nondistended, without organomegaly. PEG tube showed only slight residual erythema around the entry site and without  purulent discharge or pain on palpation.  Musculoskeletal exam: No spinal tenderness on palpation of vertebral spine. Skin exam was without ecchymosis, petechiae.  Neuro exam was nonfocal. Patient was alert and oriented. Attention was good. Language was appropriate. Mood was normal without depression. Speech was not pressured. Thought content was not tangential.    Studies/Results: Results for orders placed during the hospital encounter of 05/17/11 (from the past 48 hour(s))  VANCOMYCIN, TROUGH     Status: Normal   Collection Time   05/19/11  7:47 PM      Component Value Range Comment   Vancomycin Tr 12.0  10.0 - 20.0 (ug/mL)   CBC     Status: Abnormal   Collection Time   05/20/11  7:41 AM      Component Value Range Comment   WBC 0.9 (*) 4.0 - 10.5 (K/uL)    RBC 2.78 (*) 4.22 - 5.81 (MIL/uL)    Hemoglobin 8.7 (*) 13.0 - 17.0 (g/dL)    HCT 65.7 (*) 84.6 - 52.0 (%)    MCV 91.0  78.0 - 100.0 (fL)    MCH 31.3  26.0 - 34.0 (pg)    MCHC 34.4  30.0 - 36.0 (g/dL)    RDW 96.2  95.2 - 84.1 (%)    Platelets 139 (*) 150 - 400 (K/uL)   DIFFERENTIAL     Status: Abnormal   Collection Time   05/20/11  7:41 AM      Component Value Range Comment   Neutrophils Relative 0 (*) 43 - 77 (%)  Lymphocytes Relative 0 (*) 12 - 46 (%)    Monocytes Relative 0 (*) 3 - 12 (%)    Eosinophils Relative 0  0 - 5 (%)    Basophils Relative 0  0 - 1 (%)    Band Neutrophils 0  0 - 10 (%)    nRBC 0  0 (/100 WBC)    Lymphs Abs 0.0 (*) 0.7 - 4.0 (K/uL)    Monocytes Absolute 0.0 (*) 0.1 - 1.0 (K/uL)    Eosinophils Absolute 0.0  0.0 - 0.7 (K/uL)    Basophils Absolute 0.0  0.0 - 0.1 (K/uL)    WBC Morphology WHITE COUNT CONFIRMED ON SMEAR      Smear Review PLATELET COUNT CONFIRMED BY SMEAR     GLUCOSE, CAPILLARY     Status: Abnormal   Collection Time   05/20/11  8:22 AM      Component Value Range Comment   Glucose-Capillary 118 (*) 70 - 99 (mg/dL)   CBC     Status: Abnormal   Collection Time   05/21/11  6:32 AM       Component Value Range Comment   WBC 1.0 (*) 4.0 - 10.5 (K/uL)    RBC 2.99 (*) 4.22 - 5.81 (MIL/uL)    Hemoglobin 9.3 (*) 13.0 - 17.0 (g/dL)    HCT 21.3 (*) 08.6 - 52.0 (%)    MCV 91.0  78.0 - 100.0 (fL)    MCH 31.1  26.0 - 34.0 (pg)    MCHC 34.2  30.0 - 36.0 (g/dL)    RDW 57.8  46.9 - 62.9 (%)    Platelets 170  150 - 400 (K/uL)    No results found.   MEDICATIONS: reviewed.     Assessment/Plan:  1. Neutropenic fever:  - Neutropenia due to chemotherapy.  He has been afebrile since admission.   - Potential source of fever: Oral thrush and PEG tube cellulitis and skin burn at site of radiation . CXR was negative. CT neck showed decrease in size of cancer without abscess. Blood culture negative to date.  PEG tube cellulitis is improving.  - His empiric antibiotics Vancomycin and Cefepime were discontinued on 05/20/11.  He is now on day #2 of empiric Levaquin (without reaction).   I would still defer GCSF due to potential decrease in efficacy of radiation and the use of GCSF.  He does not have sepsis or overwhelming infection to warrant GCSF.  In addition, GCSF has not been proven to improve overall survival; just duration of hospital stay if patients continue to have neutropenic fever.  His neutrophil is expected to recover about one month after finish of radiation.  Patient and his wife expressed informed understanding and agreed to skip GCSF at this time.   2. 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 S/p 2 doses of q3wk Cisplatin and daily radiation.  He is finished with chemotherapy.  He is not sure about resuming radiation today due to his weakness and still neutropenia. I explained to him and his wife that prolonged gap between radiation fractions can potentially decrease the chance of cure.  I advised him and his wife to discuss again with Dr. Mitzi Hansen as to when to resume radiation.   3. Nausea/vomiting: Has  Zofran/Ativan/Compazine.  He had more vomiting last night.  I advised him to continue with IV antiemetics and per rectum  Phenergan if he does not tolerate PO antiemetics.  I will resume IVF due to his nausea/vomiting to prevent dehydration.    4. Mucositis/xerostomia: Continue salt/baking soda rinses and alternate with Robitussin for thick phlegm.  He has been taking Robitussin via PEG tube which is not as effective as taking oral swish/gargle/spit to thin out the thick phlegm.   5. Protein calorie malnutrition: He has been on Ensure and Ensure plus by gravity.  I again appreciate Nutrition eval to see if we can switch from bolus feeding to continuous PEG tube feeding via pump to have better toleration.   6. Mucositis pain control: Fentanyl patch now at 76mcg/hr changed every 3 days.  We will continue oral Dilaudid for breakthrough pain.  He does not want to increase the dose to avoid sedation.  He also has IV Dilaudid for severe breakthrough pain or if he has intractable nausea/vomiting.   7. Anemia: Due to chemotherapy. No active bleeding. No transfusion indicated.   8.  Dispo:  Pending improvement of his nausea/vomiting.  But most likely these will not improve until he finishes with radiation.     FULL CODE.

## 2011-05-21 NOTE — Progress Notes (Signed)
[facility] Radiation Oncology Dept Therapy Treatment Record Phone [phone]   Radiation Therapy was administered to Kyle Hendrix on: 05/21/2011  2:33 PM and was treatment # 31 out of a planned course of 33 treatments.

## 2011-05-22 ENCOUNTER — Ambulatory Visit: Payer: BC Managed Care – PPO

## 2011-05-22 ENCOUNTER — Ambulatory Visit
Admission: RE | Admit: 2011-05-22 | Discharge: 2011-05-22 | Disposition: A | Discharge status: Home / Self Care | Payer: BC Managed Care – PPO | Source: Ambulatory Visit | Attending: Radiation Oncology | Admitting: Radiation Oncology

## 2011-05-22 LAB — DIFFERENTIAL
Basophils Relative: 0 % (ref 0–1)
Eosinophils Absolute: 0 10*3/uL (ref 0.0–0.7)
Eosinophils Relative: 2 % (ref 0–5)
Lymphs Abs: 0.2 10*3/uL — ABNORMAL LOW (ref 0.7–4.0)
Monocytes Relative: 7 % (ref 3–12)
Neutrophils Relative %: 74 % (ref 43–77)

## 2011-05-22 LAB — BASIC METABOLIC PANEL
BUN: 7 mg/dL (ref 6–23)
Calcium: 9 mg/dL (ref 8.4–10.5)
Creatinine, Ser: 0.66 mg/dL (ref 0.50–1.35)
GFR calc Af Amer: >90 mL/min (ref >90)
GFR calc non Af Amer: >90 mL/min (ref >90)
Glucose, Bld: 98 mg/dL (ref 70–99)

## 2011-05-22 LAB — CBC
Hemoglobin: 8.8 g/dL — ABNORMAL LOW (ref 13.0–17.0)
MCH: 31.5 pg (ref 26.0–34.0)
MCHC: 34.5 g/dL (ref 30.0–36.0)
MCV: 91.4 fL (ref 78.0–100.0)
RBC: 2.79 MIL/uL — ABNORMAL LOW (ref 4.22–5.81)
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
## 2011-05-22 NOTE — Progress Notes (Signed)
Nutrition Brief Note  - Pt reports no vomiting since he was switched to continuous TF. Pt reports tolerating TF well. Pt c/o a little bit of nausea around 3am this morning but got some Ativan which pt stated helped control it and put him back to sleep. Pt's TF increased from Osmolite 1.2 at 19ml/hr, which he reached last night, to 64ml/hr this morning. Pt without any nutritional concerns. Will continue to monitor TF tolerance and advancement.   Dietitian# [phone]

## 2011-05-23 DIAGNOSIS — R5081 Fever presenting with conditions classified elsewhere: Secondary | ICD-10-CM

## 2011-05-23 LAB — CBC
MCH: 31 pg (ref 26.0–34.0)
MCHC: 34.2 g/dL (ref 30.0–36.0)
Platelets: 168 10*3/uL (ref 150–400)
RDW: 14.7 % (ref 11.5–15.5)

## 2011-05-23 NOTE — Progress Notes (Signed)
IP PROGRESS NOTE  Subjective:   No nausea. Sore mouth. Tolerating liquids.  Objective: Vital signs in last 24 hours: Blood pressure 107/60, pulse 78, temperature 98.5 F (36.9 C), temperature source Oral, resp. rate 18, height 5\' 8"  (1.727 m), weight 196 lb 9.6 oz (89.177 kg), SpO2 99.00%.  Intake/Output from previous day: 05/17 0701 - 05/18 0700 In: 1471 [P.O.:480] Out: 2100 [Urine:2100]  Physical Exam:  HEENT: No thrush, no discrete ulcers Lungs: Clear bilaterally Cardiac: Regular rate and rhythm Abdomen: Nontender, no hepatomegaly, G-tube site without evidence of infection Extremities: No leg edema   Lab Results:  Basename 05/23/11 0340 05/22/11 0335  WBC 1.3* 1.4*  HGB 8.3* 8.8*  HCT 24.3* 25.5*  PLT 168 171    BMET  Basename 05/22/11 0335  NA 139  K 3.9  CL 102  CO2 28  GLUCOSE 98  BUN 7  CREATININE 0.66  CALCIUM 9.0    Studies/Results: No results found.  Medications: I have reviewed the patient's current medications.   1. Febrile neutropenia-no evidence for an infection at the PEG tube site. Maintained on Levaquin  2. Right tonsil squamous cell carcinoma-status post cisplatin and radiation, radiation to be completed on 05/25/2011  3. Nausea/vomiting-improved  4. Mucositis secondary to radiation and chemotherapy  5. Nutrition-continue tube feedings  6. Pain secondary to mucositis-maintained on a Duragesic patch  7. Anemia secondary to chemotherapy  Plan to continue the current care. He will complete radiation on 05/25/2011   LOS: 6 days   Cipriana Biller  05/23/2011, 10:17 AM

## 2011-05-24 LAB — CULTURE, BLOOD (ROUTINE X 2)
Culture  Setup Time: 201305130222
Culture: NO GROWTH

## 2011-05-24 LAB — CBC
MCV: 92.2 fL (ref 78.0–100.0)
Platelets: 206 10*3/uL (ref 150–400)
RBC: 2.81 MIL/uL — ABNORMAL LOW (ref 4.22–5.81)
RDW: 14.7 % (ref 11.5–15.5)
WBC: 1.6 10*3/uL — ABNORMAL LOW (ref 4.0–10.5)

## 2011-05-24 NOTE — Progress Notes (Signed)
IP PROGRESS NOTE  Subjective:  The mouth feels better. Tolerating liquids. No nausea.  Objective: Vital signs in last 24 hours: Blood pressure 109/73, pulse 87, temperature 98.8 F (37.1 C), temperature source Oral, resp. rate 20, height 5\' 8"  (1.727 m), weight 199 lb 4.7 oz (90.4 kg), SpO2 95.00%.  Intake/Output from previous day: 05/18 0701 - 05/19 0700 In: 1431.7 [I.V.:1431.7] Out: 2750 [Urine:2750]  Physical Exam:  HEENT: No thrush, no discrete ulcers Lungs: Clear bilaterally Cardiac: Regular rate and rhythm Abdomen: Nontender, no hepatomegaly, G-tube site with faint erythema Extremities: No leg edema   Lab Results:  Basename 05/24/11 0453 05/23/11 0340  WBC 1.6* 1.3*  HGB 8.8* 8.3*  HCT 25.9* 24.3*  PLT 206 168    BMET  Basename 05/22/11 0335  NA 139  K 3.9  CL 102  CO2 28  GLUCOSE 98  BUN 7  CREATININE 0.66  CALCIUM 9.0    Studies/Results: No results found.  Medications: I have reviewed the patient's current medications.   1. Febrile neutropenia- Maintained on Levaquin  2. Right tonsil squamous cell carcinoma-status post cisplatin and radiation, radiation to be completed on 05/25/2011  3. Nausea/vomiting-improved  4. Mucositis secondary to radiation and chemotherapy  5. Nutrition-continue tube feedings and diet as tolerated  6. Pain secondary to mucositis-maintained on a Duragesic patch  7. Anemia secondary to chemotherapy-stable  Plan to continue the current care. He will complete radiation on 05/25/2011   LOS: 7 days   Airam Runions  05/24/2011, 8:39 AM

## 2011-05-25 ENCOUNTER — Encounter: Payer: Self-pay | Admitting: Radiation Oncology

## 2011-05-25 ENCOUNTER — Ambulatory Visit
Admission: RE | Admit: 2011-05-25 | Discharge: 2011-05-25 | Disposition: A | Discharge status: Home / Self Care | Payer: BC Managed Care – PPO | Source: Ambulatory Visit | Attending: Radiation Oncology | Admitting: Radiation Oncology

## 2011-05-25 ENCOUNTER — Telehealth: Payer: Self-pay | Admitting: *Deleted

## 2011-05-25 ENCOUNTER — Ambulatory Visit: Payer: BC Managed Care – PPO

## 2011-05-25 VITALS — BP 110/72 | HR 93 | Temp 98.6°F | Resp 20 | Wt 200.7 lb

## 2011-05-25 DIAGNOSIS — C099 Malignant neoplasm of tonsil, unspecified: Secondary | ICD-10-CM

## 2011-05-25 LAB — GLUCOSE, CAPILLARY
Glucose-Capillary: 128 mg/dL — ABNORMAL HIGH (ref 70–99)
Glucose-Capillary: 96 mg/dL (ref 70–99)

## 2011-05-25 LAB — CBC
Hemoglobin: 8 g/dL — ABNORMAL LOW (ref 13.0–17.0)
MCHC: 34 g/dL (ref 30.0–36.0)
WBC: 1.5 10*3/uL — ABNORMAL LOW (ref 4.0–10.5)

## 2011-05-25 MED ORDER — HYDROMORPHONE HCL 1 MG/ML PO LIQD
2.0000 mg | ORAL | Status: DC | PRN
  Administered 2011-05-25 – 2011-05-26 (×5): 2 mg via ORAL
  Filled 2011-05-25 (×5): qty 2

## 2011-05-25 MED ORDER — HYDROCODONE-ACETAMINOPHEN 7.5-500 MG/15ML PO SOLN: 15.0000 mL | Freq: Four times a day (QID) | ORAL | Status: DC | PRN

## 2011-05-25 MED ORDER — FENTANYL 75 MCG/HR TD PT72
75.0000 ug | MEDICATED_PATCH | TRANSDERMAL | Status: DC
  Administered 2011-05-25: 75 ug via TRANSDERMAL
  Filled 2011-05-25: qty 1

## 2011-05-25 MED ORDER — BIAFINE EX EMUL
CUTANEOUS | Status: DC | PRN
  Administered 2011-05-25: 15:00:00 via TOPICAL

## 2011-05-25 NOTE — Progress Notes (Signed)
[facility] Radiation Oncology Dept Therapy Treatment Record Phone [phone]   Radiation Therapy was administered to Kyle Hendrix on: 05/25/2011  2:34 PM and was treatment # 33 out of a planned course of 33 treatments.

## 2011-05-25 NOTE — Telephone Encounter (Signed)
Called nurse Lanora Manis and notifed her of patient calling radiation nursing asking for something stronger than biafine with stronger pain meds, we d not have anything srtonger, maybe Ladona Mow can get involved with his care, per Lanora Manis she stated:Dr.Ha is going to possible get in touch with her for patient tomorrow", she will address patient pain level.now

## 2011-05-25 NOTE — Progress Notes (Signed)
Weekly Management Note:  Site:Tonsil/Neck Current Dose:  6996  cGy Projected Dose: 6996  CGy  Inpatient  Narrative: The patient is seen today for routine under treatment assessment. CBCT/MVCT images/port films were reviewed. The chart was reviewed.   He is without new complaints today. He expects to go home tomorrow. He is using Biafine cream along his neck for his desquamation. He is finishing up a course of Diflucan for candidiasis. His weight is stable.  Physical Examination:  Filed Vitals:   05/25/11 1452  BP: 110/72  Pulse: 93  Temp: 98.6 F (37 C)  Resp: 20    Weight: 200 lb 11.2 oz (91.037 kg).    Oral cavity remarkable for thickened secretions and confluent moist desquamation along the oropharynx. No evidence for candidiasis. On the patient and neck there is still residual adenopathy along the right neck but marked regression. There is impending moist desquamation along the right lower neck .    Laboratory data:  Lab Results  Component Value Date   WBC 1.5* 05/25/2011   HGB 8.0* 05/25/2011   HCT 23.5* 05/25/2011   MCV 91.8 05/25/2011   PLT 215 05/25/2011    Impression: Satisfactory progress.   Plan: The patient will return to see Dr. Mitzi Hansen in one week. He may use Triple Ab ointment if he develops a moist desquamatio along his neck.

## 2011-05-25 NOTE — Progress Notes (Signed)
Patient,alert,oriented x3, wearing mack, wbc=1.5, hgb=8.0,plts=2156, patient pain "6", stated last taken dilaudid via peg around 1100am, gave another biafine cream, neck bright erythema, dry desquamation back of neck and front of neck, IVF"s NS@ 117ml/hr, right hand, dry,intact, no redness or swelling at wrist,  osmolite 1.2 cal via peg at 90 ml/hr, peg site,clean,no redness noted, patient completes radiation treatment today, possible going home tomorrow, 1 month f/u appt made June 21 ,2013 at 100am with Dr.Moody 2:58 PM
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
## 2011-05-25 NOTE — Progress Notes (Addendum)
Nutrition Follow-up  Diet Order: Regular, pt reports only food he plans on eating today is chicken noodle soup  TF: Osmolite 1.2 at 4ml/hr via PEG - provides 2592 calories, 120g protein, and free water - meeting 100% of estimated nutritional needs   - Pt getting last dose of radiation today. Pt c/o of only a little bit of nausea from medications, not TF. Pt reports tolerating TF well. RN reports pt without any TF residuals today. Pt reports having 1 formed BM yesterday and 1 today. Noted plans to possibly d/c tomorrow. Recommend MD write order to resume home TF at discharge as pt desires to switch back to his Ensure Plus bolus TF at home.   Meds: Scheduled Meds:   . fentaNYL  75 mcg Transdermal Q72H  . fluconazole  100 mg Oral QHS  . guaiFENesin  200 mg Oral Q3H  . levofloxacin  500 mg Oral Daily  . sertraline  100 mg Oral QHS  . DISCONTD: fentaNYL  50 mcg Transdermal Q72H   Continuous Infusions:   . sodium chloride 1,000 mL (05/25/11 1154)  . feeding supplement (OSMOLITE 1.2 CAL) 1,000 mL (05/25/11 0846)   PRN Meds:.butalbital-acetaminophen-caffeine, HYDROcodone-acetaminophen, HYDROmorphone HCl, hydrOXYzine, LORazepam, magic mouthwash w/lidocaine, magic mouthwash, polyethylene glycol, prochlorperazine, promethazine, DISCONTD:  HYDROmorphone (DILAUDID) injection, DISCONTD: HYDROmorphone HCl  Labs:  CMP     Component Value Date/Time   NA 139 05/22/2011 0335   K 3.9 05/22/2011 0335   CL 102 05/22/2011 0335   CO2 28 05/22/2011 0335   GLUCOSE 98 05/22/2011 0335   BUN 7 05/22/2011 0335   CREATININE 0.66 05/22/2011 0335   CALCIUM 9.0 05/22/2011 0335   PROT 6.4 05/18/2011 0425   ALBUMIN 3.0* 05/18/2011 0425   AST 11 05/18/2011 0425   ALT 17 05/18/2011 0425   ALKPHOS 54 05/18/2011 0425   BILITOT 0.4 05/18/2011 0425   GFRNONAA >90 05/22/2011 0335   GFRAA >90 05/22/2011 0335     Intake/Output Summary (Last 24 hours) at 05/25/11 1213 Last data filed at 05/25/11 1100  Gross per 24 hour    Intake   4637 ml  Output   3825 ml  Net    812 ml   Last BM - 05/25/11  Weight Status:   5/12 201 lb 5/19 199 lb 4.7 oz  Re-estimated needs: No changes. 2300-2600 calories 120-135g protein  Nutrition Dx: Inadequate oral intake - ongoing  Goal:  1. TF tolerance - met 2. TF to meet estimated nutritional needs - met  Intervention: Continue current TF plan then resume home TF at discharge.   Monitor: Weights, labs, TF tolerance, BM, nausea   Marshall Cork Pager #: [phone]

## 2011-05-25 NOTE — Plan of Care (Signed)
Problem: Inadequate Intake (NI-2.1) Goal: Food and/or nutrient delivery Individualized approach for food/nutrient provision.  Outcome: Completed/Met Date Met:  05/25/11 Still with minimal PO intake but current TF of Osmolite 1.2 at 59ml/hr via PEG meeting 100% of estimated nutritional needs

## 2011-05-26 ENCOUNTER — Other Ambulatory Visit: Payer: Self-pay | Admitting: Oncology

## 2011-05-26 DIAGNOSIS — C099 Malignant neoplasm of tonsil, unspecified: Secondary | ICD-10-CM

## 2011-05-26 LAB — CBC
HCT: 26.4 % — ABNORMAL LOW (ref 39.0–52.0)
MCH: 31.1 pg (ref 26.0–34.0)
MCV: 93.3 fL (ref 78.0–100.0)
RBC: 2.83 MIL/uL — ABNORMAL LOW (ref 4.22–5.81)
RDW: 15.2 % (ref 11.5–15.5)
WBC: 1.5 10*3/uL — ABNORMAL LOW (ref 4.0–10.5)

## 2011-05-26 MED ORDER — GUAIFENESIN 100 MG/5ML PO SOLN: 10.0000 mL | ORAL | Status: DC | PRN

## 2011-05-26 MED ORDER — FENTANYL 75 MCG/HR TD PT72: 1.0000 | MEDICATED_PATCH | TRANSDERMAL | Status: DC

## 2011-05-26 MED ORDER — OSMOLITE 1.2 CAL PO LIQD: 1000.0000 mL | ORAL | Status: DC

## 2011-05-26 NOTE — Discharge Summary (Signed)
Physician Discharge Summary   Patient ID: Kyle Hendrix [id number] y.o. Jul 26, 1965  Admit date: 05/17/2011  Discharge date and time: 05/26/2011  Admitting Physician: Dorothea Ogle, MD   Discharge Physician: Jethro Bolus, MD  Admission Diagnoses: Cancer [199.1] Mucositis [528.00] Nausea & vomiting [787.01] Right tonsillar squamous cell carcinoma [146.0] NECK PAIN,CANCER PT  Discharge Diagnoses: mucositis, oropharynx cancer.   Admission Condition: fair  Discharged Condition: fair  Indication for Admission:  Pain control, neutropenic fever, mucositis, dehydration, nausea/vomiting.   Hospital Course:   1. Neutropenic fever:  - Neutropenia due to chemotherapy. He was afebrile during the entire admission.  His blood, urine culture remained negative on discharge. - His empiric antibiotics Vancomycin and Cefepime which were started on admission were discontinued on 05/20/11. He had 7 days total of empiric Levaquin (without reaction). His WBC has slightly improved upon discharge.  He did not require GCSF due to lack of severe infection and slight improvement of his neutropenia.   2. 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 S/p 2 doses of q3wk Cisplatin and daily radiation. He was finished with chemotherapy as the 3rd dose was canceled due to neutropenia, and patient's preference not to get the last dose.  He finished radiation on 05/25/2011.   3. Nausea/vomiting: He was on Zofran/Ativan/Compazine and per rectum Phenergan with adequate control of his nausea/vomiting upon discharge.   4. Mucositis/xerostomia: He was on salt/baking soda rinses and alternate with Robitussin for thick phlegm. He has been taking Robitussin mouth rinse to get out the thick phlegm.   5. Protein calorie malnutrition: He was evaluated by Nutrition and was tried on different feeding solutions.  Four days before discharge, he was  started on  Osmolyte via pump which was increased to 67ml/hour which he tolerates well.  Upon discharge, he preferred not to have the feeding pump but instead resuming bolus feeding.  6. Mucositis pain control: Fentanyl patch dose was increased to 75 mcg/hr changed every 3 days since he still required frequent dilaudid for breakthrough.  He tolerated and had response to Dilaudid liquid for breakthrough pain.  8. Anemia: Due to chemotherapy. He did not have active bleeding. No transfusion was indicated.   9. Skin burn from treatment:  It was grade 2.  Patient was evaluated by Rad Onc with recommendation for neosporin prn as it was severe enough for wound care consult.    Consults:  Radiation Oncology.  Nutrition   Significant Diagnostic Studies:  CXR on admission which was negative.   Treatments: radiation, IVF, pain control.   Discharge Exam:  Gen: Well-nourished man, stronger appearing, in good spirit.  Eyes: No scleral icterus or jaundice. ENT: There was no oropharyngeal mass but positive for erythema but resolution of oral thrush. Neck was supple without thyromegaly. Lymphatics: Negative for cervical, supraclavicular, axillary, or inguinal adenopathy. Skin in the neck was red but without opened wound. Respiratory: Lungs were clear bilaterally without wheezing or crackles. Cardiovascular: normal heart rate and rhythm; S1/S2; without murmur, rubs, or gallop. There was no pedal edema. GI: Abdomen was soft, flat, nontender, nondistended, without organomegaly. PEG tube was without erythema, purulent discharge, or pain. Musculoskeletal exam: No spinal tenderness on palpation of vertebral spine. Skin exam was without ecchymosis, petechiae. Neuro exam was nonfocal. Patient was alert and oriented. Attention was good. Language was appropriate. Mood was normal without depression. Speech was not pressured. Thought content was not tangential.  Disposition: 01-Home or Self Care  Patient Instructions:    Medication List  As of 05/26/2011  9:47 AM   STOP taking these medications         fentaNYL 25 MCG/HR      HYDROcodone-acetaminophen 7.5-500 MG/15ML solution      IMODIUM PO      oxyCODONE 5 MG/5ML solution         TAKE these medications         Diphenhyd-Hydrocort-Nystatin Susp   Use as directed 15 mLs in the mouth or throat 4 (four) times daily as needed (Swish, gargle and spit as needed for mouth sores).      feeding supplement (OSMOLITE 1.2 CAL) Liqd   Place 1,000 mLs into feeding tube continuous.      fentaNYL 75 MCG/HR   Commonly known as: DURAGESIC - dosed mcg/hr   Place 1 patch (75 mcg total) onto the skin every 3 (three) days.      guaiFENesin 100 MG/5ML Soln   Commonly known as: ROBITUSSIN   Take 10 mLs (200 mg total) by mouth every 3 (three) hours as needed.      HYDROmorphone HCl 1 MG/ML Liqd   Commonly known as: DILAUDID   Take 1 mL (1 mg total) by mouth every 4 (four) hours as needed (break through pain. ).      lisdexamfetamine 70 MG capsule   Commonly known as: VYVANSE   Take 70 mg by mouth every morning.      LORazepam 0.5 MG tablet   Commonly known as: ATIVAN   Take 1 tablet (0.5 mg total) by mouth every 6 (six) hours as needed (Nausea or vomiting).      meloxicam 15 MG tablet   Commonly known as: MOBIC   Take 15 mg by mouth daily as needed.      omeprazole 20 MG capsule   Commonly known as: PRILOSEC   Take 1 capsule (20 mg total) by mouth daily.      ondansetron 8 MG disintegrating tablet   Commonly known as: ZOFRAN-ODT   8 mg Every 8 hours as needed.      polyethylene glycol packet   Commonly known as: MIRALAX / GLYCOLAX   Take 17 g by mouth daily.      prochlorperazine 10 MG tablet   Commonly known as: COMPAZINE   Take 10 mg by mouth every 6 (six) hours as needed.      promethazine 25 MG suppository   Commonly known as: PHENERGAN   Place 1 suppository (25 mg total) rectally 2 (two) times daily as needed for nausea.      sertraline  100 MG tablet   Commonly known as: ZOLOFT   Take 100 mg by mouth at bedtime.           Activity: activity as tolerated Diet: encourage fluids; PEG tube feeding with Osmolyte 1.5 using about 1.5 cans each time/ 4 x daily.  He was instructed to contact Vernell Leep, outpt Nutrition for advise on how to titrate up the feeding.  Wound Care: keep wound clean and dry of PEG tube site.   Follow-up with Clenton Pare, NP (Dr. Lodema Pilot NP) in about 7-10 days.   SignedJethro Bolus 05/26/2011 9:47 AM

## 2011-05-27 ENCOUNTER — Encounter: Payer: Self-pay | Admitting: Oncology

## 2011-05-27 NOTE — Treatment Plan (Signed)
[facility] END OF TREATMENT   Name: AVANEESH PEPITONE Date: 05/27/2011 MRN: [id number] DOB: [birth year]   TREATMENT DATES:   04/07/11 to 04/28/11   REFERRING PHYSICIAN:  Dr. Flo Shanks, M.D.  DIAGNOSIS: right tonsil squamous cell carcinoma; HPV positive; never smoker, but past history of chewing tobacco.     STAGE AT START OF TREATMENT: cT1 N2a M0 (stage IVA)    INTENT:Curative   DRUGS OR REGIMENS GIVEN:  Concurrent chemoradiation with q3wk Cisplatin 100mg /m2 and daily radiation. Cisplatin was started on 04/07/11.    MAJOR TOXICITIES:  Grade 3 nausea/vomiting, grade 3 mucositis, grade 3 skin burn; grade 2 neutropenia, grade 2 anorexia/weight loss.    REASON TREATMENT STOPPED: he did not receive the 3rd and final cycle of chemo due to neutropenic fever.    PERFORMANCE STATUS AT END: ECOG 1-2   ONGOING PROBLEMS:  Fatigue, mucositis, weight loss, anorexia.    FOLLOW UP PLANS:  Within 2 weeks.

## 2011-05-28 ENCOUNTER — Encounter: Payer: Self-pay | Admitting: Oncology

## 2011-05-28 ENCOUNTER — Telehealth: Payer: Self-pay | Admitting: *Deleted

## 2011-05-28 NOTE — Progress Notes (Signed)
Patient received one prescription from [facility] on 05/27/11 $9.88,his remaninig balance [facility] $141.08.

## 2011-05-28 NOTE — Telephone Encounter (Signed)
My preference is no.  I do not want him to go on prophylactic antibiotic and then get Cdif.  If patient himself develops symptoms, then he should let us know.  Thanks.

## 2011-05-28 NOTE — Telephone Encounter (Signed)
Called pt w/ Dr. Lodema Pilot response.  Instructed pt to call us back if any symptoms or fevers and drink plenty of fluids.  Pt verbalized understanding.  He states his son is staying w/ grandparents for the next few days.

## 2011-05-28 NOTE — Telephone Encounter (Signed)
Pt left VM states his 44 yr old son has been diagnosed w/ Strep Throat today.  Pt denies any fevers, but says he has been exposed and wondering if he should start any antibiotics?

## 2011-05-29 ENCOUNTER — Telehealth: Payer: Self-pay | Admitting: Oncology

## 2011-05-29 NOTE — Telephone Encounter (Signed)
pt called and scheduled appt for 05/30

## 2011-06-02 ENCOUNTER — Telehealth: Payer: Self-pay | Admitting: *Deleted

## 2011-06-02 ENCOUNTER — Emergency Department (HOSPITAL_COMMUNITY): Payer: BC Managed Care – PPO

## 2011-06-02 ENCOUNTER — Inpatient Hospital Stay (HOSPITAL_COMMUNITY)
Admission: EM | Admit: 2011-06-02 | Discharge: 2011-06-11 | DRG: 813 | Disposition: A | Discharge status: Home / Self Care | Payer: BC Managed Care – PPO | Attending: Internal Medicine | Admitting: Internal Medicine

## 2011-06-02 ENCOUNTER — Encounter (HOSPITAL_COMMUNITY): Payer: Self-pay | Admitting: Emergency Medicine

## 2011-06-02 DIAGNOSIS — T451X5A Adverse effect of antineoplastic and immunosuppressive drugs, initial encounter: Secondary | ICD-10-CM | POA: Diagnosis present

## 2011-06-02 DIAGNOSIS — Z9221 Personal history of antineoplastic chemotherapy: Secondary | ICD-10-CM

## 2011-06-02 DIAGNOSIS — K117 Disturbances of salivary secretion: Secondary | ICD-10-CM | POA: Diagnosis present

## 2011-06-02 DIAGNOSIS — D638 Anemia in other chronic diseases classified elsewhere: Secondary | ICD-10-CM | POA: Diagnosis present

## 2011-06-02 DIAGNOSIS — R5381 Other malaise: Secondary | ICD-10-CM | POA: Diagnosis present

## 2011-06-02 DIAGNOSIS — R1310 Dysphagia, unspecified: Secondary | ICD-10-CM | POA: Diagnosis present

## 2011-06-02 DIAGNOSIS — Z931 Gastrostomy status: Secondary | ICD-10-CM

## 2011-06-02 DIAGNOSIS — K208 Other esophagitis without bleeding: Secondary | ICD-10-CM | POA: Diagnosis present

## 2011-06-02 DIAGNOSIS — C099 Malignant neoplasm of tonsil, unspecified: Secondary | ICD-10-CM | POA: Diagnosis present

## 2011-06-02 DIAGNOSIS — F329 Major depressive disorder, single episode, unspecified: Secondary | ICD-10-CM | POA: Diagnosis present

## 2011-06-02 DIAGNOSIS — E860 Dehydration: Secondary | ICD-10-CM

## 2011-06-02 DIAGNOSIS — R29810 Facial weakness: Secondary | ICD-10-CM | POA: Diagnosis not present

## 2011-06-02 DIAGNOSIS — R112 Nausea with vomiting, unspecified: Secondary | ICD-10-CM

## 2011-06-02 DIAGNOSIS — D72819 Decreased white blood cell count, unspecified: Secondary | ICD-10-CM | POA: Diagnosis present

## 2011-06-02 DIAGNOSIS — H532 Diplopia: Secondary | ICD-10-CM | POA: Diagnosis not present

## 2011-06-02 DIAGNOSIS — K2210 Ulcer of esophagus without bleeding: Secondary | ICD-10-CM | POA: Diagnosis present

## 2011-06-02 DIAGNOSIS — R5081 Fever presenting with conditions classified elsewhere: Secondary | ICD-10-CM

## 2011-06-02 DIAGNOSIS — F3289 Other specified depressive episodes: Secondary | ICD-10-CM | POA: Diagnosis present

## 2011-06-02 DIAGNOSIS — R42 Dizziness and giddiness: Secondary | ICD-10-CM | POA: Diagnosis not present

## 2011-06-02 DIAGNOSIS — D709 Neutropenia, unspecified: Secondary | ICD-10-CM

## 2011-06-02 DIAGNOSIS — D696 Thrombocytopenia, unspecified: Secondary | ICD-10-CM

## 2011-06-02 DIAGNOSIS — Z923 Personal history of irradiation: Secondary | ICD-10-CM

## 2011-06-02 DIAGNOSIS — Z6828 Body mass index (BMI) 28.0-28.9, adult: Secondary | ICD-10-CM

## 2011-06-02 DIAGNOSIS — C801 Malignant (primary) neoplasm, unspecified: Secondary | ICD-10-CM

## 2011-06-02 DIAGNOSIS — J45909 Unspecified asthma, uncomplicated: Secondary | ICD-10-CM | POA: Diagnosis present

## 2011-06-02 DIAGNOSIS — E871 Hypo-osmolality and hyponatremia: Secondary | ICD-10-CM

## 2011-06-02 DIAGNOSIS — R51 Headache: Secondary | ICD-10-CM | POA: Diagnosis not present

## 2011-06-02 DIAGNOSIS — E440 Moderate protein-calorie malnutrition: Secondary | ICD-10-CM | POA: Diagnosis present

## 2011-06-02 DIAGNOSIS — R413 Other amnesia: Secondary | ICD-10-CM | POA: Diagnosis present

## 2011-06-02 DIAGNOSIS — C090 Malignant neoplasm of tonsillar fossa: Secondary | ICD-10-CM

## 2011-06-02 DIAGNOSIS — R531 Weakness: Secondary | ICD-10-CM | POA: Diagnosis present

## 2011-06-02 DIAGNOSIS — G4730 Sleep apnea, unspecified: Secondary | ICD-10-CM | POA: Diagnosis present

## 2011-06-02 LAB — COMPREHENSIVE METABOLIC PANEL
Albumin: 4 g/dL (ref 3.5–5.2)
Alkaline Phosphatase: 74 U/L (ref 39–117)
BUN: 7 mg/dL (ref 6–23)
CO2: 29 mEq/L (ref 19–32)
Chloride: 96 mEq/L (ref 96–112)
Creatinine, Ser: 0.81 mg/dL (ref 0.50–1.35)
GFR calc Af Amer: >90 mL/min (ref >90)
GFR calc non Af Amer: >90 mL/min (ref >90)
Glucose, Bld: 110 mg/dL — ABNORMAL HIGH (ref 70–99)
Potassium: 4.1 mEq/L (ref 3.5–5.1)
Total Bilirubin: 0.2 mg/dL — ABNORMAL LOW (ref 0.3–1.2)

## 2011-06-02 LAB — URINALYSIS, ROUTINE W REFLEX MICROSCOPIC
Bilirubin Urine: NEGATIVE
Ketones, ur: NEGATIVE mg/dL
Nitrite: NEGATIVE
Protein, ur: NEGATIVE mg/dL
pH: 8 (ref 5.0–8.0)

## 2011-06-02 LAB — CBC
Hemoglobin: 10.3 g/dL — ABNORMAL LOW (ref 13.0–17.0)
MCH: 31.6 pg (ref 26.0–34.0)
MCHC: 34.6 g/dL (ref 30.0–36.0)
MCV: 91.4 fL (ref 78.0–100.0)
RBC: 3.26 MIL/uL — ABNORMAL LOW (ref 4.22–5.81)

## 2011-06-02 LAB — DIFFERENTIAL
Basophils Relative: 1 % (ref 0–1)
Eosinophils Absolute: 0 10*3/uL (ref 0.0–0.7)
Eosinophils Relative: 2 % (ref 0–5)
Lymphs Abs: 0.2 10*3/uL — ABNORMAL LOW (ref 0.7–4.0)
Monocytes Absolute: 0.5 10*3/uL (ref 0.1–1.0)
Neutro Abs: 1 10*3/uL — ABNORMAL LOW (ref 1.7–7.7)
Neutrophils Relative %: 52 % (ref 43–77)

## 2011-06-02 LAB — LIPASE, BLOOD: Lipase: 26 U/L (ref 11–59)

## 2011-06-02 MED ORDER — SERTRALINE HCL 20 MG/ML PO CONC
100.0000 mg | Freq: Every day | ORAL | Status: DC
  Administered 2011-06-02 – 2011-06-10 (×9): 100 mg
  Filled 2011-06-02 (×10): qty 5

## 2011-06-02 MED ORDER — ENOXAPARIN SODIUM 40 MG/0.4ML ~~LOC~~ SOLN
40.0000 mg | SUBCUTANEOUS | Status: DC
  Administered 2011-06-02 – 2011-06-10 (×9): 40 mg via SUBCUTANEOUS
  Filled 2011-06-02 (×10): qty 0.4

## 2011-06-02 MED ORDER — OXYCODONE HCL 20 MG/ML PO CONC
10.0000 mg | ORAL | Status: DC | PRN
  Administered 2011-06-03 – 2011-06-05 (×6): 10 mg
  Administered 2011-06-07: 04:00:00
  Administered 2011-06-09 – 2011-06-10 (×5): 10 mg
  Filled 2011-06-02 (×13): qty 1

## 2011-06-02 MED ORDER — ENSURE COMPLETE PO LIQD: 237.0000 mL | Freq: Three times a day (TID) | ORAL | Status: DC

## 2011-06-02 MED ORDER — LORAZEPAM 0.5 MG PO TABS
0.5000 mg | ORAL_TABLET | Freq: Four times a day (QID) | ORAL | Status: DC | PRN
  Filled 2011-06-02: qty 1

## 2011-06-02 MED ORDER — LORAZEPAM 2 MG/ML IJ SOLN
0.5000 mg | Freq: Four times a day (QID) | INTRAMUSCULAR | Status: DC | PRN
  Administered 2011-06-02 – 2011-06-10 (×12): 0.5 mg via INTRAVENOUS
  Filled 2011-06-02 (×13): qty 1

## 2011-06-02 MED ORDER — SODIUM CHLORIDE 0.9 % IV SOLN
INTRAVENOUS | Status: DC
  Administered 2011-06-02 – 2011-06-09 (×15): via INTRAVENOUS
  Administered 2011-06-10: 1000 mL via INTRAVENOUS
  Administered 2011-06-10: 14:00:00 via INTRAVENOUS
  Administered 2011-06-10 – 2011-06-11 (×2): 1000 mL via INTRAVENOUS

## 2011-06-02 MED ORDER — SODIUM CHLORIDE 0.9 % IV BOLUS (SEPSIS)
1000.0000 mL | Freq: Once | INTRAVENOUS | Status: AC
  Administered 2011-06-02: 1000 mL via INTRAVENOUS

## 2011-06-02 MED ORDER — OSMOLITE 1.2 CAL PO LIQD: 1000.0000 mL | ORAL | Status: DC

## 2011-06-02 MED ORDER — DOCUSATE SODIUM 100 MG PO CAPS
100.0000 mg | ORAL_CAPSULE | Freq: Two times a day (BID) | ORAL | Status: DC
  Filled 2011-06-02 (×19): qty 1

## 2011-06-02 MED ORDER — ACETAMINOPHEN 650 MG RE SUPP: 650.0000 mg | Freq: Four times a day (QID) | RECTAL | Status: DC | PRN

## 2011-06-02 MED ORDER — LORAZEPAM 2 MG/ML PO CONC: 0.5000 mg | Freq: Four times a day (QID) | ORAL | Status: DC | PRN

## 2011-06-02 MED ORDER — GUAIFENESIN 100 MG/5ML PO SOLN
10.0000 mL | ORAL | Status: DC | PRN
  Administered 2011-06-02 – 2011-06-10 (×28): 200 mg
  Filled 2011-06-02 (×31): qty 10

## 2011-06-02 MED ORDER — PROMETHAZINE HCL 25 MG RE SUPP
25.0000 mg | Freq: Four times a day (QID) | RECTAL | Status: DC | PRN
  Administered 2011-06-02 – 2011-06-08 (×13): 25 mg via RECTAL
  Filled 2011-06-02 (×17): qty 1

## 2011-06-02 MED ORDER — PROCHLORPERAZINE 25 MG RE SUPP
25.0000 mg | Freq: Three times a day (TID) | RECTAL | Status: AC
  Administered 2011-06-02 – 2011-06-06 (×10): 25 mg via RECTAL
  Filled 2011-06-02 (×15): qty 1

## 2011-06-02 MED ORDER — OSMOLITE 1.2 CAL PO LIQD
1000.0000 mL | ORAL | Status: DC
  Administered 2011-06-02 – 2011-06-04 (×2): 1000 mL

## 2011-06-02 MED ORDER — ONDANSETRON HCL 4 MG/2ML IJ SOLN
4.0000 mg | Freq: Once | INTRAMUSCULAR | Status: AC
  Administered 2011-06-02: 4 mg via INTRAVENOUS
  Filled 2011-06-02: qty 2

## 2011-06-02 MED ORDER — PANTOPRAZOLE SODIUM 40 MG PO PACK
40.0000 mg | PACK | Freq: Every day | ORAL | Status: DC
  Administered 2011-06-02 – 2011-06-10 (×9): 40 mg
  Filled 2011-06-02 (×15): qty 20

## 2011-06-02 MED ORDER — DIPHENHYD-HYDROCORT-NYSTATIN MT SUSP: 15.0000 mL | Freq: Four times a day (QID) | OROMUCOSAL | Status: DC | PRN

## 2011-06-02 MED ORDER — MAGIC MOUTHWASH
15.0000 mL | ORAL | Status: DC | PRN
  Administered 2011-06-04 – 2011-06-07 (×2): 15 mL via ORAL
  Filled 2011-06-02: qty 15

## 2011-06-02 MED ORDER — ACETAMINOPHEN 325 MG PO TABS: 650.0000 mg | ORAL_TABLET | Freq: Four times a day (QID) | ORAL | Status: DC | PRN

## 2011-06-02 NOTE — ED Notes (Signed)
Pt states he has throat ca, and throat hurts to the point that he cannot take anything po.  Pt state he has a dry cough since he was discharged a week ago after finishing radiation treatments.  Pt states he has nauesa and is on 3 nausea meds.  Pt states he feels more confused today.

## 2011-06-02 NOTE — Progress Notes (Signed)
Into unit via stretcher, alert, oriented, appears weak and pale, with much nausea as verbalized. Admission assessment done, see epic. Attended to needs.

## 2011-06-02 NOTE — ED Notes (Signed)
Patient transported to X-ray 

## 2011-06-02 NOTE — ED Notes (Signed)
Pt. Was assisted with orthostatics with standing.  Pt. Stated that" I felt a little dizzy".

## 2011-06-02 NOTE — ED Provider Notes (Signed)
History     CSN: [id number]  Arrival date & time 06/02/11  4098   First MD Initiated Contact with Patient 06/02/11 [phone]      Chief Complaint  Patient presents with  . Dehydration  . Altered Mental Status    (Consider location/radiation/quality/duration/timing/severity/associated sxs/prior treatment) HPI Pt with history of tonsillar CA s/p radiation and chemo d/c'd from hospital 1 week ago after admission for neutropenic fever. Since pt has had increasing generalized weakness, nausea, non-productive cough and confusion since his discharge. Pt is not taking any PO's. G-tube in place. Called his oncologist, Dr Lodema Pilot office and referred to ED for re-hydration.  Past Medical History  Diagnosis Date  . Nasal polyps   . 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 Depression   . Fatigue   . Degeneration of lumbar intervertebral disc   . Allergy     seasonal  . Asthma     seasonal  . 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 TMJ (dislocation of temporomandibular joint)   . Sleep apnea     Past Surgical History  Procedure Date  . Anterior release vertebral body w/ posterior fusion     lumbar  . Back surgery 09/1999    L4-5 fusion  . Multiple tooth extractions March 2013    6 teeth removed  . Peg placement March 2013    Placed at [facility]  . Panendoscopy 03/18/2011    Procedure: PANENDOSCOPY;  Surgeon: Flo Shanks, MD;  Location: [facility] OR;  Service: ENT;  Laterality: N/A;  PANDENDONOSCOPY  . Tonsillectomy 03/18/2011    Procedure: TONSILLECTOMY;  Surgeon: Flo Shanks, MD;  Location: [facility] OR;  Service: ENT;  Laterality: Right;    Family History  Problem Relation Age of Onset  . Diabetes Mother   . Hypertension Mother   . Stroke Maternal Grandfather     History  Substance Use Topics  . Smoking status: Never Smoker   . Smokeless tobacco: Former Neurosurgeon    Types: Chew    Quit date: 03/04/1999  . Alcohol Use: Yes     socially       Review of Systems  Constitutional: Positive for fatigue. Negative for fever and chills.  HENT: Positive for sore throat. Negative for neck pain and neck stiffness.   Respiratory: Negative for shortness of breath and wheezing.   Cardiovascular: Negative for chest pain.  Gastrointestinal: Positive for nausea. Negative for vomiting, abdominal pain and diarrhea.  Skin: Negative for rash.  Neurological: Positive for light-headedness. Negative for weakness, numbness and headaches.  Psychiatric/Behavioral: Positive for confusion. Negative for agitation.    Allergies  Contrast media; Iodine; Shellfish allergy; Betadine; Avelox; Morphine and related; Prednisone; and Ultram  Home Medications   Current Outpatient Rx  Name Route Sig Dispense Refill  . DIPHENHYD-HYDROCORT-NYSTATIN MT SUSP Mouth/Throat Use as directed 15 mLs in the mouth or throat 4 (four) times daily as needed (Swish, gargle and spit as needed for mouth sores). 500 mL 3  . GUAIFENESIN 100 MG/5ML PO SOLN Oral Take 10 mLs by mouth every 3 (three) hours as needed.    Marland Kitchen OMEPRAZOLE 20 MG PO CPDR Oral Take 1 capsule (20 mg total) by mouth daily. 30 capsule 2  . ONDANSETRON 8 MG PO TBDP  8 mg Every 8 hours as needed.    . OXYCODONE HCL 20 MG/ML PO CONC Oral Take 10 mg by mouth every 4 (four) hours as needed. Pain    . PROCHLORPERAZINE MALEATE  10 MG PO TABS Oral Take 10 mg by mouth every 6 (six) hours as needed.    Marland Kitchen PROMETHAZINE HCL 25 MG RE SUPP Rectal Place 1 suppository (25 mg total) rectally 2 (two) times daily as needed for nausea. 24 each 0  . LORAZEPAM 0.5 MG PO TABS Oral Take 1 tablet (0.5 mg total) by mouth every 6 (six) hours as needed (Nausea or vomiting). 30 tablet 0    BP 111/69  Pulse 100  Temp(Src) 98.7 F (37.1 C) (Oral)  Resp 19  SpO2 97%  Physical Exam  Nursing note and vitals reviewed. Constitutional: He is oriented to person, place, and time. He appears well-developed and well-nourished. No distress.   HENT:  Head: Normocephalic and atraumatic.  Mouth/Throat: Oropharynx is clear and moist.  Eyes: EOM are normal. Pupils are equal, round, and reactive to light.  Neck: Normal range of motion. Neck supple.  Cardiovascular: Regular rhythm.        tachycardia  Pulmonary/Chest: Effort normal and breath sounds normal. No respiratory distress. He has no wheezes. He has no rales.  Abdominal: Soft. Bowel sounds are normal. There is no tenderness. There is no rebound and no guarding.       g-tube without evidence of infection or displacement  Musculoskeletal: Normal range of motion. He exhibits no edema and no tenderness.  Lymphadenopathy:    He has no cervical adenopathy.  Neurological: He is alert and oriented to person, place, and time.       No focal neuro deficits  Skin: Skin is warm and dry. No rash noted. No erythema.  Psychiatric: He has a normal mood and affect. His behavior is normal.    ED Course  Procedures (including critical care time)  Labs Reviewed  CBC - Abnormal; Notable for the following:    WBC 1.7 (*)    RBC 3.26 (*)    Hemoglobin 10.3 (*)    HCT 29.8 (*)    RDW 16.5 (*)    Platelets 491 (*)    All other components within normal limits  DIFFERENTIAL - Abnormal; Notable for the following:    Monocytes Relative 32 (*)    Neutro Abs 1.0 (*)    Lymphs Abs 0.2 (*)    All other components within normal limits  COMPREHENSIVE METABOLIC PANEL - Abnormal; Notable for the following:    Glucose, Bld 110 (*)    Total Bilirubin 0.2 (*)    All other components within normal limits  LIPASE, BLOOD  URINALYSIS, ROUTINE W REFLEX MICROSCOPIC   Dg Chest 2 View  06/02/2011  *RADIOLOGY REPORT*  Clinical Data: History of tonsillar carcinoma, some throat pain  CHEST - 2 VIEW  Comparison: Chest x-ray of 05/17/2011  Findings: The lungs are clear.  Mediastinal contours appear stable. The heart is within normal limits in size.  No bony abnormality is seen.  IMPRESSION: No active lung  disease.  Original Report Authenticated By: Juline Patch, M.D.     1. Nausea & vomiting   2. Dehydration   3. Neutropenia      Date: 06/02/2011  Rate: 92  Rhythm: normal sinus rhythm  QRS Axis: normal  Intervals: normal  ST/T Wave abnormalities: normal  Conduction Disutrbances:none  Narrative Interpretation:   Old EKG Reviewed: unchanged    MDM   Pt continues to vomit and remain tachycardic despite multiple dose of antiemetics and IVF's. Discussed with Oncology and recommend [facility] to admit if needed for symptomatic control  Discussed with [facility]. Will  see in ED.     Loren Racer, MD 06/02/11 [phone]

## 2011-06-02 NOTE — ED Notes (Signed)
MD at bedside. 

## 2011-06-02 NOTE — Telephone Encounter (Signed)
Returned call to pt's wife,  She left VM this morning c/o pt having increased coughing and n/v all weekend.  She denies pt has any fevers or diarrhea.  C/o a lot of mucous, coughing and gagging along w/ nausea and vomiting.  Instructed wife to make sure pt taking all 3 anti emetics,  Zofran, compazine and ativan ATC for ongoing n/v.Marland Kitchen  Also instructed on using mouth rinses ATC,  Alternating w/ Robitussin QID,  H2O2 w/ water QID, Baking soda and water QID and Salt water QID.Marland Kitchen  Instructed all rinses are swish, gargle and spit and can alternate, do a different rinse every 1 to 2 hrs to help w/ thick secretions.   Wife says she thinks pt is using his nausea meds and thinks he is doing some mouth/throat rinses.  She also c/o thinking pt is suffering from "hayfever."   She says he is able to drink plenty of fluid.  Instructed to make sure pt taking at least 1 liter of H20 via PEG or PO daily in addition to tube feedings.   Reported pt's symptoms to NP,  Clenton Pare,  Who is scheduled to see pt in 2 days but is able to see pt tomorrow if needed.  Wife reported that they are actually pulling up to ED at this moment and pt plans to be treated in ED.   Instructed wife to call back to f/u on pt's status later today.   She agreed.

## 2011-06-02 NOTE — H&P (Signed)
Kyle Hendrix MRN: [id number] DOB/AGE: 1965-02-21 46 y.o. 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 Patient was recently discharged from the hospital with nausea, emesis, neutropenic fevers, mucositis. Patient states he feels dehydrated. Patient denies any fevers, no chills, no chest pain, no shortness of breath, no wheezing, no diarrhea, no constipation, no dysuria. Patient does endorse some generalized weakness. Patient's father who is at bedside also states that patient's son was recently diagnosed with strep throat although he has been asymptomatic respiratory wise. Patient was seen in the ED CBC done was noted to have a white count of 1.7 otherwise was within normal limits. BMET  done was within normal limits, we were  called to admit the patient for further evaluation and management.  Past Medical History  Diagnosis Date  . Nasal polyps   . 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 Depression   . Fatigue   . Degeneration of lumbar intervertebral disc   . Allergy     seasonal  . Asthma     seasonal  . 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 TMJ (dislocation of temporomandibular joint)   . Sleep apnea   . Neutropenia 06/02/2011    Past Surgical History  Procedure Date  . Anterior release vertebral body w/ posterior fusion     lumbar  . Back surgery 09/1999    L4-5 fusion  . Multiple tooth extractions March 2013    6 teeth removed  .  Peg placement March 2013    Placed at [place] [facility]  . Panendoscopy 03/18/2011    Procedure: PANENDOSCOPY;  Surgeon: Flo Shanks, MD;  Location: [facility] OR;  Service: ENT;  Laterality: N/A;  PANDENDONOSCOPY  . Tonsillectomy 03/18/2011    Procedure: TONSILLECTOMY;  Surgeon: Flo Shanks, MD;  Location: [facility] OR;  Service: ENT;  Laterality: Right;    Prior to Admission medications   Medication Sig Start Date End Date Taking? Authorizing Provider  Diphenhyd-Hydrocort-Nystatin SUSP Use as directed 15 mLs in the mouth or throat 4 (four) times daily as needed (Swish, gargle and spit as needed for mouth sores). 04/17/11  Yes Exie Parody, MD  guaiFENesin (ROBITUSSIN) 100 MG/5ML SOLN Take 10 mLs by mouth every 3 (three) hours as needed. 05/26/11  Yes Exie Parody, MD  omeprazole (PRILOSEC) 20 MG capsule Take 1 capsule (20 mg total) by mouth daily. 04/24/11 04/23/12 Yes Myrtis Ser, NP  ondansetron (ZOFRAN-ODT) 8 MG disintegrating tablet 8 mg Every 8 hours as needed. 04/24/11  Yes Historical Provider, MD  oxyCODONE (ROXICODONE INTENSOL) 20 MG/ML concentrated solution Take 10 mg by mouth every 4 (four) hours as needed. Pain   Yes Historical Provider, MD  prochlorperazine (COMPAZINE) 10 MG tablet Take 10 mg by mouth every 6 (six) hours as needed.   Yes Historical Provider, MD  promethazine (PHENERGAN) 25 MG suppository Place 1 suppository (25 mg total) rectally 2 (two) times daily as needed for nausea. 05/01/11 06/16/11 Yes Exie Parody, MD  LORazepam (ATIVAN) 0.5 MG tablet Take 1 tablet (0.5 mg total) by  mouth every 6 (six) hours as needed (Nausea or vomiting). 05/08/11 11/04/11  Exie Parody, MD    Allergies:  Allergies  Allergen Reactions  . Contrast Media (Iodinated Diagnostic Agents) Anaphylaxis    Not recommended due to severe Iodine allergy.  . Iodine Anaphylaxis  . Phenergan (Promethazine Hcl) Itching  . Shellfish Allergy Anaphylaxis  . Betadine (Povidone Iodine) Rash  . Avelox (Moxifloxacin Hcl In Nacl) Hives  .  Morphine And Related Hives  . Prednisone Other (See Comments)    Altered mental status  . Ultram (Tramadol Hcl) Hives    Family History  Problem Relation Age of Onset  . Diabetes Mother   . Hypertension Mother   . Stroke Maternal Grandfather     Social History:  reports that he has never smoked. He quit smokeless tobacco use about 12 years ago. His smokeless tobacco use included Chew. He reports that he drinks alcohol. He reports that he does not use illicit drugs.  ROS: All systems reviewed with the patient and was positive as per HPI otherwise all other systems are negative.  PHYSICAL EXAM: Blood pressure 111/69, pulse 100, temperature 98.7 F (37.1 C), temperature source Oral, resp. rate 19, SpO2 97.00%. General: Well-developed well-nourished laying on a gurney with dry heaves and in no acute cardiopulmonary distress. Alert, awake, oriented x3, in no acute distress. HEENT: Normocephalic atraumatic. Pupils equal run reactive light and accommodation. Extraocular movements intact. Oropharynx is clear, no lesions no exudates neck is supple no lymphadenopathy. No bruits, no goiter. Heart: Tachycardia, without murmurs, rubs, gallops. Lungs: Clear to auscultation bilaterally. Abdomen: Soft, nontender, nondistended, positive bowel sounds. Extremities: No clubbing cyanosis or edema with positive pedal pulses. Neuro: Alert and oriented x3. Cranial nerves II through XII are grossly intact. No focal deficits. Grossly intact, nonfocal.    EKG: Normal sinus rhythm. Left anterior fascicular block  No results found for this or any previous visit (from the past 240 hour(s)).   Lab results:  [facility] 06/02/11 0915  NA 137  K 4.1  CL 96  CO2 29  GLUCOSE 110*  BUN 7  CREATININE 0.81  CALCIUM 9.9  MG --  PHOS --    Basename 06/02/11 0915  AST 17  ALT 22  ALKPHOS 74  BILITOT 0.2*  PROT 7.6  ALBUMIN 4.0    Basename 06/02/11 0915  LIPASE 26  AMYLASE --    Basename 06/02/11  0915  WBC 1.7*  NEUTROABS 1.0*  HGB 10.3*  HCT 29.8*  MCV 91.4  PLT 491*   No results found for this basename: CKTOTAL:3,CKMB:3,CKMBINDEX:3,TROPONINI:3 in the last 72 hours No components found with this basename: POCBNP:3 No results found for this basename: DDIMER in the last 72 hours No results found for this basename: HGBA1C:2 in the last 72 hours No results found for this basename: CHOL:2,HDL:2,LDLCALC:2,TRIG:2,CHOLHDL:2,LDLDIRECT:2 in the last 72 hours No results found for this basename: TSH,T4TOTAL,FREET3,T3FREE,THYROIDAB in the last 72 hours No results found for this basename: VITAMINB12:2,FOLATE:2,FERRITIN:2,TIBC:2,IRON:2,RETICCTPCT:2 in the last 72 hours Imaging results:  Dg Chest 2 View  06/02/2011  *RADIOLOGY REPORT*  Clinical Data: History of tonsillar carcinoma, some throat pain  CHEST - 2 VIEW  Comparison: Chest x-ray of 05/17/2011  Findings: The lungs are clear.  Mediastinal contours appear stable. The heart is within normal limits in size.  No bony abnormality is seen.  IMPRESSION: No active lung disease.  Original Report Authenticated By: Juline Patch, M.D.   Dg Chest 2 View  05/17/2011  *RADIOLOGY REPORT*  Clinical  Data: Fever.  Right tonsillar cancer.  Ongoing chemotherapy.  Asthma.  CHEST - 2 VIEW  Comparison: 04/23/2011  Findings: Linear subsegmental atelectasis noted in the left lower lobe.  Mildly low lung volumes are present.  Cardiac and mediastinal contours appear unremarkable.  No pleural effusion noted.  The lungs are otherwise clear.  IMPRESSION:  1.  Linear subsegmental atelectasis in the left lower lobe. Otherwise, no significant abnormality identified.  Original Report Authenticated By: Dellia Cloud, M.D.   Ct Soft Tissue Neck Wo Contrast  05/18/2011  *RADIOLOGY REPORT*  Clinical Data: Sore throat, body aches, low grade fever.  History of parotid tumor with radiation treatments.  Contrast allergy.  CT NECK WITHOUT CONTRAST  Technique:  Multidetector CT  imaging of the neck was performed without intravenous contrast.  Comparison: PET CT scan 02/25/2011  Findings: Enlarged lymph node behind the angle of the mandible on the right measures 1.7 x 1.7 cm.  There is central low attenuation suggesting necrosis.  This is smaller than on the previous study. There are diffusely scattered lymph nodes throughout the anterior and posterior cervical regions bilaterally which are not pathologically enlarged.  No significant tonsillar or mucosal swelling.  Salivary glands appear symmetrical.  Laryngeal structures appear symmetrical.  No new mass or lymphadenopathy is appreciated.  Degenerative changes in the cervical spine.  Reversal of the usual cervical lordosis, likely due to patient positioning. No prevertebral soft tissue swelling.  IMPRESSION: Mass in the right neck behind the angle of the mandible is decreasing in size.  No new mass, lymphadenopathy, or fluid collections identified.  Original Report Authenticated By: Marlon Pel, M.D.   Impression/Plan:  Principal Problem:  *Nausea & vomiting Active Problems:  Right tonsillar squamous cell carcinoma  Anemia of chronic disease  Dehydration  Neutropenia   #1 intractable nausea and vomiting Likely secondary to radiation treatments. Will admit the patient to MedSurg bed. We'll place on IV fluids, antiemetics, will place on scheduled Compazine suppositories, Phenergan suppositories, Ativan as needed. Start clear liquids. Supportive care. We'll consult with radiation oncology for further evaluation and management.  #2 dehydration Secondary to problem #1. We'll place on IV fluids. Continue patient's tube feeds with Osmolite.  #3 anemia of chronic disease H&H stable. No overt bleeding. Follow for now.  #4 neutropenia Slightly improved from last counts of 1.5 today patient's white count is 1.7. Patient is afebrile. Chest x-ray is negative. Urinalysis is negative. Will monitor for now. No need for  antibiotics at this time.  #5 history of right tonsillar squamous cell carcinoma status post chemotherapy and currently receiving radiation treatments Oncology has been informed of admission. Will consult with radiation oncology for further evaluation and management. We'll continue patient's PVCs with Osmolite.  #6 prophylaxis PPI for GI prophylaxis, Lovenox for DVT prophylaxis.  #7 CODE STATUS Full code   Anterio Scheel [phone] 06/02/2011, 5:09 PM

## 2011-06-03 ENCOUNTER — Ambulatory Visit
Admit: 2011-06-03 | Discharge: 2011-06-03 | Disposition: A | Discharge status: Home / Self Care | Payer: BC Managed Care – PPO | Attending: Radiation Oncology | Admitting: Radiation Oncology

## 2011-06-03 ENCOUNTER — Ambulatory Visit: Payer: BC Managed Care – PPO | Admitting: Radiation Oncology

## 2011-06-03 DIAGNOSIS — E860 Dehydration: Secondary | ICD-10-CM

## 2011-06-03 DIAGNOSIS — C090 Malignant neoplasm of tonsillar fossa: Secondary | ICD-10-CM

## 2011-06-03 DIAGNOSIS — D709 Neutropenia, unspecified: Secondary | ICD-10-CM

## 2011-06-03 LAB — CBC
HCT: 29.6 % — ABNORMAL LOW (ref 39.0–52.0)
MCV: 92.8 fL (ref 78.0–100.0)
Platelets: 482 10*3/uL — ABNORMAL HIGH (ref 150–400)
RBC: 3.19 MIL/uL — ABNORMAL LOW (ref 4.22–5.81)
WBC: 2.3 10*3/uL — ABNORMAL LOW (ref 4.0–10.5)

## 2011-06-03 LAB — BASIC METABOLIC PANEL
CO2: 26 mEq/L (ref 19–32)
Chloride: 107 mEq/L (ref 96–112)
GFR calc Af Amer: >90 mL/min (ref >90)
Sodium: 143 mEq/L (ref 135–145)

## 2011-06-03 NOTE — Progress Notes (Signed)
INITIAL ADULT NUTRITION ASSESSMENT Date: 06/03/2011   Time: 12:55 PM Reason for Assessment: Consult, nutrition risk   ASSESSMENT: Male 46 y.o.  Dx: Nausea & vomiting  Food/Nutrition Related Hx: Met with pt who reports since discharge on 05/26/11 he has only been able to get 4 cans of Ensure Plus via PEG instead of goal of 8 cans, however he has been able to eat some soups and drink fluids by mouth. Home TF of only 4 cans of Ensure Plus daily provides 1400 calories, 52g protein - meeting 60% of estimated calorie needs and 43% of estimated protein needs. On 06/02/11 pt became unable to eat/drink orally r/t nausea. 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 Pt states he feels "so-so" today, no vomiting, only c/o back pain r/t not liking the bed. RN reports pt with only 10ml of TF residuals today. Pt on Osmolite 1.2 via PEG currently at 28ml/hr - getting 1728 calories, 80g protein, meeting 75% of estimated calorie needs and 67% of estimated protein needs - goal is 28ml/hr. Pt's weight is down 4 pounds since admission earlier this month. Pt frustrated that he is on clear liquids, would like to be advanced to soft foods.   Hx:  Past Medical History  Diagnosis Date  . Nasal polyps   . 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 Depression   . Fatigue   . Degeneration of lumbar intervertebral disc   . Allergy     seasonal  . Asthma     seasonal  . 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 TMJ (dislocation of temporomandibular joint)   . Sleep apnea   . Neutropenia 06/02/2011   Related Meds:  Scheduled Meds:   . docusate sodium  100 mg Oral BID  . enoxaparin  40 mg Subcutaneous Q24H  . ondansetron (ZOFRAN) IV  4 mg Intravenous Once  . pantoprazole sodium  40 mg Per Tube QHS  . prochlorperazine  25 mg Rectal Q8H  . sertraline  100 mg Per Tube QHS  . sodium chloride  1,000 mL Intravenous Once  . DISCONTD: feeding supplement  237 mL  Oral TID WC   Continuous Infusions:   . sodium chloride 100 mL/hr at 06/03/11 0800  . feeding supplement (OSMOLITE 1.2 CAL) 1,000 mL (06/02/11 2200)  . DISCONTD: feeding supplement (OSMOLITE 1.2 CAL)     PRN Meds:.acetaminophen, acetaminophen, guaiFENesin, LORazepam, LORazepam, magic mouthwash, oxyCODONE, promethazine, DISCONTD: Diphenhyd-Hydrocort-Nystatin, DISCONTD: LORazepam  Ht: 5\' 8"  (172.7 cm)  Wt: 200 lb 3.2 oz (90.81 kg)  Ideal Wt: 154 lb % Ideal Wt: 129  Usual Wt: 236 lb when diagnosed with CA in February 2013 % Usual Wt: 85  Body mass index is 30.44 kg/(m^2). Obesity Class I  Labs:  CMP     Component Value Date/Time   NA 143 06/03/2011 0340   K 3.5 06/03/2011 0340   CL 107 06/03/2011 0340   CO2 26 06/03/2011 0340   GLUCOSE 107* 06/03/2011 0340   BUN 6 06/03/2011 0340   CREATININE 0.75 06/03/2011 0340   CALCIUM 8.9 06/03/2011 0340   PROT 7.6 06/02/2011 0915   ALBUMIN 4.0 06/02/2011 0915   AST 17 06/02/2011 0915   ALT 22 06/02/2011 0915   ALKPHOS 74 06/02/2011 0915   BILITOT 0.2* 06/02/2011 0915   GFRNONAA >90 06/03/2011 0340   GFRAA >90 06/03/2011 0340    Intake/Output Summary (Last 24 hours) at 06/03/11 1317 Last data filed at 06/03/11 1210  Gross per 24 hour  Intake   1945 ml  Output   3826 ml  Net  -1881 ml   Last BM -  06/01/11  Diet Order: Clear Liquid  Supplements/Tube Feeding: Osmolite 1.2 goal of 69ml/hr - will provide 2592 calories, 120g protein, and free water    IVF:    sodium chloride Last Rate: 100 mL/hr at 06/03/11 0800  feeding supplement (OSMOLITE 1.2 CAL) Last Rate: 1,000 mL (06/02/11 2200)  DISCONTD: feeding supplement (OSMOLITE 1.2 CAL)     Estimated Nutritional Needs:   Kcal: 2300-2600 Protein: 120-135g Fluid: 2.3-2.6L  NUTRITION DIAGNOSIS: -Inadequate oral intake (NI-2.1).  Status: Ongoing -Pt meets criteria for severe PCM of chronic illness AEB 15.2% weight loss in the past 3 months with <75% energy intake for the past  week  RELATED TO: nausea/vomiting, tonsillar CA on chemoradiation  AS EVIDENCE BY: H&P, reliance on PEG with TF to meet nutritional needs  MONITORING/EVALUATION(Goals): 1. Advance diet as tolerated to soft regular foods  2. TF tolerance and advancement 3. TF will meet >90% of estimated nutritional needs  EDUCATION NEEDS: -No education needs identified at this time  INTERVENTION: Diet advancement per MD. Will continue to monitor TF advancement and tolerance. Will monitor.   Dietitian #: [phone]  DOCUMENTATION CODES Per approved criteria  -Severe malnutrition in the context of chronic illness -Obesity Unspecified    Marshall Cork 06/03/2011, 12:55 PM

## 2011-06-03 NOTE — Progress Notes (Signed)
Radiation Oncology         ([phone]) [phone] ________________________________  Name: Kyle Hendrix MRN: [id number]  Date: 06/03/2011  DOB: 05/17/1965     Diagnosis:   Tonsillar cancer status post chemoradiotherapy  Interval Since Last Radiation:  9 days; the patient has completed his course of radiation treatment in its entirety.  Narrative:  The patient was seen today as an inpatient. He has been admitted with dehydration and nausea. The patient has felt somewhat better overall but is more nauseous later today. His primary complaint today is that of some thickened saliva. He is pleased with how his skin has healed and he has been able to decrease his use of pain medications as his pain has been improving in the oral cavity/oropharynx.                           ALLERGIES:  is allergic to contrast media; iodine; phenergan; shellfish allergy; betadine; avelox; morphine and related; prednisone; and ultram.  Meds: Current Facility-Administered Medications  Medication Dose Route Frequency Provider Last Rate Last Dose  . 0.9 %  sodium chloride infusion   Intravenous Continuous Rodolph Bong, MD 100 mL/hr at 06/03/11 1407    . acetaminophen (TYLENOL) tablet 650 mg  650 mg Oral Q6H PRN Rodolph Bong, MD       Or  . acetaminophen (TYLENOL) suppository 650 mg  650 mg Rectal Q6H PRN Rodolph Bong, MD      . docusate sodium (COLACE) capsule 100 mg  100 mg Oral BID Rodolph Bong, MD      . enoxaparin (LOVENOX) injection 40 mg  40 mg Subcutaneous Q24H Rodolph Bong, MD   40 mg at 06/02/11 2142  . feeding supplement (OSMOLITE 1.2 CAL) liquid 1,000 mL  1,000 mL Per Tube Continuous Rodolph Bong, MD 90 mL/hr at 06/02/11 2200 1,000 mL at 06/02/11 2200  . guaiFENesin (ROBITUSSIN) 100 MG/5ML solution 200 mg  10 mL Per Tube Q3H PRN Rodolph Bong, MD   200 mg at 06/03/11 1230  . LORazepam (ATIVAN) injection 0.5 mg  0.5 mg Intravenous Q6H PRN Rodolph Bong, MD   0.5 mg at 06/02/11  1942  . LORazepam (ATIVAN) tablet 0.5 mg  0.5 mg Oral Q6H PRN Rodolph Bong, MD      . magic mouthwash  15 mL Oral Q4H PRN Rodolph Bong, MD      . oxyCODONE (ROXICODONE INTENSOL) 20 MG/ML concentrated solution 10 mg  10 mg Per Tube Q4H PRN Rodolph Bong, MD   10 mg at 06/03/11 0305  . pantoprazole sodium (PROTONIX) 40 mg/20 mL oral suspension 40 mg  40 mg Per Tube QHS Rodolph Bong, MD   40 mg at 06/02/11 2142  . prochlorperazine (COMPAZINE) suppository 25 mg  25 mg Rectal Q8H Rodolph Bong, MD   25 mg at 06/03/11 1408  . promethazine (PHENERGAN) suppository 25 mg  25 mg Rectal Q6H PRN Loren Racer, MD   25 mg at 06/03/11 1946  . sertraline (ZOLOFT) 20 MG/ML concentrated solution 100 mg  100 mg Per Tube QHS Rodolph Bong, MD   100 mg at 06/02/11 2143    Physical Findings: The patient is in no acute distress. Patient is alert and oriented.   height is 5\' 8"  (1.727 m) and weight is 200 lb 3.2 oz (90.81 kg). His oral temperature is 98.2 F (36.8 C). His blood pressure  is 114/77 and his pulse is 101. His respiration is 18 and oxygen saturation is 96%. .   The patient's skin looks very good for having completed radiation as short time ago. No ongoing desquamation. Thickened saliva present within the oral cavity with much improved mucositis.  Lab Findings: Lab Results  Component Value Date   WBC 2.3* 06/03/2011   HGB 10.0* 06/03/2011   HCT 29.6* 06/03/2011   MCV 92.8 06/03/2011   PLT 482* 06/03/2011     Radiographic Findings: Dg Chest 2 View  06/02/2011  *RADIOLOGY REPORT*  Clinical Data: History of tonsillar carcinoma, some throat pain  CHEST - 2 VIEW  Comparison: Chest x-ray of 05/17/2011  Findings: The lungs are clear.  Mediastinal contours appear stable. The heart is within normal limits in size.  No bony abnormality is seen.  IMPRESSION: No active lung disease.  Original Report Authenticated By: Juline Patch, M.D.   Dg Chest 2 View  05/17/2011  *RADIOLOGY  REPORT*  Clinical Data: Fever.  Right tonsillar cancer.  Ongoing chemotherapy.  Asthma.  CHEST - 2 VIEW  Comparison: 04/23/2011  Findings: Linear subsegmental atelectasis noted in the left lower lobe.  Mildly low lung volumes are present.  Cardiac and mediastinal contours appear unremarkable.  No pleural effusion noted.  The lungs are otherwise clear.  IMPRESSION:  1.  Linear subsegmental atelectasis in the left lower lobe. Otherwise, no significant abnormality identified.  Original Report Authenticated By: Dellia Cloud, M.D.   Ct Soft Tissue Neck Wo Contrast  05/18/2011  *RADIOLOGY REPORT*  Clinical Data: Sore throat, body aches, low grade fever.  History of parotid tumor with radiation treatments.  Contrast allergy.  CT NECK WITHOUT CONTRAST  Technique:  Multidetector CT imaging of the neck was performed without intravenous contrast.  Comparison: PET CT scan 02/25/2011  Findings: Enlarged lymph node behind the angle of the mandible on the right measures 1.7 x 1.7 cm.  There is central low attenuation suggesting necrosis.  This is smaller than on the previous study. There are diffusely scattered lymph nodes throughout the anterior and posterior cervical regions bilaterally which are not pathologically enlarged.  No significant tonsillar or mucosal swelling.  Salivary glands appear symmetrical.  Laryngeal structures appear symmetrical.  No new mass or lymphadenopathy is appreciated.  Degenerative changes in the cervical spine.  Reversal of the usual cervical lordosis, likely due to patient positioning. No prevertebral soft tissue swelling.  IMPRESSION: Mass in the right neck behind the angle of the mandible is decreasing in size.  No new mass, lymphadenopathy, or fluid collections identified.  Original Report Authenticated By: Marlon Pel, M.D.    Assessment/ plan:    46 year old male status post chemoradiotherapy. He finished his final fraction of radiation on 05/25/2011. He has been admitted  with ongoing nausea/dehydration. Although the patient has returned to the hospital there is some improvement in terms of being able to swallow a little bit of water at this point and he has been able to decrease his use of pain medications.  -agree with supportive measures at this time including rehydration and anti-emetics. -Guaifenesin sometimes helps with thickened secretions/saliva although the patient does not feel that this has substantially helped him. This is available to him on a prn basis currently. -I've encouraged the patient to continue and if possible increase rinses with salt/soda solution. This does help to break up the thickened saliva. -Expect some continued improvement and some thinning of the saliva over the next couple of weeks. Often  the patients' side effects will get worse shortly after completing radiation before they improve and I believe that he is still in this window in which acute toxicity he is still playing a prominent role although something such as the skin have improved.     Radene Gunning, M.D., Ph.D.

## 2011-06-03 NOTE — Progress Notes (Signed)
Subjective: Kyle Hendrix is well-known to me from previous admissions for the same complaints. Pt states that he has only been able to last approximately 3-4 days without nausea. He states that his nausea was to the point where it caused him emesis and then he became dehydrated leading to an ER visit. He denies any abdominal or throat pain but does state that he has a place in the back of his throat which feels raw and seem to provoke his emesis.   Pt states that he feels much better today after hydration and is requesting that his diet be advanced to full liquids. Objective: Filed Vitals:   06/02/11 1745 06/02/11 1756 06/02/11 2055 06/03/11 0608  BP: 116/64  117/77 108/73  Pulse: 100  113 91  Temp: 98.8 F (37.1 C)  99.2 F (37.3 C) 98 F (36.7 C)  TempSrc: Oral  Oral Oral  Resp: 20  19 18   Height:  5\' 8"  (1.727 m)    Weight:  90.81 kg (200 lb 3.2 oz)    SpO2: 99%  100% 95%   Weight change:   Intake/Output Summary (Last 24 hours) at 06/03/11 1415 Last data filed at 06/03/11 1210  Gross per 24 hour  Intake   1945 ml  Output   3826 ml  Net  -1881 ml    General: Alert, awake, oriented x3, in no acute distress.  HEENT: [place]/AT PEERL, EOMI Neck: Trachea midline,  no masses, no thyromegal,y no JVD, no carotid bruit OROPHARYNX:  Moist, No exudate/ erythema/lesions. Specifically no evidence of oral candidiasis and no posterior pharyngeal lesions or exudates noted. Heart: Regular rate and rhythm, without murmurs, rubs, gallops, PMI non-displaced, no heaves or thrills on palpation.  Lungs: Clear to auscultation, no wheezing or rhonchi noted. No increased vocal fremitus resonant to percussion  Abdomen: Soft, nontender, nondistended, positive bowel sounds, no masses no hepatosplenomegaly noted..  Neuro: No focal neurological deficits noted cranial nerves II through XII grossly intact. DTRs 2+ bilaterally upper and lower extremities. Strength 5/5 in bilateral upper and lower  extremities. Musculoskeletal: No warm swelling or erythema around joints, no spinal tenderness noted.  Lab Results:  [facility] 06/03/11 0340 06/02/11 0915  NA 143 137  K 3.5 4.1  CL 107 96  CO2 26 29  GLUCOSE 107* 110*  BUN 6 7  CREATININE 0.75 0.81  CALCIUM 8.9 9.9  MG -- 2.0  PHOS -- --    Basename 06/02/11 0915  AST 17  ALT 22  ALKPHOS 74  BILITOT 0.2*  PROT 7.6  ALBUMIN 4.0    Basename 06/02/11 0915  LIPASE 26  AMYLASE --    Basename 06/03/11 0340 06/02/11 0915  WBC 2.3* 1.7*  NEUTROABS -- 1.0*  HGB 10.0* 10.3*  HCT 29.6* 29.8*  MCV 92.8 91.4  PLT 482* 491*   No results found for this basename: CKTOTAL:3,CKMB:3,CKMBINDEX:3,TROPONINI:3 in the last 72 hours No components found with this basename: POCBNP:3 No results found for this basename: DDIMER:2 in the last 72 hours No results found for this basename: HGBA1C:2 in the last 72 hours No results found for this basename: CHOL:2,HDL:2,LDLCALC:2,TRIG:2,CHOLHDL:2,LDLDIRECT:2 in the last 72 hours No results found for this basename: TSH,T4TOTAL,FREET3,T3FREE,THYROIDAB in the last 72 hours No results found for this basename: VITAMINB12:2,FOLATE:2,FERRITIN:2,TIBC:2,IRON:2,RETICCTPCT:2 in the last 72 hours  Micro Results: No results found for this or any previous visit (from the past 240 hour(s)).  Studies/Results: Dg Chest 2 View  06/02/2011  *RADIOLOGY REPORT*  Clinical Data: History of tonsillar carcinoma, some throat pain  CHEST - 2 VIEW  Comparison: Chest x-ray of 05/17/2011  Findings: The lungs are clear.  Mediastinal contours appear stable. The heart is within normal limits in size.  No bony abnormality is seen.  IMPRESSION: No active lung disease.  Original Report Authenticated By: Juline Patch, M.D.   Dg Chest 2 View  05/17/2011  *RADIOLOGY REPORT*  Clinical Data: Fever.  Right tonsillar cancer.  Ongoing chemotherapy.  Asthma.  CHEST - 2 VIEW  Comparison: 04/23/2011  Findings: Linear subsegmental atelectasis  noted in the left lower lobe.  Mildly low lung volumes are present.  Cardiac and mediastinal contours appear unremarkable.  No pleural effusion noted.  The lungs are otherwise clear.  IMPRESSION:  1.  Linear subsegmental atelectasis in the left lower lobe. Otherwise, no significant abnormality identified.  Original Report Authenticated By: Dellia Cloud, M.D.   Ct Soft Tissue Neck Wo Contrast  05/18/2011  *RADIOLOGY REPORT*  Clinical Data: Sore throat, body aches, low grade fever.  History of parotid tumor with radiation treatments.  Contrast allergy.  CT NECK WITHOUT CONTRAST  Technique:  Multidetector CT imaging of the neck was performed without intravenous contrast.  Comparison: PET CT scan 02/25/2011  Findings: Enlarged lymph node behind the angle of the mandible on the right measures 1.7 x 1.7 cm.  There is central low attenuation suggesting necrosis.  This is smaller than on the previous study. There are diffusely scattered lymph nodes throughout the anterior and posterior cervical regions bilaterally which are not pathologically enlarged.  No significant tonsillar or mucosal swelling.  Salivary glands appear symmetrical.  Laryngeal structures appear symmetrical.  No new mass or lymphadenopathy is appreciated.  Degenerative changes in the cervical spine.  Reversal of the usual cervical lordosis, likely due to patient positioning. No prevertebral soft tissue swelling.  IMPRESSION: Mass in the right neck behind the angle of the mandible is decreasing in size.  No new mass, lymphadenopathy, or fluid collections identified.  Original Report Authenticated By: Marlon Pel, M.D.    Medications: I have reviewed the patient's current medications. Scheduled Meds:   . docusate sodium  100 mg Oral BID  . enoxaparin  40 mg Subcutaneous Q24H  . ondansetron (ZOFRAN) IV  4 mg Intravenous Once  . pantoprazole sodium  40 mg Per Tube QHS  . prochlorperazine  25 mg Rectal Q8H  . sertraline  100 mg Per  Tube QHS  . sodium chloride  1,000 mL Intravenous Once  . DISCONTD: feeding supplement  237 mL Oral TID WC   Continuous Infusions:   . sodium chloride 100 mL/hr at 06/03/11 1407  . feeding supplement (OSMOLITE 1.2 CAL) 1,000 mL (06/02/11 2200)  . DISCONTD: feeding supplement (OSMOLITE 1.2 CAL)     PRN Meds:.acetaminophen, acetaminophen, guaiFENesin, LORazepam, LORazepam, magic mouthwash, oxyCODONE, promethazine, DISCONTD: Diphenhyd-Hydrocort-Nystatin, DISCONTD: LORazepam Assessment/Plan: Patient Active Hospital Problem List: Nausea & vomiting (05/02/2011)   Assessment: Patient continues to have recurrent nausea and vomiting despite being off of radiation and chemotherapy. While he was receiving treatment of his believe that this was likely secondary to side effects from these modalities. However in light of his ongoing nausea and vomiting I think a gastroenterological consult would be in order for further evaluation.    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   Anemia of chronic  disease (05/18/2011)   Assessment: Hemoglobin remains stable at 10.    Dehydration (06/02/2011)   Assessment: The patient presented in a state of dehydration secondary to poor oral intake combined with emesis. Today he clinically appears well-hydrated and reports that he is having good urine output. Presently I will continue his IV fluids as his oral intake is still marginal.    Neutropenia (06/02/2011)   Assessment: The patient presented a neutropenic state however his white blood cell count is improving and today it is 2.3. His absolute neutrophil count was 1000 yesterday. I will continue to monitor his WBCs and neutrophils on ongoing basis.       LOS: 1 day

## 2011-06-03 NOTE — Progress Notes (Signed)
Physical Therapy Evaluation Patient Details Name: Kyle Hendrix MRN: [id number] DOB: 1965/09/09 Today's Date: 06/03/2011 Time: 4098-1191 PT Time Calculation (min): 3 min  PT Assessment / Plan / Recommendation Clinical Impression       PT Assessment  Patent does not need any further PT services    Follow Up Recommendations  No PT follow up    Barriers to Discharge        lEquipment Recommendations       Recommendations for Other Services     Frequency      Precautions / Restrictions Restrictions Weight Bearing Restrictions: No   Pertinent Vitals/Pain *pt reports nausea**      Mobility       Exercises     PT Diagnosis:    PT Problem List:   PT Treatment Interventions:     PT Goals    Visit Information  Last PT Received On: 06/03/11 Reason Eval/Treat Not Completed:  (Pt stated he is independent with mobility)    Subjective Data  Subjective: Pt states he's been walking independently in the halls and doesn't need PT.    Prior Functioning       Cognition       Extremity/Trunk Assessment     Balance    End of Session     Tamala Ser 06/03/2011, 10:02 AM

## 2011-06-03 NOTE — Progress Notes (Signed)
Tube feeding currently a rate of 40cc/hr; checked residual which equaled 10cc and therefore increased the rate to 60cc/hr. Will continue to check residual and increase rate as able to reach goal of 90cc/hr.

## 2011-06-04 ENCOUNTER — Other Ambulatory Visit: Payer: Self-pay | Admitting: Lab

## 2011-06-04 ENCOUNTER — Encounter (HOSPITAL_COMMUNITY): Payer: Self-pay | Admitting: Gastroenterology

## 2011-06-04 ENCOUNTER — Ambulatory Visit: Payer: Self-pay | Admitting: Oncology

## 2011-06-04 DIAGNOSIS — E860 Dehydration: Secondary | ICD-10-CM

## 2011-06-04 DIAGNOSIS — R112 Nausea with vomiting, unspecified: Secondary | ICD-10-CM

## 2011-06-04 DIAGNOSIS — D709 Neutropenia, unspecified: Secondary | ICD-10-CM

## 2011-06-04 MED ORDER — ONDANSETRON HCL 4 MG/2ML IJ SOLN
INTRAMUSCULAR | Status: AC
  Administered 2011-06-04: 4 mg via INTRAVENOUS
  Filled 2011-06-04: qty 2

## 2011-06-04 MED ORDER — ONDANSETRON HCL 4 MG/2ML IJ SOLN
4.0000 mg | Freq: Three times a day (TID) | INTRAMUSCULAR | Status: DC | PRN
Start: 2011-06-04 — End: 2011-06-11
  Administered 2011-06-04 – 2011-06-10 (×10): 4 mg via INTRAVENOUS
  Filled 2011-06-04 (×11): qty 2

## 2011-06-04 MED ORDER — OSMOLITE 1.2 CAL PO LIQD
1000.0000 mL | ORAL | Status: AC
  Administered 2011-06-04: 1000 mL

## 2011-06-04 NOTE — Consult Note (Signed)
Reason for Consult: Nausea vomiting Referring Physician: Hospital team  Kyle Hendrix is an 46 y.o. male.  HPI: Patient status post chemotherapy radiation for head and neck cancer who had a PEG placed in March and with chemotherapy as periodic nausea and vomiting and had been tolerating bolus tube feeds at home however was put on continuous here but has been able to swallow lately but has had some increased nausea and vomiting which then caused some increased abdominal pain and we are consulted for further workup and plans. The computer chart was reviewed as was his x-rays and lab and he had no previous GI issues prior to his cancer diagnosis and no heartburn indigestion or acid reflux although Prevacid at home for the last week has helped some and he has no lower bowel complaints no GI problems run in his family  Past Medical History  Diagnosis Date  . Nasal polyps   . 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 Depression   . Fatigue   . Degeneration of lumbar intervertebral disc   . Allergy     seasonal  . Asthma     seasonal  . 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 TMJ (dislocation of temporomandibular joint)   . Sleep apnea   . Neutropenia 06/02/2011    Past Surgical History  Procedure Date  . Anterior release vertebral body w/ posterior fusion     lumbar  . Back surgery 09/1999    L4-5 fusion  . Multiple tooth extractions March 2013    6 teeth removed  . Peg placement March 2013    Placed at [place] [facility]  . Panendoscopy 03/18/2011    Procedure: PANENDOSCOPY;  Surgeon: Flo Shanks, MD;  Location: [facility] OR;  Service: ENT;  Laterality: N/A;  PANDENDONOSCOPY  . Tonsillectomy 03/18/2011    Procedure: TONSILLECTOMY;  Surgeon: Flo Shanks, MD;  Location: [facility] OR;  Service: ENT;  Laterality: Right;    Family History  Problem Relation Age of Onset  . Diabetes Mother   . Hypertension Mother   . Stroke Maternal Grandfather     Social  History:  reports that he has never smoked. He quit smokeless tobacco use about 12 years ago. His smokeless tobacco use included Chew. He reports that he drinks alcohol. He reports that he does not use illicit drugs.  Allergies:  Allergies  Allergen Reactions  . Contrast Media (Iodinated Diagnostic Agents) Anaphylaxis    Not recommended due to severe Iodine allergy.  . Iodine Anaphylaxis  . Phenergan (Promethazine Hcl) Itching  . Shellfish Allergy Anaphylaxis  . Betadine (Povidone Iodine) Rash  . Avelox (Moxifloxacin Hcl In Nacl) Hives  . Morphine And Related Hives  . Prednisone Other (See Comments)    Altered mental status  . Ultram (Tramadol Hcl) Hives    Medications: I have reviewed the patient's current medications.  Results for orders placed during the hospital encounter of 06/02/11 (from the past 48 hour(s))  BASIC METABOLIC PANEL     Status: Abnormal   Collection Time   06/03/11  3:40 AM      Component Value Range Comment   Sodium 143  135 - 145 (mEq/L)    Potassium 3.5  3.5 - 5.1 (mEq/L)    Chloride 107  96 - 112 (mEq/L)    CO2 26  19 - 32 (mEq/L)    Glucose, Bld 107 (*) 70 - 99 (mg/dL)    BUN 6  6 - 23 (mg/dL)  Creatinine, Ser 0.75  0.50 - 1.35 (mg/dL)    Calcium 8.9  8.4 - 10.5 (mg/dL)    GFR calc non Af Amer >90  >90 (mL/min)    GFR calc Af Amer >90  >90 (mL/min)   CBC     Status: Abnormal   Collection Time   06/03/11  3:40 AM      Component Value Range Comment   WBC 2.3 (*) 4.0 - 10.5 (K/uL)    RBC 3.19 (*) 4.22 - 5.81 (MIL/uL)    Hemoglobin 10.0 (*) 13.0 - 17.0 (g/dL)    HCT 14.7 (*) 82.9 - 52.0 (%)    MCV 92.8  78.0 - 100.0 (fL)    MCH 31.3  26.0 - 34.0 (pg)    MCHC 33.8  30.0 - 36.0 (g/dL)    RDW 56.2 (*) 13.0 - 15.5 (%)    Platelets 482 (*) 150 - 400 (K/uL)     No results found.  ROS negative except above his throat has been a little sore since his vomiting recurred Blood pressure 133/76, pulse 80, temperature 98.3 F (36.8 C), temperature source  Oral, resp. rate 16, height 5\' 8"  (1.727 m), weight 87.045 kg (191 lb 14.4 oz), SpO2 94.00%. Physical Exam vital signs stable afebrile no acute distress abdomen is soft nontender no obvious succussion splash well healed PEG site not too tight or too loose white count improved x-rays labs reviewed  Assessment/Plan: Nausea vomiting in a patient with head and neck cancer status post chemoradiation and PEG tube Plan: Consider endoscopy or CT scan tomorrow if he continues to have problems otherwise if feeling better can readvanced diet and see if he is able to go home soon  [facility] E 06/04/2011, 2:48 PM

## 2011-06-04 NOTE — Progress Notes (Signed)
Subjective: Patient had episode of emesis today and states that nausea still rather prominent. He continues to night any abdominal pain or any symptoms of reflux.   Objective: Filed Vitals:   06/03/11 1415 06/03/11 2055 06/04/11 0445 06/04/11 1330  BP: 114/77 123/80 129/79 133/76  Pulse: 101 85 82 80  Temp: 98.2 F (36.8 C) 98.1 F (36.7 C) 98.9 F (37.2 C) 98.3 F (36.8 C)  TempSrc: Oral Oral Oral Oral  Resp: 18 17 15 16   Height:      Weight:   87.045 kg (191 lb 14.4 oz)   SpO2: 96% 99% 98% 94%   Weight change: -3.765 kg (-8 lb 4.8 oz)  Intake/Output Summary (Last 24 hours) at 06/04/11 1814 Last data filed at 06/04/11 1341  Gross per 24 hour  Intake   1801 ml  Output   3350 ml  Net  -1549 ml    General: Alert, awake, oriented x3, in no acute distress.  HEENT: [place]/AT PEERL, EOMI Neck: Trachea midline,  no masses, no thyromegal,y no JVD, no carotid bruit OROPHARYNX:  Moist, No exudate/ erythema/lesions. Specifically no evidence of oral candidiasis and no posterior pharyngeal lesions or exudates noted.  Lungs: Clear to auscultation, no wheezing or rhonchi noted. No increased vocal fremitus resonant to percussion  Abdomen: Soft, nontender, nondistended, positive bowel sounds, no masses no hepatosplenomegaly noted..    Lab Results:  Basename 06/03/11 0340 06/02/11 0915  NA 143 137  K 3.5 4.1  CL 107 96  CO2 26 29  GLUCOSE 107* 110*  BUN 6 7  CREATININE 0.75 0.81  CALCIUM 8.9 9.9  MG -- 2.0  PHOS -- --    Basename 06/02/11 0915  AST 17  ALT 22  ALKPHOS 74  BILITOT 0.2*  PROT 7.6  ALBUMIN 4.0    Basename 06/02/11 0915  LIPASE 26  AMYLASE --    Basename 06/03/11 0340 06/02/11 0915  WBC 2.3* 1.7*  NEUTROABS -- 1.0*  HGB 10.0* 10.3*  HCT 29.6* 29.8*  MCV 92.8 91.4  PLT 482* 491*   No results found for this basename: CKTOTAL:3,CKMB:3,CKMBINDEX:3,TROPONINI:3 in the last 72 hours No components found with this basename: POCBNP:3 No results found for this  basename: DDIMER:2 in the last 72 hours No results found for this basename: HGBA1C:2 in the last 72 hours No results found for this basename: CHOL:2,HDL:2,LDLCALC:2,TRIG:2,CHOLHDL:2,LDLDIRECT:2 in the last 72 hours No results found for this basename: TSH,T4TOTAL,FREET3,T3FREE,THYROIDAB in the last 72 hours No results found for this basename: VITAMINB12:2,FOLATE:2,FERRITIN:2,TIBC:2,IRON:2,RETICCTPCT:2 in the last 72 hours  Micro Results: No results found for this or any previous visit (from the past 240 hour(s)).  Studies/Results: Dg Chest 2 View  06/02/2011  *RADIOLOGY REPORT*  Clinical Data: History of tonsillar carcinoma, some throat pain  CHEST - 2 VIEW  Comparison: Chest x-ray of 05/17/2011  Findings: The lungs are clear.  Mediastinal contours appear stable. The heart is within normal limits in size.  No bony abnormality is seen.  IMPRESSION: No active lung disease.  Original Report Authenticated By: Juline Patch, M.D.   Dg Chest 2 View  05/17/2011  *RADIOLOGY REPORT*  Clinical Data: Fever.  Right tonsillar cancer.  Ongoing chemotherapy.  Asthma.  CHEST - 2 VIEW  Comparison: 04/23/2011  Findings: Linear subsegmental atelectasis noted in the left lower lobe.  Mildly low lung volumes are present.  Cardiac and mediastinal contours appear unremarkable.  No pleural effusion noted.  The lungs are otherwise clear.  IMPRESSION:  1.  Linear subsegmental atelectasis in  the left lower lobe. Otherwise, no significant abnormality identified.  Original Report Authenticated By: Dellia Cloud, M.D.   Ct Soft Tissue Neck Wo Contrast  05/18/2011  *RADIOLOGY REPORT*  Clinical Data: Sore throat, body aches, low grade fever.  History of parotid tumor with radiation treatments.  Contrast allergy.  CT NECK WITHOUT CONTRAST  Technique:  Multidetector CT imaging of the neck was performed without intravenous contrast.  Comparison: PET CT scan 02/25/2011  Findings: Enlarged lymph node behind the angle of the  mandible on the right measures 1.7 x 1.7 cm.  There is central low attenuation suggesting necrosis.  This is smaller than on the previous study. There are diffusely scattered lymph nodes throughout the anterior and posterior cervical regions bilaterally which are not pathologically enlarged.  No significant tonsillar or mucosal swelling.  Salivary glands appear symmetrical.  Laryngeal structures appear symmetrical.  No new mass or lymphadenopathy is appreciated.  Degenerative changes in the cervical spine.  Reversal of the usual cervical lordosis, likely due to patient positioning. No prevertebral soft tissue swelling.  IMPRESSION: Mass in the right neck behind the angle of the mandible is decreasing in size.  No new mass, lymphadenopathy, or fluid collections identified.  Original Report Authenticated By: Marlon Pel, M.D.    Medications: I have reviewed the patient's current medications. Scheduled Meds:    . docusate sodium  100 mg Oral BID  . enoxaparin  40 mg Subcutaneous Q24H  . feeding supplement (OSMOLITE 1.2 CAL)  1,000 mL Per Tube Q24H  . pantoprazole sodium  40 mg Per Tube QHS  . prochlorperazine  25 mg Rectal Q8H  . sertraline  100 mg Per Tube QHS   Continuous Infusions:    . sodium chloride 100 mL/hr at 06/04/11 1130  . DISCONTD: feeding supplement (OSMOLITE 1.2 CAL) 1,000 mL (06/04/11 0021)   PRN Meds:.acetaminophen, acetaminophen, guaiFENesin, LORazepam, LORazepam, magic mouthwash, ondansetron (ZOFRAN) IV, oxyCODONE, promethazine Assessment/Plan: Patient Active Hospital Problem List: Nausea & vomiting (05/02/2011)   Assessment: Patient continues to have recurrent nausea and vomiting despite being off of radiation and chemotherapy. While he was receiving treatment of his believe that this was likely secondary to side effects from these modalities. I will obtain a gastroenterological evaluation for this patient.   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   Anemia of chronic disease (05/18/2011)   Assessment: Hemoglobin remains stable at 10.    Dehydration (06/02/2011)   Assessment: The patient presented in a state of dehydration secondary to poor oral intake combined with emesis. Today he clinically appears well-hydrated and reports that he is having good urine output. Presently I will continue his IV fluids as his oral intake is still marginal.    Neutropenia (06/02/2011)   Assessment: The patient presented a neutropenic state however his white blood cell count is improving and today it is 2.3. His absolute neutrophil count was 1000 yesterday. I will continue to monitor his WBCs and neutrophils on ongoing basis.       LOS: 2 days

## 2011-06-04 NOTE — Progress Notes (Signed)
Patient has been vomiting on and off today, not relieved by nausea med , ativan given also.Tube feeding was turned off around 1300.

## 2011-06-04 NOTE — Progress Notes (Signed)
Nutrition Brief Note  - Pt now at goal rate of Osmolite 1.2 at 60ml/hr via PEG which he reports to be tolerating well. Nursing reports no TF residuals this morning. Pt tried to eat soup, pudding, chocolate milk, and juice this morning on full liquid breakfast, however vomited them up within 30 minutes of eating. Pt given Ativan, pt falling asleep during visit. Will continue to monitor TF and diet tolerance/advancement.   Dietitian# [phone]

## 2011-06-04 NOTE — Progress Notes (Signed)
Nutrition Brief Note  - RN turned off TF earlier today due to pt vomiting for most of the day and notified MD that TF was off. Wife requested RD visit to discuss TF and oral nutrition. Wife stated that of all the recent admissions, this has been the worst for nausea/vomiting. Wife reports oncologist stated this nausea/vomiting not related to CA treatment. Wife reports she thinks pt would do better if he was able to eat regular food. Recommend trial of Osmolite 1.2 via PEG at 6ml/hr for 12 hours at night and regular soft food trial during the day to see how pt tolerates this and if it would improve PO intake. Pt states Phenergan and Compazine have been most effective at helping his nausea. Pt c/o some mild sore throat and sore on tongue, but states magic mouthwash has been helping with this. Noted possible plans for endoscopy tomorrow. Will monitor for nutrition plans.   Dietitian# [phone]

## 2011-06-04 NOTE — Progress Notes (Addendum)
Nutrition Brief Note  Discussed nutrition recommendation with MD. Pt and wife agreeable with plan. Wrote order for nighttime 12 hour continuous TF of Osmolite 1.2 at 45ml/hr and regular diet as tolerated. Discussed recommend bland foods on menu with pt and wife to prevent further nausea/vomiting. Pt currently feeling better and ready to start this plan. Will monitor.   Dietitian# [phone]

## 2011-06-05 ENCOUNTER — Inpatient Hospital Stay (HOSPITAL_COMMUNITY): Payer: BC Managed Care – PPO

## 2011-06-05 DIAGNOSIS — C090 Malignant neoplasm of tonsillar fossa: Secondary | ICD-10-CM

## 2011-06-05 LAB — DIFFERENTIAL
Eosinophils Absolute: 0 10*3/uL (ref 0.0–0.7)
Lymphocytes Relative: 22 % (ref 12–46)
Lymphs Abs: 0.5 10*3/uL — ABNORMAL LOW (ref 0.7–4.0)
Monocytes Relative: 24 % — ABNORMAL HIGH (ref 3–12)
Neutrophils Relative %: 52 % (ref 43–77)

## 2011-06-05 LAB — CBC
Hemoglobin: 9.6 g/dL — ABNORMAL LOW (ref 13.0–17.0)
MCH: 31.8 pg (ref 26.0–34.0)
MCV: 92.7 fL (ref 78.0–100.0)
RBC: 3.02 MIL/uL — ABNORMAL LOW (ref 4.22–5.81)
WBC: 2.4 10*3/uL — ABNORMAL LOW (ref 4.0–10.5)

## 2011-06-05 MED ORDER — OSMOLITE 1.2 CAL PO LIQD: 1000.0000 mL | ORAL | Status: DC

## 2011-06-05 MED ORDER — ATROPINE SULFATE 1 % OP SOLN
4.0000 [drp] | Freq: Four times a day (QID) | OPHTHALMIC | Status: DC | PRN
  Administered 2011-06-05 – 2011-06-07 (×3): 4 [drp] via SUBLINGUAL
  Filled 2011-06-05: qty 2

## 2011-06-05 MED ORDER — SUCRALFATE 1 GM/10ML PO SUSP
1.0000 g | Freq: Three times a day (TID) | ORAL | Status: DC
  Administered 2011-06-05 – 2011-06-11 (×20): 1 g via ORAL
  Filled 2011-06-05 (×28): qty 10

## 2011-06-05 MED ORDER — OSMOLITE 1.2 CAL PO LIQD
1000.0000 mL | ORAL | Status: DC
  Administered 2011-06-05 – 2011-06-08 (×3): 1000 mL
  Filled 2011-06-05: qty 1000

## 2011-06-05 NOTE — Progress Notes (Signed)
Patient's tube feeding stopped at 0250 after vomited 3 times in 15 minutes. Patient had been sleeping for several hours. Still with intermittent vomiting of mucus/gi contents throughout the night. Patient still with nausea complaints this am after feeding has been stopped for 4 hours. NO residual noted. Patient would like to try ensure this am. Gave strawberry ensure for patient to bolus in by gravity to PEG tube. [place] set up at bedside for use.

## 2011-06-05 NOTE — Progress Notes (Signed)
Patient ID: Kyle Hendrix, male   DOB: [birth year], 46 y.o.   MRN: [id number]  S: Around 3am nursing staff called a rapid response because the patient developed diplopia, dizziness, RUE and right facial weakness, and a frontal headache. This occurred after an episode of vomiting. Symptoms lasted for a total of 10 minutes before gradually subsiding. Pt denies history of any such symptoms.    O: VSS. Pt sitting up at edge of bed with eyes closed. PEARRLA, EOMI. Face symmetrical. Lungs CTA. Heart RRR, no murmurs. Grip slightly weaker in RUE. Slight pronator drift of RUE, and some mild ataxia of RUE (point to point). No significant findings on LE and gait not tested.  A/P: Pt with new onset of diplopia, RUE/facial weakness, dizziness, and headache, now resolved. Physical exam shows some slight non specific neurological findings as described above. Will obtain head CT to r/o CVA although clinically I doubt he has had anything significant.

## 2011-06-05 NOTE — Progress Notes (Signed)
Received call from patient's wife Efraim Kaufmann @ [phone]), she is very upset about husband's nausea and vomiting episodes; states that 'other physicians are not doing anything to help her husband'; assured her that hospitalist and other physicians involved were performing tests to assess for reasons why patient may be experiencing nausea and vomiting; spoke with Selena Batten, RN on inpatient about situation, and told her that Dr. Gaylyn Rong is not in office today and that on-call oncologist should be called if necessary; spoke to wife at length and she seemed to be more calmer at the end of conversation.

## 2011-06-05 NOTE — Progress Notes (Signed)
Patient awoke with nausea and vomiting around 0225. Sitting on side of bed vomiting mucus into trash can. Given ativan prn dose. Patient report a "severe" headache mid to left forehead and double vision. Keeping eyes closed. Complaining of numbness in right hand with noted weakness in grip. Tremors around mouth area. Speech clear. BP 127/81 pulse 112. Paged Rapid response RN to come assess. MD on floor seeing another patient made aware. She came to see patient. Symptoms lasted approximately 10 minutes with improvement noted in grips and decrease in headache pain score. MD ordered CT scan of head.   0330 patient had CT await results. Currently without headache nor numbness. Grips strong bilaterally.

## 2011-06-05 NOTE — Progress Notes (Signed)
[facility] PROGRESS NOTE Subjective Acute episode of neurologic symptoms last night noted. Urgent CT performed negative for any acute changes. Patient states feels much better this morning. He had bolus feeding about 2 hours ago and has not undergone any nausea and has tolerated well. Still has severe burning with swallowing. He finished his chemoradiation last week at  Objective: Vital signs in last 24 hours: Temp:  [98.3 F (36.8 C)-98.5 F (36.9 C)] 98.5 F (36.9 C) (05/31 0510) Pulse Rate:  [80-98] 98  (05/31 0510) Resp:  [16-18] 18  (05/31 0510) BP: (125-139)/(76-84) 125/80 mmHg (05/31 0510) SpO2:  [94 %-98 %] 98 % (05/31 0510) Last BM Date: 06/03/11  Intake/Output from previous day: 05/30 0701 - 05/31 0700 In: 2331.7 [P.O.:610; I.V.:1181.7] Out: 2235 [Urine:2235] Intake/Output this shift:    PE: Alert in no acute distress Abdomen-soft and nontender with good bowel sounds  Lab Results:  Basename 06/05/11 0350 06/03/11 0340 06/02/11 0915  WBC 2.4* 2.3* 1.7*  HGB 9.6* 10.0* 10.3*  HCT 28.0* 29.6* 29.8*  PLT 358 482* 491*   BMET  Basename 06/03/11 0340 06/02/11 0915  NA 143 137  K 3.5 4.1  CL 107 96  CO2 26 29  CREATININE 0.75 0.81   LFT  Basename 06/02/11 0915  PROT 7.6  AST 17  ALT 22  ALKPHOS 74  BILITOT 0.2*  BILIDIR --  IBILI --   PT/INR No results found for this basename: LABPROT:3,INR:3 in the last 72 hours PANCREAS  Basename 06/02/11 0915  LIPASE 26         Studies/Results: Ct Head Wo Contrast  06/05/2011  *RADIOLOGY REPORT*  Clinical Data: Sudden onset right-sided weakness, nausea and vomiting, frontal and left temporal headache, and right-sided weakness.  CT HEAD WITHOUT CONTRAST  Technique:  Contiguous axial images were obtained from the base of the skull through the vertex without contrast.  Comparison: 01/06/2011from [facility].  Findings: The ventricles and sulci are symmetrical without significant effacement,  displacement, or dilatation. No mass effect or midline shift. No abnormal extra-axial fluid collections. The grey-white matter junction is distinct. Basal cisterns are not effaced. No acute intracranial hemorrhage. No depressed skull fractures.  Opacification of some of the ethmoid air cells.  Air- fluid levels seen previously in the sphenoid sinus has resolved. Otherwise, no change.  IMPRESSION: No acute intracranial abnormalities demonstrated.  Original Report Authenticated By: Marlon Pel, M.D.    Medications: I have reviewed the patient's current medications.  Assessment/Plan: 1. Nausea and vomiting. In conjunction with the odynophagia my suspicion is that this is all a result of chemotherapy and radiation. In view of his neutropenia somewhat hesitant to do EGD. In any event he seems somewhat better today than yesterday. He is tolerating bolus feedings. We will continue that and add Carafate slurry and follow him clinically. I suspect she will continue to improve now that his chemotherapy and radiation this finished.   Brayli Klingbeil JR,Nashea Chumney L 06/05/2011, 8:58 AM

## 2011-06-05 NOTE — Progress Notes (Signed)
Nutrition Brief Note  - Noted that pt's TF turned off early this morning r/t vomiting 3 times in 15 minutes and pt still c/o nausea after TF stopped for 4 hours. Pt given Ensure this morning which pt bolused in PEG and then vomited up afterwards. Pt still c/o severe burning with swallowing. Carafate added. Met with pt this morning who desired changing back to continuous TF. Paged MD with this request, MD stated she will come see pt and write TF orders as appropriate.   Dietitian# [phone]

## 2011-06-05 NOTE — Progress Notes (Signed)
Subjective: Patient had episode of emesis last night during which he had violent retching. The patient states he's not had any vomiting since. He is requesting that his feedings be changed back to 24 hour feedings rather than the 12 hour feedings that he requested yesterday. He is on 3 anti-medic medications. He also feels that the secretions have been contributing to his nausea and has been using a [place]er suction catheter which she states has helped considerably. The patient inquired about a scopolamine patch however given the immediacy of the need for secretion control I have suggested atropine drops instead. The patient is agreeable to these.  Input of gastroneurology is noted.  Objective: Filed Vitals:   06/04/11 2215 06/05/11 0225 06/05/11 0510 06/05/11 1237  BP:  127/81 125/80 128/79  Pulse: 84 112 98 87  Temp:   98.5 F (36.9 C) 98.2 F (36.8 C)  TempSrc:   Oral Oral  Resp:   18 18  Height:      Weight:      SpO2:   98% 93%   Weight change:   Intake/Output Summary (Last 24 hours) at 06/05/11 1839 Last data filed at 06/05/11 1820  Gross per 24 hour  Intake   1906 ml  Output   1860 ml  Net     46 ml    General: Alert, awake, oriented x3, in no acute distress.  HEENT: [place]/AT [place]ER[place], EOMI Neck: Trachea midline,  no masses, no thyromegal,y no JVD, no carotid bruit OROPHARYNX:  Moist, No exudate/ erythema/lesions. Specifically no evidence of oral candidiasis and no posterior pharyngeal lesions or exudates noted.  Lungs: Clear to auscultation, no wheezing or rhonchi noted. No increased vocal fremitus resonant to percussion  Abdomen: Soft, nontender, nondistended, positive bowel sounds, no masses no hepatosplenomegaly noted..    Lab Results:  Basename 06/03/11 0340  NA 143  K 3.5  CL 107  CO2 26  GLUCOSE 107*  BUN 6  CREATININE 0.75  CALCIUM 8.9  MG --  PHOS --   No results found for this basename: AST:2,ALT:2,ALKPHOS:2,BILITOT:2,PROT:2,ALBUMIN:2 in the last 72  hours No results found for this basename: LIPASE:2,AMYLASE:2 in the last 72 hours  Basename 06/05/11 0350 06/03/11 0340  WBC 2.4* 2.3*  NEUTROABS 1.2* --  HGB 9.6* 10.0*  HCT 28.0* 29.6*  MCV 92.7 92.8  PLT 358 482*   No results found for this basename: CKTOTAL:3,CKMB:3,CKMBINDEX:3,TROPONINI:3 in the last 72 hours No components found with this basename: POCBNP:3 No results found for this basename: DDIMER:2 in the last 72 hours No results found for this basename: HGBA1C:2 in the last 72 hours No results found for this basename: CHOL:2,HDL:2,LDLCALC:2,TRIG:2,CHOLHDL:2,LDLDIRECT:2 in the last 72 hours No results found for this basename: TSH,T4TOTAL,FREET3,T3FREE,THYROIDAB in the last 72 hours No results found for this basename: VITAMINB12:2,FOLATE:2,FERRITIN:2,TIBC:2,IRON:2,RETICCTPCT:2 in the last 72 hours  Micro Results: Recent Results (from the past 240 hour(s))  RAPID STREP SCREEN     Status: Normal   Collection Time   06/05/11  6:10 AM      Component Value Range Status Comment   Streptococcus, Group A Screen (Direct) NEGATIVE  NEGATIVE  Final     Studies/Results: Dg Chest 2 View  06/02/2011  *RADIOLOGY REPORT*  Clinical Data: History of tonsillar carcinoma, some throat pain  CHEST - 2 VIEW  Comparison: Chest x-ray of 05/17/2011  Findings: The lungs are clear.  Mediastinal contours appear stable. The heart is within normal limits in size.  No bony abnormality is seen.  IMPRESSION: No active lung  disease.  Original Report Authenticated By: Juline Patch, M.D.   Dg Chest 2 View  05/17/2011  *RADIOLOGY REPORT*  Clinical Data: Fever.  Right tonsillar cancer.  Ongoing chemotherapy.  Asthma.  CHEST - 2 VIEW  Comparison: 04/23/2011  Findings: Linear subsegmental atelectasis noted in the left lower lobe.  Mildly low lung volumes are present.  Cardiac and mediastinal contours appear unremarkable.  No pleural effusion noted.  The lungs are otherwise clear.  IMPRESSION:  1.  Linear subsegmental  atelectasis in the left lower lobe. Otherwise, no significant abnormality identified.  Original Report Authenticated By: Dellia Cloud, M.D.   Ct Soft Tissue Neck Wo Contrast  05/18/2011  *RADIOLOGY REPORT*  Clinical Data: Sore throat, body aches, low grade fever.  History of parotid tumor with radiation treatments.  Contrast allergy.  CT NECK WITHOUT CONTRAST  Technique:  Multidetector CT imaging of the neck was performed without intravenous contrast.  Comparison: PET CT scan 02/25/2011  Findings: Enlarged lymph node behind the angle of the mandible on the right measures 1.7 x 1.7 cm.  There is central low attenuation suggesting necrosis.  This is smaller than on the previous study. There are diffusely scattered lymph nodes throughout the anterior and posterior cervical regions bilaterally which are not pathologically enlarged.  No significant tonsillar or mucosal swelling.  Salivary glands appear symmetrical.  Laryngeal structures appear symmetrical.  No new mass or lymphadenopathy is appreciated.  Degenerative changes in the cervical spine.  Reversal of the usual cervical lordosis, likely due to patient positioning. No prevertebral soft tissue swelling.  IMPRESSION: Mass in the right neck behind the angle of the mandible is decreasing in size.  No new mass, lymphadenopathy, or fluid collections identified.  Original Report Authenticated By: Marlon Pel, M.D.    Medications: I have reviewed the patient's current medications. Scheduled Meds:    . docusate sodium  100 mg Oral BID  . enoxaparin  40 mg Subcutaneous Q24H  . feeding supplement (OSMOLITE 1.2 CAL)  1,000 mL Per Tube Q24H  . pantoprazole sodium  40 mg Per Tube QHS  . prochlorperazine  25 mg Rectal Q8H  . sertraline  100 mg Per Tube QHS  . sucralfate  1 g Oral TID WC & HS  . DISCONTD: feeding supplement (OSMOLITE 1.2 CAL)  1,000 mL Per Tube Q24H  . DISCONTD: feeding supplement (OSMOLITE 1.2 CAL)  1,000 mL Per Tube Q24H    Continuous Infusions:    . sodium chloride 100 mL/hr at 06/05/11 1612  . feeding supplement (OSMOLITE 1.2 CAL) 1,000 mL (06/05/11 1610)   PRN Meds:.acetaminophen, acetaminophen, atropine, guaiFENesin, LORazepam, LORazepam, magic mouthwash, ondansetron (ZOFRAN) IV, oxyCODONE, promethazine Assessment/Plan: Patient Active Hospital Problem List: Nausea & vomiting (05/02/2011)   Assessment: Patient continues to have recurrent nausea and vomiting despite being off of radiation and chemotherapy. Patient has been seen by gastroenterology and input is appreciated. As requested by the patient today we will change his feedings next 24 hours which is opposite of what he requested yesterday that they be changed to nighttime infusion only.   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   Anemia of chronic disease (05/18/2011)   Assessment: Hemoglobin remains stable at 10.    Dehydration (06/02/2011)   Assessment: The patient presented in a state of dehydration secondary  to poor oral intake combined with emesis. Today he clinically appears well-hydrated and reports that he is having good urine output. Presently I will continue his IV fluids as his oral intake is still marginal.    Neutropenia (06/02/2011)   Assessment: The patient presented a neutropenic state however his white blood cell count is improving and today it is 2.3. His absolute neutrophil count is 1200   LOS: 3 days

## 2011-06-06 DIAGNOSIS — D709 Neutropenia, unspecified: Secondary | ICD-10-CM

## 2011-06-06 DIAGNOSIS — R112 Nausea with vomiting, unspecified: Secondary | ICD-10-CM

## 2011-06-06 DIAGNOSIS — C090 Malignant neoplasm of tonsillar fossa: Secondary | ICD-10-CM

## 2011-06-06 NOTE — Progress Notes (Signed)
Subjective: Patient states that he's been tolerating his feedings well. He feels that the decrease in secretions has had a significant effect on his ability to tolerate oral intake. He's been taking liquids without any difficulty. Patient asking about advancing to solid foods. Patient informed that he has had a regular diet ordered for the last 48 hours and only needs to do is to request his food from the dietary department.  Objective: Filed Vitals:   06/05/11 2248 06/06/11 0630 06/06/11 1408 06/06/11 1418  BP: 123/77 117/75 131/82   Pulse: 93 84 84   Temp: 98.7 F (37.1 C) 98.1 F (36.7 C) 97.9 F (36.6 C)   TempSrc: Oral Oral Oral   Resp: 18 16 14    Height:      Weight:    86.382 kg (190 lb 7 oz)  SpO2: 96% 100% 97%    Weight change:   Intake/Output Summary (Last 24 hours) at 06/06/11 1741 Last data filed at 06/06/11 1411  Gross per 24 hour  Intake   3499 ml  Output   1550 ml  Net   1949 ml    General: Alert, awake, oriented x3, in no acute distress.  HEENT: [place]/AT [place], EOMI Neck: Trachea midline,  no masses, no thyromegal,y no JVD, no carotid bruit OROPHARYNX:  Moist, No exudate/ erythema/lesions. Specifically no evidence of oral candidiasis and no posterior pharyngeal lesions or exudates noted.  Lungs: Clear to auscultation, no wheezing or rhonchi noted. No increased vocal fremitus resonant to percussion  Abdomen: Soft, nontender, nondistended, positive bowel sounds, no masses no hepatosplenomegaly noted..    Lab Results: No results found for this basename: NA:2,K:2,CL:2,CO2:2,GLUCOSE:2,BUN:2,CREATININE:2,CALCIUM:2,MG:2,PHOS:2 in the last 72 hours No results found for this basename: AST:2,ALT:2,ALKPHOS:2,BILITOT:2,PROT:2,ALBUMIN:2 in the last 72 hours No results found for this basename: LIPASE:2,AMYLASE:2 in the last 72 hours  Basename 06/05/11 0350  WBC 2.4*  NEUTROABS 1.2*  HGB 9.6*  HCT 28.0*  MCV 92.7  PLT 358   No results found for this basename:  CKTOTAL:3,CKMB:3,CKMBINDEX:3,TROPONINI:3 in the last 72 hours No components found with this basename: POCBNP:3 No results found for this basename: DDIMER:2 in the last 72 hours No results found for this basename: HGBA1C:2 in the last 72 hours No results found for this basename: CHOL:2,HDL:2,LDLCALC:2,TRIG:2,CHOLHDL:2,LDLDIRECT:2 in the last 72 hours No results found for this basename: TSH,T4TOTAL,FREET3,T3FREE,THYROIDAB in the last 72 hours No results found for this basename: VITAMINB12:2,FOLATE:2,FERRITIN:2,TIBC:2,IRON:2,RETICCTPCT:2 in the last 72 hours  Micro Results: Recent Results (from the past 240 hour(s))  RAPID STREP SCREEN     Status: Normal   Collection Time   06/05/11  6:10 AM      Component Value Range Status Comment   Streptococcus, Group A Screen (Direct) NEGATIVE  NEGATIVE  Final     Studies/Results: Dg Chest 2 View  06/02/2011  *RADIOLOGY REPORT*  Clinical Data: History of tonsillar carcinoma, some throat pain  CHEST - 2 VIEW  Comparison: Chest x-ray of 05/17/2011  Findings: The lungs are clear.  Mediastinal contours appear stable. The heart is within normal limits in size.  No bony abnormality is seen.  IMPRESSION: No active lung disease.  Original Report Authenticated By: Juline Patch, M.D.   Dg Chest 2 View  05/17/2011  *RADIOLOGY REPORT*  Clinical Data: Fever.  Right tonsillar cancer.  Ongoing chemotherapy.  Asthma.  CHEST - 2 VIEW  Comparison: 04/23/2011  Findings: Linear subsegmental atelectasis noted in the left lower lobe.  Mildly low lung volumes are present.  Cardiac and mediastinal contours appear unremarkable.  No pleural effusion noted.  The lungs are otherwise clear.  IMPRESSION:  1.  Linear subsegmental atelectasis in the left lower lobe. Otherwise, no significant abnormality identified.  Original Report Authenticated By: Dellia Cloud, M.D.   Ct Soft Tissue Neck Wo Contrast  05/18/2011  *RADIOLOGY REPORT*  Clinical Data: Sore throat, body aches, low  grade fever.  History of parotid tumor with radiation treatments.  Contrast allergy.  CT NECK WITHOUT CONTRAST  Technique:  Multidetector CT imaging of the neck was performed without intravenous contrast.  Comparison: PET CT scan 02/25/2011  Findings: Enlarged lymph node behind the angle of the mandible on the right measures 1.7 x 1.7 cm.  There is central low attenuation suggesting necrosis.  This is smaller than on the previous study. There are diffusely scattered lymph nodes throughout the anterior and posterior cervical regions bilaterally which are not pathologically enlarged.  No significant tonsillar or mucosal swelling.  Salivary glands appear symmetrical.  Laryngeal structures appear symmetrical.  No new mass or lymphadenopathy is appreciated.  Degenerative changes in the cervical spine.  Reversal of the usual cervical lordosis, likely due to patient positioning. No prevertebral soft tissue swelling.  IMPRESSION: Mass in the right neck behind the angle of the mandible is decreasing in size.  No new mass, lymphadenopathy, or fluid collections identified.  Original Report Authenticated By: Marlon Pel, M.D.    Medications: I have reviewed the patient's current medications. Scheduled Meds:    . docusate sodium  100 mg Oral BID  . enoxaparin  40 mg Subcutaneous Q24H  . pantoprazole sodium  40 mg Per Tube QHS  . prochlorperazine  25 mg Rectal Q8H  . sertraline  100 mg Per Tube QHS  . sucralfate  1 g Oral TID WC & HS   Continuous Infusions:    . sodium chloride 100 mL/hr at 06/06/11 0214  . feeding supplement (OSMOLITE 1.2 CAL) 1,000 mL (06/05/11 1610)   PRN Meds:.acetaminophen, acetaminophen, atropine, guaiFENesin, LORazepam, LORazepam, magic mouthwash, ondansetron (ZOFRAN) IV, oxyCODONE, promethazine Assessment/Plan: Patient Active Hospital Problem List: Nausea & vomiting (05/02/2011)   Assessment: Patient's nausea appears to have improved considerably with the control of  secretions. Patient to advance diet as tolerated and continue to feedings over 24 hours continuously.   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   Anemia of chronic disease (05/18/2011)   Assessment: Hemoglobin remains stable at 10.    Dehydration (06/02/2011)   Assessment: Resolved  Neutropenia (06/02/2011)   Assessment: The patient presented a neutropenic state however his white blood cell count is stable.  LOS: 4 days

## 2011-06-06 NOTE — Progress Notes (Signed)
Patient ID: Kyle Hendrix, male   DOB: 1965-11-11, 46 y.o.   MRN: [id number] [facility]astroenterology Progress Note  SEANMICHAEL SALMONS 46 y.o. 1965-09-15   Subjective: Feels a lot better today then yesterday with regards to his swallowing. Denies N/V this morning. Tolerating liquids without pain. Denies abdominal pain.    Objective: Vital signs: Filed Vitals:   06/06/11 0630  BP: 117/75  Pulse: 84  Temp: 98.1 F (36.7 C)  Resp: 16    Intake/Output last 24 hrs:  Intake/Output Summary (Last 24 hours) at 06/06/11 1210 Last data filed at 06/06/11 0902  Gross per 24 hour  Intake   3139 ml  Output   1800 ml  Net   1339 ml    Physical Exam: Gen: alert, no acute distress  Abd: soft, nontender, nondistended, +BS, PEG tube intact  Lab Results: No results found for this basename: NA:2,K:2,CL:2,CO2:2,GLUCOSE:2,BUN:2,CREATININE:2,CALCIUM:2,MG:2,PHOS:2 in the last 72 hours No results found for this basename: AST:2,ALT:2,ALKPHOS:2,BILITOT:2,PROT:2,ALBUMIN:2 in the last 72 hours  Basename 06/05/11 0350  WBC 2.4*  NEUTROABS 1.2*  HGB 9.6*  HCT 28.0*  MCV 92.7  PLT 358     Medications: I have reviewed the patient's current medications.  Assessment/Plan: Resolved N/V/odynophagia that was likely due to recent chemo/rads. Agree with advancing diet and continue supportive care. Continue Protonix and Carafate.   Ginny Loomer C. 06/06/2011, 12:10 PM

## 2011-06-06 NOTE — Progress Notes (Signed)
Pt vomited X1, yellow and green in color. Phenergan 25mg  Rectally given. Residual checked 30cc noted. TF remain at 70cc/hr. Pt did not have meal prior to vomiting. Pt resting in bed. Will continue to monitor. Dr.

## 2011-06-07 DIAGNOSIS — R112 Nausea with vomiting, unspecified: Secondary | ICD-10-CM

## 2011-06-07 DIAGNOSIS — D709 Neutropenia, unspecified: Secondary | ICD-10-CM

## 2011-06-07 DIAGNOSIS — C090 Malignant neoplasm of tonsillar fossa: Secondary | ICD-10-CM

## 2011-06-07 LAB — DIFFERENTIAL
Basophils Absolute: 0.1 10*3/uL (ref 0.0–0.1)
Basophils Relative: 2 % — ABNORMAL HIGH (ref 0–1)
Eosinophils Absolute: 0 10*3/uL (ref 0.0–0.7)
Lymphs Abs: 0.4 10*3/uL — ABNORMAL LOW (ref 0.7–4.0)
Monocytes Relative: 27 % — ABNORMAL HIGH (ref 3–12)
Neutro Abs: 1.6 10*3/uL — ABNORMAL LOW (ref 1.7–7.7)

## 2011-06-07 LAB — CBC
HCT: 30.9 % — ABNORMAL LOW (ref 39.0–52.0)
Hemoglobin: 10.6 g/dL — ABNORMAL LOW (ref 13.0–17.0)
MCHC: 34.3 g/dL (ref 30.0–36.0)
MCV: 90.9 fL (ref 78.0–100.0)
RDW: 16.9 % — ABNORMAL HIGH (ref 11.5–15.5)

## 2011-06-07 MED ORDER — SUCRALFATE 1 GM/10ML PO SUSP
1.0000 g | Freq: Once | ORAL | Status: AC
  Administered 2011-06-07: 1 g via ORAL
  Filled 2011-06-07: qty 10

## 2011-06-07 NOTE — Progress Notes (Signed)
Subjective: Patient states that he tolerated a small amount of breakfast this morning including aches, applesauce and use. Patient's mother at the bedside. Patient up walking and states that he feels better today. The patient states that he felt he had an amnestic period yesterday when he was unable to remember the presence of the physicians attending to him. Patient's mother endorses also she was therefore all physicians visits the patient was unable to remember. He is however fully awake and alert and oriented x3 and demonstrates good recent to remote recall.  The patient states that his nausea appears to have improved with the management of secretions. He is asking about possibly having a suction machine at home at the time of discharge.  Objective: Filed Vitals:   06/06/11 1408 06/06/11 1418 06/06/11 2129 06/07/11 0626  BP: 131/82  112/74 124/77  Pulse: 84  100 105  Temp: 97.9 F (36.6 C)  99 F (37.2 C) 98.7 F (37.1 C)  TempSrc: Oral  Oral Oral  Resp: 14  16 16   Height:      Weight:  86.382 kg (190 lb 7 oz)    SpO2: 97%  95% 96%   Weight change:   Intake/Output Summary (Last 24 hours) at 06/07/11 1438 Last data filed at 06/07/11 0806  Gross per 24 hour  Intake 4081.67 ml  Output   1300 ml  Net 2781.67 ml    General: Alert, awake, oriented x3, in no acute distress.  HEENT: [place]/AT PEERL, EOMI Neck: Trachea midline,  no masses, no thyromegal,y no JVD, no carotid bruit OROPHARYNX:  Moist, No exudate/ erythema/lesions. Specifically no evidence of oral candidiasis and no posterior pharyngeal lesions or exudates noted.  Lungs: Clear to auscultation, no wheezing or rhonchi noted. No increased vocal fremitus resonant to percussion  Abdomen: Soft, nontender, nondistended, positive bowel sounds, no masses no hepatosplenomegaly noted..  Extremities: No clubbing cyanosis or edema  Lab Results: No results found for this basename:  NA:2,K:2,CL:2,CO2:2,GLUCOSE:2,BUN:2,CREATININE:2,CALCIUM:2,MG:2,PHOS:2 in the last 72 hours No results found for this basename: AST:2,ALT:2,ALKPHOS:2,BILITOT:2,PROT:2,ALBUMIN:2 in the last 72 hours No results found for this basename: LIPASE:2,AMYLASE:2 in the last 72 hours  Basename 06/07/11 0355 06/05/11 0350  WBC 2.9* 2.4*  NEUTROABS 1.6* 1.2*  HGB 10.6* 9.6*  HCT 30.9* 28.0*  MCV 90.9 92.7  PLT 368 358   No results found for this basename: CKTOTAL:3,CKMB:3,CKMBINDEX:3,TROPONINI:3 in the last 72 hours No components found with this basename: POCBNP:3 No results found for this basename: DDIMER:2 in the last 72 hours No results found for this basename: HGBA1C:2 in the last 72 hours No results found for this basename: CHOL:2,HDL:2,LDLCALC:2,TRIG:2,CHOLHDL:2,LDLDIRECT:2 in the last 72 hours No results found for this basename: TSH,T4TOTAL,FREET3,T3FREE,THYROIDAB in the last 72 hours No results found for this basename: VITAMINB12:2,FOLATE:2,FERRITIN:2,TIBC:2,IRON:2,RETICCTPCT:2 in the last 72 hours  Micro Results: Recent Results (from the past 240 hour(s))  RAPID STREP SCREEN     Status: Normal   Collection Time   06/05/11  6:10 AM      Component Value Range Status Comment   Streptococcus, Group A Screen (Direct) NEGATIVE  NEGATIVE  Final     Studies/Results: Dg Chest 2 View  06/02/2011  *RADIOLOGY REPORT*  Clinical Data: History of tonsillar carcinoma, some throat pain  CHEST - 2 VIEW  Comparison: Chest x-ray of 05/17/2011  Findings: The lungs are clear.  Mediastinal contours appear stable. The heart is within normal limits in size.  No bony abnormality is seen.  IMPRESSION: No active lung disease.  Original Report Authenticated By:  Juline Patch, M.D.   Dg Chest 2 View  05/17/2011  *RADIOLOGY REPORT*  Clinical Data: Fever.  Right tonsillar cancer.  Ongoing chemotherapy.  Asthma.  CHEST - 2 VIEW  Comparison: 04/23/2011  Findings: Linear subsegmental atelectasis noted in the left lower  lobe.  Mildly low lung volumes are present.  Cardiac and mediastinal contours appear unremarkable.  No pleural effusion noted.  The lungs are otherwise clear.  IMPRESSION:  1.  Linear subsegmental atelectasis in the left lower lobe. Otherwise, no significant abnormality identified.  Original Report Authenticated By: Dellia Cloud, M.D.   Ct Soft Tissue Neck Wo Contrast  05/18/2011  *RADIOLOGY REPORT*  Clinical Data: Sore throat, body aches, low grade fever.  History of parotid tumor with radiation treatments.  Contrast allergy.  CT NECK WITHOUT CONTRAST  Technique:  Multidetector CT imaging of the neck was performed without intravenous contrast.  Comparison: PET CT scan 02/25/2011  Findings: Enlarged lymph node behind the angle of the mandible on the right measures 1.7 x 1.7 cm.  There is central low attenuation suggesting necrosis.  This is smaller than on the previous study. There are diffusely scattered lymph nodes throughout the anterior and posterior cervical regions bilaterally which are not pathologically enlarged.  No significant tonsillar or mucosal swelling.  Salivary glands appear symmetrical.  Laryngeal structures appear symmetrical.  No new mass or lymphadenopathy is appreciated.  Degenerative changes in the cervical spine.  Reversal of the usual cervical lordosis, likely due to patient positioning. No prevertebral soft tissue swelling.  IMPRESSION: Mass in the right neck behind the angle of the mandible is decreasing in size.  No new mass, lymphadenopathy, or fluid collections identified.  Original Report Authenticated By: Marlon Pel, M.D.    Medications: I have reviewed the patient's current medications. Scheduled Meds:    . docusate sodium  100 mg Oral BID  . enoxaparin  40 mg Subcutaneous Q24H  . pantoprazole sodium  40 mg Per Tube QHS  . prochlorperazine  25 mg Rectal Q8H  . sertraline  100 mg Per Tube QHS  . sucralfate  1 g Oral TID WC & HS  . sucralfate  1 g Oral  Once   Continuous Infusions:    . sodium chloride 100 mL/hr at 06/06/11 2156  . feeding supplement (OSMOLITE 1.2 CAL) 1,000 mL (06/05/11 1610)   PRN Meds:.acetaminophen, acetaminophen, atropine, guaiFENesin, LORazepam, LORazepam, magic mouthwash, ondansetron (ZOFRAN) IV, oxyCODONE, promethazine Assessment/Plan: Patient Active Hospital Problem List: Nausea & vomiting (05/02/2011)   Assessment: Patient's nausea appears to have improved considerably with the control of secretions. Patient to advance diet as tolerated and continue to feedings over 24 hours continuously.   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   Anemia of chronic disease (05/18/2011)   Assessment: Hemoglobin remains stable at 10.    Dehydration (06/02/2011)   Assessment: Resolved  Neutropenia (06/02/2011)   Assessment: The patient presented a neutropenic state however his white blood cell count is stable.  LOS: 5 days

## 2011-06-07 NOTE — Progress Notes (Signed)
Patient ID: Kyle Hendrix, male   DOB: 1965/04/05, 46 y.o.   MRN: [id number] [facility] Progress Note  KARMELO Hendrix 46 y.o. 06/22/1965   Subjective: Felt "strangled" in the back of his throat this morning. Reports that atropine drops that he was given made his throat hurt. Tolerated breakfast better than food yesterday which he vomited up and had nausea. Patient states he does not remember myself or Dr. Ashley Royalty seeing him yesterday and his mother states that he got confused last night. Reports his sleep patterns are off. Sitting up on side of the bed. Mother in room.  Objective: Vital signs: Filed Vitals:   06/07/11 0626  BP: 124/77  Pulse: 105  Temp: 98.7 F (37.1 C)  Resp: 16    Intake/Output last 24 hrs:  Intake/Output Summary (Last 24 hours) at 06/07/11 1205 Last data filed at 06/07/11 0806  Gross per 24 hour  Intake 4441.67 ml  Output   1525 ml  Net 2916.67 ml    Physical Exam: Gen: alert, no acute distress    Lab Results: No results found for this basename: NA:2,K:2,CL:2,CO2:2,GLUCOSE:2,BUN:2,CREATININE:2,CALCIUM:2,MG:2,PHOS:2 in the last 72 hours No results found for this basename: AST:2,ALT:2,ALKPHOS:2,BILITOT:2,PROT:2,ALBUMIN:2 in the last 72 hours  Basename 06/07/11 0355 06/05/11 0350  WBC 2.9* 2.4*  NEUTROABS 1.6* 1.2*  HGB 10.6* 9.6*  HCT 30.9* 28.0*  MCV 90.9 92.7  PLT 368 358     Medications: I have reviewed the patient's current medications.  Assessment/Plan: N/V/odynophagia that was likely due to recent chemo/rads for his head/neck cancer. Continue Protonix and Carafate. Continue supportive care. If strangled feeling recurs then may need viscous lidocaine. D/W Dr. Ashley Royalty. Will follow.    Laurie Lovejoy C. 06/07/2011, 12:05 PM

## 2011-06-07 NOTE — Progress Notes (Signed)
Nutrition Follow-up  Diet:  Reg- tolerated a small amount of breakfast this am (eggs, juice, applesauce). No appetite and has not ordered lunch.  TF:  Osmolite 1.2 at 70 ml per hour.  Tolerating well today.  Orders for night continuous feedings in the computer.  No vomiting today.  Last BM 5/31.  Discussed with pt (wife and mom present).  Continue TF, diet as tolerated.  Better to tolerate less than optimal than not at all.  Unit RD to follow/up  Derrell Lolling Anastasia Fiedler [phone]

## 2011-06-08 ENCOUNTER — Ambulatory Visit: Payer: Self-pay

## 2011-06-08 ENCOUNTER — Other Ambulatory Visit: Payer: Self-pay | Admitting: Oncology

## 2011-06-08 ENCOUNTER — Telehealth: Payer: Self-pay | Admitting: *Deleted

## 2011-06-08 DIAGNOSIS — C099 Malignant neoplasm of tonsil, unspecified: Secondary | ICD-10-CM

## 2011-06-08 DIAGNOSIS — D709 Neutropenia, unspecified: Secondary | ICD-10-CM

## 2011-06-08 DIAGNOSIS — R112 Nausea with vomiting, unspecified: Secondary | ICD-10-CM

## 2011-06-08 DIAGNOSIS — E46 Unspecified protein-calorie malnutrition: Secondary | ICD-10-CM

## 2011-06-08 DIAGNOSIS — K117 Disturbances of salivary secretion: Secondary | ICD-10-CM

## 2011-06-08 DIAGNOSIS — C090 Malignant neoplasm of tonsillar fossa: Secondary | ICD-10-CM

## 2011-06-08 MED ORDER — METOCLOPRAMIDE HCL 10 MG PO TABS: 5.0000 mg | ORAL_TABLET | Freq: Four times a day (QID) | ORAL | Status: DC | PRN

## 2011-06-08 NOTE — Progress Notes (Signed)
[facility] Progress Note  Subjective: Very somnolent from Phenergan, still complaining of nausea. No by mouth intake today. 8 some roast beef last night, seems to deny any odynophagia or dysphagia but does continue to complain of regurgitation or spitting or strangling on saliva.  Objective: Vital signs in last 24 hours: Temp:  [98.3 F (36.8 C)-98.5 F (36.9 C)] 98.5 F (36.9 C) (06/03 9604) Pulse Rate:  [92-103] 95  (06/03 0614) Resp:  [16] 16  (06/03 0614) BP: (114-120)/(78-81) 114/78 mmHg (06/03 0614) SpO2:  [96 %-97 %] 96 % (06/03 0614) Weight:  [86.229 kg (190 lb 1.6 oz)] 86.229 kg (190 lb 1.6 oz) (06/03 0614) Weight change: -0.153 kg (-5.4 oz)   PE: Detailed exam not done, he is intermittently sleeping lying on his right side.  Lab Results: No results found for this or any previous visit (from the past 24 hour(s)).  Studies/Results: No results found.    Assessment: Nausea vomiting after radiation and chemotherapy for head and neck cancer  Plan: Continue Carafate and Protonix. Will hold n.p.o. and reassess tomorrow. If he is no better in counts are acceptable consider EGD primarily rule out a fungal or viral esophagitis.    Genoa Freyre C 06/08/2011, 12:52 PM

## 2011-06-08 NOTE — Progress Notes (Signed)
Nutrition Brief Note  - Pt on continuous TF of Osmolite 1.2 with goal of 79ml/hr, however pt not able to advance past 88ml/hr over the weekend. RN reports pt had episode of emesis last night at 9:30pm and Zofran given at 9:45pm, then again this morning at 1:30am, and at 2am phenergan suppository given, then another episode of emesis at 8am and Zofran was given, and another emesis at 9:45am today and phenergan suppository given at 10am. RN turned off TF at 8am episode and notified MD. RN reports emesis is clear mucous and frothy, not vomiting TF. Other emesis episodes charted as appearing clear and full of mucous. RN reports pt with 20ml of TF residuals this morning. Noted GI is considering possible EGD to r/o fungal or viral esophagitis. Recommend MD consider trial of Reglan to see if this would help with nausea, however monitor for diarrhea. Last BM was yesterday per nursing. Will continue to monitor.  Dietitian# [phone]

## 2011-06-08 NOTE — Telephone Encounter (Signed)
Call from wife states pt still in hospital and no change in nausea/vomiting.  She voices frustration.  States being seen by GI but his WBC too low to do EGD.  She would appreciate input from Dr. Gaylyn Rong.   I informed her Dr. Gaylyn Rong plans to make rounds this evening after clinic hours and plans to see pt tonight.  She verbalized understanding.

## 2011-06-08 NOTE — Progress Notes (Signed)
Subjective: Patient had 2 good days in the last 48 hour however today he's had nausea and vomiting. He seen a Dr. Madilyn Fireman from gastroenterology who has held all his tube feedings at this time. Spoke at length with patient's mother who expresses that there is a lot of ongoing stress at home because of financial burden and the expectation that pt would be able to go back to work when he had completed treatment.  There is hesitation on the part of GI to proceed with endoscopy because of WBC count, however ANC > 1500.    Objective: Filed Vitals:   06/07/11 1442 06/07/11 2112 06/08/11 0614 06/08/11 1453  BP: 120/81 115/81 114/78 118/76  Pulse: 92 103 95 84  Temp: 98.3 F (36.8 C) 98.4 F (36.9 C) 98.5 F (36.9 C) 97.9 F (36.6 C)  TempSrc: Oral Oral Oral Oral  Resp: 16 16 16 14   Height:      Weight:   86.229 kg (190 lb 1.6 oz)   SpO2: 97% 97% 96% 95%   Weight change: -0.153 kg (-5.4 oz)  Intake/Output Summary (Last 24 hours) at 06/08/11 1720 Last data filed at 06/08/11 1542  Gross per 24 hour  Intake 2178.67 ml  Output   1375 ml  Net 803.67 ml    General: Alert, awake, oriented x3. Look like he's feeling poorly. HEENT: [place]/AT PEERL, EOMI Neck: Trachea midline,  no masses, no thyromegal,y no JVD, no carotid bruit OROPHARYNX:  Moist, No exudate/ erythema/lesions. Specifically no evidence of oral candidiasis and no posterior pharyngeal lesions or exudates noted.  Lungs: Clear to auscultation, no wheezing or rhonchi noted. No increased vocal fremitus resonant to percussion  Abdomen: Soft, nontender, nondistended, positive bowel sounds, no masses no hepatosplenomegaly noted..  Extremities: No clubbing cyanosis or edema  Lab Results: No results found for this basename: NA:2,K:2,CL:2,CO2:2,GLUCOSE:2,BUN:2,CREATININE:2,CALCIUM:2,MG:2,PHOS:2 in the last 72 hours No results found for this basename: AST:2,ALT:2,ALKPHOS:2,BILITOT:2,PROT:2,ALBUMIN:2 in the last 72 hours No results found for  this basename: LIPASE:2,AMYLASE:2 in the last 72 hours  Basename 06/07/11 0355  WBC 2.9*  NEUTROABS 1.6*  HGB 10.6*  HCT 30.9*  MCV 90.9  PLT 368   No results found for this basename: CKTOTAL:3,CKMB:3,CKMBINDEX:3,TROPONINI:3 in the last 72 hours No components found with this basename: POCBNP:3 No results found for this basename: DDIMER:2 in the last 72 hours No results found for this basename: HGBA1C:2 in the last 72 hours No results found for this basename: CHOL:2,HDL:2,LDLCALC:2,TRIG:2,CHOLHDL:2,LDLDIRECT:2 in the last 72 hours No results found for this basename: TSH,T4TOTAL,FREET3,T3FREE,THYROIDAB in the last 72 hours No results found for this basename: VITAMINB12:2,FOLATE:2,FERRITIN:2,TIBC:2,IRON:2,RETICCTPCT:2 in the last 72 hours  Micro Results: Recent Results (from the past 240 hour(s))  RAPID STREP SCREEN     Status: Normal   Collection Time   06/05/11  6:10 AM      Component Value Range Status Comment   Streptococcus, Group A Screen (Direct) NEGATIVE  NEGATIVE  Final     Studies/Results: Dg Chest 2 View  06/02/2011  *RADIOLOGY REPORT*  Clinical Data: History of tonsillar carcinoma, some throat pain  CHEST - 2 VIEW  Comparison: Chest x-ray of 05/17/2011  Findings: The lungs are clear.  Mediastinal contours appear stable. The heart is within normal limits in size.  No bony abnormality is seen.  IMPRESSION: No active lung disease.  Original Report Authenticated By: Juline Patch, M.D.   Dg Chest 2 View  05/17/2011  *RADIOLOGY REPORT*  Clinical Data: Fever.  Right tonsillar cancer.  Ongoing chemotherapy.  Asthma.  CHEST - 2 VIEW  Comparison: 04/23/2011  Findings: Linear subsegmental atelectasis noted in the left lower lobe.  Mildly low lung volumes are present.  Cardiac and mediastinal contours appear unremarkable.  No pleural effusion noted.  The lungs are otherwise clear.  IMPRESSION:  1.  Linear subsegmental atelectasis in the left lower lobe. Otherwise, no significant  abnormality identified.  Original Report Authenticated By: Dellia Cloud, M.D.   Ct Soft Tissue Neck Wo Contrast  05/18/2011  *RADIOLOGY REPORT*  Clinical Data: Sore throat, body aches, low grade fever.  History of parotid tumor with radiation treatments.  Contrast allergy.  CT NECK WITHOUT CONTRAST  Technique:  Multidetector CT imaging of the neck was performed without intravenous contrast.  Comparison: PET CT scan 02/25/2011  Findings: Enlarged lymph node behind the angle of the mandible on the right measures 1.7 x 1.7 cm.  There is central low attenuation suggesting necrosis.  This is smaller than on the previous study. There are diffusely scattered lymph nodes throughout the anterior and posterior cervical regions bilaterally which are not pathologically enlarged.  No significant tonsillar or mucosal swelling.  Salivary glands appear symmetrical.  Laryngeal structures appear symmetrical.  No new mass or lymphadenopathy is appreciated.  Degenerative changes in the cervical spine.  Reversal of the usual cervical lordosis, likely due to patient positioning. No prevertebral soft tissue swelling.  IMPRESSION: Mass in the right neck behind the angle of the mandible is decreasing in size.  No new mass, lymphadenopathy, or fluid collections identified.  Original Report Authenticated By: Marlon Pel, M.D.    Medications: I have reviewed the patient's current medications. Scheduled Meds:    . docusate sodium  100 mg Oral BID  . enoxaparin  40 mg Subcutaneous Q24H  . pantoprazole sodium  40 mg Per Tube QHS  . prochlorperazine  25 mg Rectal Q8H  . sertraline  100 mg Per Tube QHS  . sucralfate  1 g Oral TID WC & HS   Continuous Infusions:    . sodium chloride 100 mL/hr at 06/08/11 0158  . feeding supplement (OSMOLITE 1.2 CAL) 1,000 mL (06/08/11 0455)   PRN Meds:.acetaminophen, acetaminophen, atropine, guaiFENesin, LORazepam, LORazepam, magic mouthwash, ondansetron (ZOFRAN) IV, oxyCODONE,  promethazine Assessment/Plan: Patient Active Hospital Problem List: Nausea & vomiting (05/02/2011)   Assessment: Patient has recurrent nausea today. I discussed the idea of treating empirically, however pt was treated on 2 occasions and this did not appear to resolve the issue. My opinion is that an endoscopic evaluation is what is needed to best evaluate the nausea and vomiting.    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   Anemia of chronic disease (05/18/2011)   Assessment: Hemoglobin remains stable at 10.    Dehydration (06/02/2011)   Assessment: Resolved  Neutropenia (06/02/2011)   Assessment: The patient presented a neutropenic state however his white blood cell count is stable.  LOS: 6 days

## 2011-06-08 NOTE — Consult Note (Signed)
[facility] INPATIENT PROGRESS NOTE  Name: Kyle Hendrix      MRN: [id number]    Location: 1311/1311-01  Date: 06/08/2011 Time:7:34 PM   Subjective: Interval History:Kyle Hendrix reported that he had been feeling worse since I last saw him on Wednesday 06/03/2011.  He has had less thick oral phlegm and not much mucositis/dysphagia/odynophagia.  Thus, starting late last week, he started regular diet; had nausea/vomiting over the weekend.  Nausea/vomiting temporarily improved with IV antiemetics.  Yesterday, he had steak and again had nausea/vomiting yesterday.  Since he has been NPO today, his nausea/vomiting have improved.  He denied abdominal pain, change in bowel/bladder habit, fever, skin rash.  He had one episode of amnesia and focal motor weakness a few days ago which has completely resolved.    Objective: Vital signs in last 24 hours: Temp:  [97.9 F (36.6 C)-98.5 F (36.9 C)] 97.9 F (36.6 C) (06/03 1453) Pulse Rate:  [84-103] 84  (06/03 1453) Resp:  [14-16] 14  (06/03 1453) BP: (114-118)/(76-81) 118/76 mmHg (06/03 1453) SpO2:  [95 %-97 %] 95 % (06/03 1453) Weight:  [190 lb 1.6 oz (86.229 kg)] 190 lb 1.6 oz (86.229 kg) (06/03 4098)    Intake/Output from previous day: 06/02 0701 - 06/03 0700 In: 5060.7 [P.O.:600; I.V.:3050.7] Out: 2275 [Urine:2275]   ECOG 1  PHYSICAL EXAM:  Gen: Well-nourished, in no acute distress. Eyes: No scleral icterus or jaundice. ENT: There was no oropharyngeal lesions. Neck was supple without thyromegaly.  Skin in his neck has healed without any skin burn.  Lymphatics: Negative for cervical, supraclavicular, axillary, or inguinal adenopathy.  Respiratory: Lungs were clear bilaterally without wheezing or crackles. Cardiovascular: normal heart rate and rhythm; S1/S2; without murmur, rubs, or gallop. There was no pedal edema. GI: Abdomen was soft, flat, nontender, nondistended, without organomegaly.  PEG tube in place without pain,  purulent discharge, erythema.  Musculoskeletal exam: No spinal tenderness on palpation of vertebral spine. Skin exam was without ecchymosis, petechiae. Neuro exam was nonfocal. Patient was alert and oriented. Attention was good. Language was appropriate. Mood was slighly depressed.  Speech was not pressured. Thought content was not tangential.       Studies/Results: Results for orders placed during the hospital encounter of 06/02/11 (from the past 48 hour(s))  CBC     Status: Abnormal   Collection Time   06/07/11  3:55 AM      Component Value Range Comment   WBC 2.9 (*) 4.0 - 10.5 (K/uL)    RBC 3.40 (*) 4.22 - 5.81 (MIL/uL)    Hemoglobin 10.6 (*) 13.0 - 17.0 (g/dL)    HCT 11.9 (*) 14.7 - 52.0 (%)    MCV 90.9  78.0 - 100.0 (fL)    MCH 31.2  26.0 - 34.0 (pg)    MCHC 34.3  30.0 - 36.0 (g/dL)    RDW 82.9 (*) 56.2 - 15.5 (%)    Platelets 368  150 - 400 (K/uL)   DIFFERENTIAL     Status: Abnormal   Collection Time   06/07/11  3:55 AM      Component Value Range Comment   Neutrophils Relative 57  43 - 77 (%)    Lymphocytes Relative 14  12 - 46 (%)    Monocytes Relative 27 (*) 3 - 12 (%)    Eosinophils Relative 0  0 - 5 (%)    Basophils Relative 2 (*) 0 - 1 (%)    Neutro Abs 1.6 (*) 1.7 -  7.7 (K/uL)    Lymphs Abs 0.4 (*) 0.7 - 4.0 (K/uL)    Monocytes Absolute 0.8  0.1 - 1.0 (K/uL)    Eosinophils Absolute 0.0  0.0 - 0.7 (K/uL)    Basophils Absolute 0.1  0.0 - 0.1 (K/uL)    RBC Morphology POLYCHROMASIA PRESENT      No results found.   MEDICATIONS: reviewed.     Assessment/Plan:  1.  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  Last dose of chemo cisplatin on 04/28/2011.    2.  Intermittent nausea/vomiting:  - Differential:  Esophageal stricture from radiation treatment (normally, this does not come on until about 3 months out from finish of therapy) vs dysmotility.   There maybe a component of thick phlegm causing nausea/vomting but he claimed that he is  doing Robitussin to get the phlegm out.  I have no suspicion for chemo-induced nausea/vomiting since last chemo was on 04/28/11.  Clinically, there is no sign of obstruction.  - Work up:  I defer to GI to see which is the best study and when to rule out esophageal stricture - Therapy:  I added on Reglan to Compazine/Phenergan/Ativan/Zofran regimen in case there is a component of dysmotility.  This is only meant to be short term for a few weeks to decrease the risk of extrapyrimidal side effects.  I discussed this potential risk of Reglan.  Patient and his wife expressed informed understanding and wished to try Reglan for now.   3.  Xerostomia/thick phlegm: due to radiation. - I took the liberty as it's after hour instead of calling the primary team to discontinue Atropine drop.  He had some period of confusion last week and this does not help.  Atropine does decrease secretion but it will make his thick phlegm worse.  His thick phlegm is from xerostomia and only time will improve this.  In the meantime, to extract the thick phlegm, he needs to take Robitussin mouth rinse (not swallowed).  In the same line of reason, I recommend to avoid scopolamine patch.   4.  Moderate calorie/protine malnutrition:  As he has much less mucositis, and has been able to eat, I advised him to see if he can drink some of the Ensure instead of depending on 100% via PEG tube.  I advised him to only stick with bland/mechanical soft foods such as chicken soup and avoid heavy/greasy steak which he is not ready for.   5.  Amnesia; focal weakness:  Unclear etiology; CT head negative.  I cannot think of any reason why chemoradiation would cause these symptoms.    6.  Leukopenia/anemia:  Due to recent chemo.  These have slightly improved compared to prior.  No need for transfusion at this time.

## 2011-06-09 ENCOUNTER — Ambulatory Visit: Payer: BC Managed Care – PPO

## 2011-06-09 ENCOUNTER — Encounter (HOSPITAL_COMMUNITY): Payer: Self-pay

## 2011-06-09 ENCOUNTER — Encounter: Payer: Self-pay | Admitting: Oncology

## 2011-06-09 ENCOUNTER — Encounter (HOSPITAL_COMMUNITY)
Admission: EM | Disposition: A | Discharge status: Home / Self Care | Payer: Self-pay | Source: Home / Self Care | Attending: Internal Medicine

## 2011-06-09 DIAGNOSIS — C090 Malignant neoplasm of tonsillar fossa: Secondary | ICD-10-CM

## 2011-06-09 DIAGNOSIS — R112 Nausea with vomiting, unspecified: Secondary | ICD-10-CM

## 2011-06-09 DIAGNOSIS — D709 Neutropenia, unspecified: Secondary | ICD-10-CM

## 2011-06-09 HISTORY — PX: ESOPHAGOGASTRODUODENOSCOPY: SHX5428

## 2011-06-09 LAB — CBC
HCT: 31.4 % — ABNORMAL LOW (ref 39.0–52.0)
Hemoglobin: 10.6 g/dL — ABNORMAL LOW (ref 13.0–17.0)
MCHC: 33.8 g/dL (ref 30.0–36.0)
RBC: 3.41 MIL/uL — ABNORMAL LOW (ref 4.22–5.81)

## 2011-06-09 LAB — CREATININE, SERUM
GFR calc Af Amer: >90 mL/min (ref >90)
GFR calc non Af Amer: >90 mL/min (ref >90)

## 2011-06-09 LAB — DIFFERENTIAL
Basophils Relative: 0 % (ref 0–1)
Lymphs Abs: 0.4 10*3/uL — ABNORMAL LOW (ref 0.7–4.0)
Monocytes Absolute: 0.5 10*3/uL (ref 0.1–1.0)
Monocytes Relative: 17 % — ABNORMAL HIGH (ref 3–12)
Neutro Abs: 2 10*3/uL (ref 1.7–7.7)
Neutrophils Relative %: 68 % (ref 43–77)

## 2011-06-09 SURGERY — EGD (ESOPHAGOGASTRODUODENOSCOPY)
Anesthesia: Moderate Sedation

## 2011-06-09 MED ORDER — DIPHENHYDRAMINE HCL 50 MG/ML IJ SOLN
INTRAMUSCULAR | Status: AC
  Filled 2011-06-09: qty 1

## 2011-06-09 MED ORDER — FENTANYL NICU IV SYRINGE 50 MCG/ML
INJECTION | INTRAMUSCULAR | Status: DC | PRN
  Administered 2011-06-09 (×3): 25 ug via INTRAVENOUS

## 2011-06-09 MED ORDER — MIDAZOLAM HCL 10 MG/2ML IJ SOLN
INTRAMUSCULAR | Status: AC
  Filled 2011-06-09: qty 2

## 2011-06-09 MED ORDER — DIPHENHYDRAMINE HCL 50 MG/ML IJ SOLN
INTRAMUSCULAR | Status: DC | PRN
  Administered 2011-06-09: 25 mg via INTRAVENOUS

## 2011-06-09 MED ORDER — SODIUM CHLORIDE 0.9 % IV SOLN: Freq: Once | INTRAVENOUS | Status: DC

## 2011-06-09 MED ORDER — METOCLOPRAMIDE HCL 5 MG/5ML PO SOLN
5.0000 mg | Freq: Four times a day (QID) | ORAL | Status: DC | PRN
  Administered 2011-06-09 – 2011-06-11 (×6): 5 mg via ORAL
  Filled 2011-06-09 (×5): qty 5

## 2011-06-09 MED ORDER — FENTANYL CITRATE 0.05 MG/ML IJ SOLN
INTRAMUSCULAR | Status: AC
  Filled 2011-06-09: qty 2

## 2011-06-09 MED ORDER — ENSURE COMPLETE PO LIQD
237.0000 mL | Freq: Two times a day (BID) | ORAL | Status: DC
  Administered 2011-06-10 – 2011-06-11 (×2): 237 mL via ORAL

## 2011-06-09 MED ORDER — MIDAZOLAM HCL 10 MG/2ML IJ SOLN
INTRAMUSCULAR | Status: DC | PRN
  Administered 2011-06-09 (×3): 2 mg via INTRAVENOUS

## 2011-06-09 NOTE — Progress Notes (Signed)
[facility] Progress Note  Subjective: Still having significant nausea and anorexia with some vomiting I before today's endoscopic procedure  Objective: Vital signs in last 24 hours: Temp:  [97.9 F (36.6 C)-98.8 F (37.1 C)] 98.3 F (36.8 C) (06/04 0955) Pulse Rate:  [83-92] 83  (06/04 0955) Resp:  [14-24] 14  (06/04 1050) BP: (108-139)/(65-87) 108/65 mmHg (06/04 1050) SpO2:  [94 %-99 %] 99 % (06/04 1050) Weight:  [85.821 kg (189 lb 3.2 oz)] 85.821 kg (189 lb 3.2 oz) (06/04 0643) Weight change: -0.408 kg (-14.4 oz)   PE: Unchanged. He looks uncomfortable for nausea  Lab Results: Results for orders placed during the hospital encounter of 06/02/11 (from the past 24 hour(s))  CREATININE, SERUM     Status: Normal   Collection Time   06/09/11  3:38 AM      Component Value Range   Creatinine, Ser 0.68  0.50 - 1.35 (mg/dL)   GFR calc non Af Amer >90  >90 (mL/min)   GFR calc Af Amer >90  >90 (mL/min)  CBC     Status: Abnormal   Collection Time   06/09/11  3:38 AM      Component Value Range   WBC 2.9 (*) 4.0 - 10.5 (K/uL)   RBC 3.41 (*) 4.22 - 5.81 (MIL/uL)   Hemoglobin 10.6 (*) 13.0 - 17.0 (g/dL)   HCT 47.8 (*) 29.5 - 52.0 (%)   MCV 92.1  78.0 - 100.0 (fL)   MCH 31.1  26.0 - 34.0 (pg)   MCHC 33.8  30.0 - 36.0 (g/dL)   RDW 62.1 (*) 30.8 - 15.5 (%)   Platelets 291  150 - 400 (K/uL)  DIFFERENTIAL     Status: Abnormal   Collection Time   06/09/11  3:38 AM      Component Value Range   Neutrophils Relative 68  43 - 77 (%)   Neutro Abs 2.0  1.7 - 7.7 (K/uL)   Lymphocytes Relative 14  12 - 46 (%)   Lymphs Abs 0.4 (*) 0.7 - 4.0 (K/uL)   Monocytes Relative 17 (*) 3 - 12 (%)   Monocytes Absolute 0.5  0.1 - 1.0 (K/uL)   Eosinophils Relative 0  0 - 5 (%)   Eosinophils Absolute 0.0  0.0 - 0.7 (K/uL)   Basophils Relative 0  0 - 1 (%)   Basophils Absolute 0.0  0.0 - 0.1 (K/uL)    Studies/Results: EGD: Minimal proximal esophagitis with no specific appearance, no stricture  ulceration or suggested infectious etiology. Mid and distal esophagus and GE junction absolutely normal   Assessment: Continued nausea vomiting, difficulty with secretions, handling of phlegm et Karie Soda. Unclear how much of relatively mild proximal esophagitis is related to symptomatology at this point.  Plan: Continue proton pump inhibitor, Carafate elixir as needed and when necessary anti-emetics as needed. I agree with use of Reglan. ENT evaluation as scheduled or sooner if needed. We'll continue to follow with you.    Lucylle Foulkes C 06/09/2011, 10:50 AM

## 2011-06-09 NOTE — Op Note (Signed)
[facility] [address] [place], [place]  [address]  ENDOSCOPY PROCEDURE REPORT  PATIENT:  Kyle Hendrix, Kyle Hendrix  MR#:  [id number] BIRTHDATE:  04/21/1965, 46 yrs. old  GENDER:  male  ENDOSCOPIST:  Dorena Cookey Referred by:  PROCEDURE DATE:  06/09/2011 PROCEDURE: ASA CLASS: INDICATIONS:  MEDICATIONS: Fentanyl, 75 mcg, Versed 6 mg, Benadryl 25 mg. TOPICAL ANESTHETIC:  DESCRIPTION OF PROCEDURE:   After the risks benefits and alternatives of the procedure were thoroughly explained, informed consent was obtained.  The Pentax Gastroscope [id number] endoscope was introduced through the mouth and advanced to the , without limitations.  The instrument was slowly withdrawn as the mucosa was fully examined. <<PROCEDUREIMAGES>>  FINDINGS: Mild friability and erosive esophagitis limited to the proximal esophagus from approximately 20-25 cm from the anus. No discrete ulcers, or typical viral or fungal esophagitis suggested. The mid and distal esophagus was completely normal all the way to the gastroesophageal junction. Stomach was unremarkable. PEG tube bumper was in appropriate position and appeared to be unclogged. Duodenum showed minimal bulbar duodenitis otherwise normal. No biopsies taken.  COMPLICATIONS:  None  ENDOSCOPIC IMPRESSION: Mild limited proximal esophagitis, possibly from previous irradiation, possibly even trauma from recent retching. Nonspecific in appearance with no suggestion of stricture, no ulcer or no typical appearance for infectious etiologies.  RECOMMENDATIONS: Continue proton pump inhibitor, Carafate elixir temporarily, and anti-emetics as needed.  REPEAT EXAM:  ______________________________ Dorena Cookey  CC:  n. eSIGNED:   Dorena Cookey at 06/09/2011 10:48 AM  Demetra Shiner, [phone]

## 2011-06-09 NOTE — Progress Notes (Signed)
Subjective: Patient had EGD today which showed mild erosive esophagitis at the proximal portion of the esophagus. It is noteworthy that the patient has had been markedly diminished the patient in the past 24 hours since his tube feedings have been discontinued. I discussed this with the patient we both wonder whether or not the patient may have some form of an allergy with activating his immune system causing increased secretions. The atropine dose was discontinued last night by Dr. Gaylyn Rong, however the patient's is depressed in atropine drops to be restarted. I had a long discussion with him and he is willing to try taking the Reglan and using the Robitussin wash. He will have further discussion with Dr., about 6 his secretions and need to restart atropine drops.  The patient's greatest complaint is frustration in his current condition and a desire to get back to a normally functioning. He requests that before changing to make his therapy he presented briefly discussed with him and not just with his wife or his mother.   Objective: Filed Vitals:   06/09/11 1100 06/09/11 1110 06/09/11 1116 06/09/11 1419  BP: 107/70 116/54 116/54 132/87  Pulse:    86  Temp:    97.7 F (36.5 C)  TempSrc:    Oral  Resp: 14 15 14 18   Height:      Weight:      SpO2: 99% 96% 95% 97%   Weight change: -0.408 kg (-14.4 oz)  Intake/Output Summary (Last 24 hours) at 06/09/11 1732 Last data filed at 06/09/11 1400  Gross per 24 hour  Intake    889 ml  Output    875 ml  Net     14 ml    General: Alert, awake, oriented x3. Look like he's feeling poorly. HEENT: [place]/AT PEERL, EOMI Neck: Trachea midline,  no masses, no thyromegal,y no JVD, no carotid bruit OROPHARYNX:  Moist, No exudate/ erythema/lesions. Specifically no evidence of oral candidiasis and no posterior pharyngeal lesions or exudates noted.  Lungs: Clear to auscultation, no wheezing or rhonchi noted. No increased vocal fremitus resonant to percussion  Abdomen:  Soft, nontender, nondistended, positive bowel sounds, no masses no hepatosplenomegaly noted..  Extremities: No clubbing cyanosis or edema  Lab Results:  Basename 06/09/11 0338  NA --  K --  CL --  CO2 --  GLUCOSE --  BUN --  CREATININE 0.68  CALCIUM --  MG --  PHOS --   No results found for this basename: AST:2,ALT:2,ALKPHOS:2,BILITOT:2,PROT:2,ALBUMIN:2 in the last 72 hours No results found for this basename: LIPASE:2,AMYLASE:2 in the last 72 hours  Basename 06/09/11 0338 06/07/11 0355  WBC 2.9* 2.9*  NEUTROABS 2.0 1.6*  HGB 10.6* 10.6*  HCT 31.4* 30.9*  MCV 92.1 90.9  PLT 291 368   No results found for this basename: CKTOTAL:3,CKMB:3,CKMBINDEX:3,TROPONINI:3 in the last 72 hours No components found with this basename: POCBNP:3 No results found for this basename: DDIMER:2 in the last 72 hours No results found for this basename: HGBA1C:2 in the last 72 hours No results found for this basename: CHOL:2,HDL:2,LDLCALC:2,TRIG:2,CHOLHDL:2,LDLDIRECT:2 in the last 72 hours No results found for this basename: TSH,T4TOTAL,FREET3,T3FREE,THYROIDAB in the last 72 hours No results found for this basename: VITAMINB12:2,FOLATE:2,FERRITIN:2,TIBC:2,IRON:2,RETICCTPCT:2 in the last 72 hours  Micro Results: Recent Results (from the past 240 hour(s))  RAPID STREP SCREEN     Status: Normal   Collection Time   06/05/11  6:10 AM      Component Value Range Status Comment   Streptococcus, Group A Screen (Direct)  NEGATIVE  NEGATIVE  Final     Studies/Results: Dg Chest 2 View  06/02/2011  *RADIOLOGY REPORT*  Clinical Data: History of tonsillar carcinoma, some throat pain  CHEST - 2 VIEW  Comparison: Chest x-ray of 05/17/2011  Findings: The lungs are clear.  Mediastinal contours appear stable. The heart is within normal limits in size.  No bony abnormality is seen.  IMPRESSION: No active lung disease.  Original Report Authenticated By: Juline Patch, M.D.   Dg Chest 2 View  05/17/2011  *RADIOLOGY  REPORT*  Clinical Data: Fever.  Right tonsillar cancer.  Ongoing chemotherapy.  Asthma.  CHEST - 2 VIEW  Comparison: 04/23/2011  Findings: Linear subsegmental atelectasis noted in the left lower lobe.  Mildly low lung volumes are present.  Cardiac and mediastinal contours appear unremarkable.  No pleural effusion noted.  The lungs are otherwise clear.  IMPRESSION:  1.  Linear subsegmental atelectasis in the left lower lobe. Otherwise, no significant abnormality identified.  Original Report Authenticated By: Dellia Cloud, M.D.   Ct Soft Tissue Neck Wo Contrast  05/18/2011  *RADIOLOGY REPORT*  Clinical Data: Sore throat, body aches, low grade fever.  History of parotid tumor with radiation treatments.  Contrast allergy.  CT NECK WITHOUT CONTRAST  Technique:  Multidetector CT imaging of the neck was performed without intravenous contrast.  Comparison: PET CT scan 02/25/2011  Findings: Enlarged lymph node behind the angle of the mandible on the right measures 1.7 x 1.7 cm.  There is central low attenuation suggesting necrosis.  This is smaller than on the previous study. There are diffusely scattered lymph nodes throughout the anterior and posterior cervical regions bilaterally which are not pathologically enlarged.  No significant tonsillar or mucosal swelling.  Salivary glands appear symmetrical.  Laryngeal structures appear symmetrical.  No new mass or lymphadenopathy is appreciated.  Degenerative changes in the cervical spine.  Reversal of the usual cervical lordosis, likely due to patient positioning. No prevertebral soft tissue swelling.  IMPRESSION: Mass in the right neck behind the angle of the mandible is decreasing in size.  No new mass, lymphadenopathy, or fluid collections identified.  Original Report Authenticated By: Marlon Pel, M.D.    Medications: I have reviewed the patient's current medications. Scheduled Meds:    . docusate sodium  100 mg Oral BID  . enoxaparin  40 mg  Subcutaneous Q24H  . feeding supplement  237 mL Oral BID BM  . pantoprazole sodium  40 mg Per Tube QHS  . sertraline  100 mg Per Tube QHS  . sucralfate  1 g Oral TID WC & HS  . DISCONTD: sodium chloride   Intravenous Once   Continuous Infusions:    . sodium chloride 100 mL/hr at 06/09/11 0822  . feeding supplement (OSMOLITE 1.2 CAL) 1,000 mL (06/08/11 0455)   PRN Meds:.acetaminophen, acetaminophen, guaiFENesin, LORazepam, LORazepam, magic mouthwash, metoCLOPramide, ondansetron (ZOFRAN) IV, oxyCODONE, promethazine, DISCONTD: atropine, DISCONTD: diphenhydrAMINE, DISCONTD: fentaNYL, DISCONTD: metoCLOPramide, DISCONTD: midazolam Assessment/Plan: Patient Active Hospital Problem List: Nausea & vomiting (05/02/2011)   Assessment: Patient has recurrent nausea today. I would recommend discontinuing tube feedings in place him on a clear liquid diet and advance as tolerating.   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   Anemia of chronic disease (05/18/2011)   Assessment: Hemoglobin remains stable at  10.    Dehydration (06/02/2011)   Assessment: Resolved  Neutropenia (06/02/2011)   Assessment: The patient presented a neutropenic state however his white blood cell count is stable.  LOS: 7 days

## 2011-06-09 NOTE — Progress Notes (Signed)
Nutrition Brief Note  - Pt had EGD today which showed mild erosive esophagitis. Provided education on nutrition therapy for esophagitis including recommended foods and foods to avoid - provided handout of this information.  - Pt still c/o nausea, but states he thinks Reglan is helping.  - Pt reports 1 episode of mucous emesis after EGD. - Mother at bedside who reports that she thinks pt's biggest problem is the mucous production.  - Pt c/o severe sore throat r/t EGD but has not been using chloroseptic spray. Pt reports improvement in symptoms using Carafate.  - Pt interested in trying to eat - assisted pt in ordering full liquid dinner. Pt wanting Ensure - will order.  - No TF orders currently. Discussed with RN who stated MD will defer to GI for resuming TF.   Will continue to monitor.   Dietitian# [phone]

## 2011-06-10 ENCOUNTER — Encounter (HOSPITAL_COMMUNITY): Payer: Self-pay | Admitting: Gastroenterology

## 2011-06-10 DIAGNOSIS — E860 Dehydration: Secondary | ICD-10-CM

## 2011-06-10 DIAGNOSIS — R413 Other amnesia: Secondary | ICD-10-CM | POA: Diagnosis present

## 2011-06-10 DIAGNOSIS — E440 Moderate protein-calorie malnutrition: Secondary | ICD-10-CM | POA: Diagnosis present

## 2011-06-10 DIAGNOSIS — C090 Malignant neoplasm of tonsillar fossa: Secondary | ICD-10-CM

## 2011-06-10 DIAGNOSIS — R531 Weakness: Secondary | ICD-10-CM | POA: Diagnosis present

## 2011-06-10 DIAGNOSIS — D709 Neutropenia, unspecified: Secondary | ICD-10-CM

## 2011-06-10 DIAGNOSIS — K2210 Ulcer of esophagus without bleeding: Secondary | ICD-10-CM | POA: Diagnosis present

## 2011-06-10 DIAGNOSIS — R112 Nausea with vomiting, unspecified: Secondary | ICD-10-CM

## 2011-06-10 MED ORDER — ATROPINE SULFATE 1 % OP SOLN
1.0000 [drp] | Freq: Every evening | OPHTHALMIC | Status: DC | PRN
  Administered 2011-06-10: 1 [drp] via OPHTHALMIC
  Filled 2011-06-10: qty 2

## 2011-06-10 MED ORDER — SCOPOLAMINE 1 MG/3DAYS TD PT72
1.0000 | MEDICATED_PATCH | TRANSDERMAL | Status: DC
  Filled 2011-06-10: qty 1

## 2011-06-10 NOTE — Progress Notes (Signed)
PROGRESS NOTE  Kyle Hendrix ZOX:[id number] DOB: [birth year] DOA: 06/02/2011 PCP: Pamelia Hoit, MD, MD  Brief narrative: Kyle Hendrix is a 46 year old man with a past medical history of tonsillar squamous cell carcinoma status post chemotherapy and radiation therapy who presented to the hospital with nausea, vomiting, and generalized weakness on 06/02/2011. He underwent upper endoscopy on 06/09/2011 for persistent symptoms which showed erosive esophagitis.  Assessment/Plan: Principal Problem:  *Nausea & vomiting with xerostomia  Esophageal stricture ruled out with EGD.  Not felt to be chemotherapy induced since his last chemotherapy was 04/28/2011.  Reglan started to address any component of dysmotility.  Continue Robitussin for mucolysis.  Dr. Gaylyn Rong recommends avoiding scopolamine and atropine for now. Active Problems:  Right tonsillar squamous cell carcinoma  Seen by Dr. Gaylyn Rong on 06/08/2011. Completed radiation/chemotherapy with last dose of chemotherapy on 04/28/2011.  Anemia of chronic disease  Likely related to anemia of chronic disease in the setting of recent chemotherapy.  Dehydration  Resolved with IV fluids.  Leukopenia  Likely related to prior chemotherapy. White blood cell count stable.  Erosive esophagitis / moderate protein calorie malnutrition  Seen and evaluated by GI who performed an EGD on 06/09/11 which showed erosive esophagitis.  Continue Carafate, PPI.  The patient has a PEG tube and can receive supplementation therapy for this if needed.  Ensure supplements ordered by dietitian. `Amnesia/ Focal Weakness  Unclear etiology. Patient had a CT scan of his head which was negative. No cause identified by Dr. Gaylyn Rong.  Code Status: Full Family Communication: None at bedside Disposition Plan: Home when stable.  Medical Consultants:  Dr. Dorena Cookey, Gastroenterology  Dr. Jethro Bolus, Oncology  Other consultants:  Dietician  Antibiotics:  None   Subjective  Mr.  Kyle Hendrix is a bit anxious about discharging home. He is concerned that his nausea and vomiting are still going to be problematic and is worried that he will not be able to manage them as effectively at home as here. His bowels are moving normally. He continues to have complaints of excessive secretions.   Objective    Interim History: Stable overnight, status post EGD on 06/09/2011.   Objective: Filed Vitals:   06/09/11 1419 06/09/11 2110 06/09/11 2330 06/10/11 0549  BP: 132/87 97/60  99/61  Pulse: 86 78 82 91  Temp: 97.7 F (36.5 C) 98 F (36.7 C)  98.3 F (36.8 C)  TempSrc: Oral Oral  Oral  Resp: 18 16  15   Height:      Weight:    85.911 kg (189 lb 6.4 oz)  SpO2: 97% 97%  96%    Intake/Output Summary (Last 24 hours) at 06/10/11 0855 Last data filed at 06/10/11 0650  Gross per 24 hour  Intake 3408.69 ml  Output   1976 ml  Net 1432.69 ml    Exam: Gen:  NAD Cardiovascular:  RRR, No M/R/G Respiratory: Lungs CTAB Gastrointestinal: Abdomen soft, NT/ND with normal active bowel sounds. Extremities: No C/E/C  Data Reviewed: Basic Metabolic Panel:  Lab 06/09/11 8119  NA --  K --  CL --  CO2 --  GLUCOSE --  BUN --  CREATININE 0.68  CALCIUM --  MG --  PHOS --   GFR Estimated Creatinine Clearance: 125.7 ml/min (by C-G formula based on Cr of 0.68).  CBC:  Lab 06/09/11 0338 06/07/11 0355 06/05/11 0350  WBC 2.9* 2.9* 2.4*  NEUTROABS 2.0 1.6* 1.2*  HGB 10.6* 10.6* 9.6*  HCT 31.4* 30.9* 28.0*  MCV 92.1 90.9 92.7  PLT  291 368 358   Microbiology Recent Results (from the past 240 hour(s))  RAPID STREP SCREEN     Status: Normal   Collection Time   06/05/11  6:10 AM      Component Value Range Status Comment   Streptococcus, Group A Screen (Direct) NEGATIVE  NEGATIVE  Final     Procedures and Diagnostic Studies:  Dg Chest 2 View 06/02/2011 IMPRESSION: No active lung disease.  Original Report Authenticated By: Juline Patch, M.D.     Ct Head Wo Contrast  06/05/2011 IMPRESSION: No acute intracranial abnormalities demonstrated.  Original Report Authenticated By: Marlon Pel, M.D.   Scheduled Meds:    . docusate sodium  100 mg Oral BID  . enoxaparin  40 mg Subcutaneous Q24H  . feeding supplement  237 mL Oral BID BM  . pantoprazole sodium  40 mg Per Tube QHS  . scopolamine  1 patch Transdermal Q72H  . sertraline  100 mg Per Tube QHS  . sucralfate  1 g Oral TID WC & HS  . DISCONTD: sodium chloride   Intravenous Once   Continuous Infusions:    . sodium chloride 1,000 mL (06/10/11 0317)  . feeding supplement (OSMOLITE 1.2 CAL) Stopped (06/09/11 1900)      LOS: 8 days   Hillery Aldo, MD Pager [phone]  06/10/2011, 8:55 AM

## 2011-06-10 NOTE — Plan of Care (Signed)
Problem: Phase III Progression Outcomes Goal: Activity at appropriate level-compared to baseline (UP IN CHAIR FOR HEMODIALYSIS)  Outcome: Completed/Met Date Met:  06/10/11 Patient up ambulating in hallway x 3 this shift

## 2011-06-10 NOTE — Progress Notes (Signed)
CARE MANAGEMENT NOTE 06/10/2011  Patient:  FLAVIO, LINDROTH   Account Number:  [id number]  Date Initiated:  06/10/2011  Documentation initiated by:  Pate Aylward  Subjective/Objective Assessment:   46 yo male admitted 05/31/11 with dehydration and abdominal pain     Action/Plan:   D/C when medically stable   Anticipated DC Date:  06/13/2011   Anticipated DC Plan:  HOME W HOME HEALTH SERVICES      DC Planning Services  CM consult      [facility]  DURABLE MEDICAL EQUIPMENT   Choice offered to / List presented to:  C-1 Patient   DME arranged  SUCTION      DME agency  [facility]        Status of service:  Completed, signed off  Discharge Disposition:  HOME W HOME HEALTH SERVICES  Per UR Regulation:  Reviewed for med. necessity/level of care/duration of stay  If discussed at Long Length of Stay Meetings, dates discussed:   06/10/2011    Comments:  06/10/11, Kathi Der RNC-MNN, BSN, [phone], CM received referral.  CM met with pt.  Lucretia at [facility] contacted with DME order and confirmation received.  Pt's wife will be able to assist at home as needed.  Possible discharge on 06/11/11.

## 2011-06-11 DIAGNOSIS — C090 Malignant neoplasm of tonsillar fossa: Secondary | ICD-10-CM

## 2011-06-11 DIAGNOSIS — E860 Dehydration: Secondary | ICD-10-CM

## 2011-06-11 DIAGNOSIS — R112 Nausea with vomiting, unspecified: Secondary | ICD-10-CM

## 2011-06-11 DIAGNOSIS — D709 Neutropenia, unspecified: Secondary | ICD-10-CM

## 2011-06-11 MED ORDER — ONDANSETRON 8 MG PO TBDP: 8.0000 mg | ORAL_TABLET | Freq: Three times a day (TID) | ORAL | Status: DC | PRN

## 2011-06-11 MED ORDER — METOCLOPRAMIDE HCL 5 MG/5ML PO SOLN: 5.0000 mg | Freq: Four times a day (QID) | ORAL | Status: DC | PRN

## 2011-06-11 MED ORDER — PROMETHAZINE HCL 25 MG RE SUPP: 25.0000 mg | Freq: Two times a day (BID) | RECTAL | Status: DC | PRN

## 2011-06-11 MED ORDER — ATROPINE SULFATE 1 % OP SOLN: 1.0000 [drp] | Freq: Every evening | OPHTHALMIC | Status: DC | PRN

## 2011-06-11 NOTE — Discharge Summary (Signed)
Physician Discharge Summary  Patient ID: Kyle Hendrix MRN: [id number] DOB/AGE: May 06, 1965 46 y.o.  Admit date: 06/02/2011 Discharge date: 06/11/2011  Primary Care Physician:  Kyle Hoit, MD, MD ENT: Dr. Flo Hendrix   Discharge Diagnoses:   .Nausea & vomiting with xerostomia .Dehydration .Leukopenia .Right tonsillar squamous cell carcinoma .Anemia of chronic disease .Erosive esophagitis .Moderate protein-calorie malnutrition .Amnesia .Progressive focal motor weakness  Discharge Medications:  Medication List  As of 06/11/2011  1:47 PM   TAKE these medications         atropine 1 % ophthalmic solution   Place 1 drop into the left eye at bedtime as needed (Excessive secretions).      Diphenhyd-Hydrocort-Nystatin Susp   Use as directed 15 mLs in the mouth or throat 4 (four) times daily as needed (Swish, gargle and spit as needed for mouth sores).      guaiFENesin 100 MG/5ML Soln   Commonly known as: ROBITUSSIN   Take 10 mLs by mouth every 3 (three) hours as needed.      LORazepam 0.5 MG tablet   Commonly known as: ATIVAN   Take 1 tablet (0.5 mg total) by mouth every 6 (six) hours as needed (Nausea or vomiting).      metoCLOPramide 5 MG/5ML solution   Commonly known as: REGLAN   Take 5 mLs (5 mg total) by mouth every 6 (six) hours as needed.      omeprazole 20 MG capsule   Commonly known as: PRILOSEC   Take 1 capsule (20 mg total) by mouth daily.      ondansetron 8 MG disintegrating tablet   Commonly known as: ZOFRAN-ODT   Take 1 tablet (8 mg total) by mouth every 8 (eight) hours as needed. For nausea      oxyCODONE 20 MG/ML concentrated solution   Commonly known as: ROXICODONE INTENSOL   Take 10 mg by mouth every 4 (four) hours as needed. Pain      prochlorperazine 10 MG tablet   Commonly known as: COMPAZINE   Take 10 mg by mouth every 6 (six) hours as needed.      promethazine 25 MG suppository   Commonly known as: PHENERGAN   Place 1 suppository (25  mg total) rectally 2 (two) times daily as needed for nausea.      sertraline 100 MG tablet   Commonly known as: ZOLOFT   Take 100 mg by mouth daily.             Disposition and Follow-up: The patient is being discharged home. He is instructed to followup with Dr. Lazarus Hendrix next week. Can followup with his primary care physician as needed.   Medical Consultants:  Dr. Dorena Hendrix, Gastroenterology  Dr. Jethro Hendrix, Oncology Other consultants:  Dietician  Procedures and Diagnostic Studies:  Dg Chest 2 View 06/02/2011 IMPRESSION: No active lung disease. Original Report Authenticated By: Kyle Hendrix, M.D.  Ct Head Wo Contrast 06/05/2011 IMPRESSION: No acute intracranial abnormalities demonstrated. Original Report Authenticated By: Kyle Hendrix, M.D.    Discharge Laboratory Values: Basic Metabolic Panel:  Lab 06/09/11 0865  NA --  K --  CL --  CO2 --  GLUCOSE --  BUN --  CREATININE 0.68  CALCIUM --  MG --  PHOS --   GFR Estimated Creatinine Clearance: 125.8 ml/min (by C-G formula based on Cr of 0.68).  CBC:  Lab 06/09/11 0338 06/07/11 0355 06/05/11 0350  WBC 2.9* 2.9* 2.4*  NEUTROABS 2.0 1.6* 1.2*  HGB 10.6* 10.6*  9.6*  HCT 31.4* 30.9* 28.0*  MCV 92.1 90.9 92.7  PLT 291 368 358   Microbiology Recent Results (from the past 240 hour(s))  RAPID STREP SCREEN     Status: Normal   Collection Time   06/05/11  6:10 AM      Component Value Range Status Comment   Streptococcus, Group A Screen (Direct) NEGATIVE  NEGATIVE  Final      Brief H and P: For complete details please refer to admission H and P, but in brief, [name] is a 46 year old man with a past medical history of tonsillar squamous cell carcinoma status post chemotherapy and radiation therapy who presented to the hospital with nausea, vomiting, and generalized weakness on 06/02/2011.    Physical Exam at Discharge: BP 113/78  Pulse 81  Temp(Src) 98.7 F (37.1 C) (Oral)  Resp 16  Ht 5\' 8"  (1.727 m)   Wt 86.183 kg (190 lb)  BMI 28.89 kg/m2  SpO2 97% Gen: NAD  Cardiovascular: RRR, No M/R/G  Respiratory: Lungs CTAB  Gastrointestinal: Abdomen soft, NT/ND with normal active bowel sounds.  Extremities: No C/E/C  Hospital Course:  Principal Problem:  *Nausea & vomiting with xerostomia  Esophageal stricture ruled out with EGD.  Not felt to be chemotherapy induced since his last chemotherapy was 04/28/2011.  Reglan started to address any component of dysmotility.  Continue Robitussin for mucolysis. Kyle Hendrix recommends avoiding scopolamine and atropine for now. Told to use Atropine sparingly for secretion control. Active Problems:  Right tonsillar squamous cell carcinoma  Seen by Kyle Hendrix on 06/08/2011. Completed radiation/chemotherapy with last dose of chemotherapy on 04/28/2011. Anemia of chronic disease  Likely related to anemia of chronic disease in the setting of recent chemotherapy. Dehydration  Resolved with IV fluids. Leukopenia  Likely related to prior chemotherapy. White blood cell count stable. Erosive esophagitis / moderate protein calorie malnutrition  Seen and evaluated by GI who performed an EGD on 06/09/11 which showed erosive esophagitis.  Continue Carafate, PPI.  The patient has a PEG tube and can receive supplementation therapy for this if needed.  Ensure supplements ordered by dietitian. `Amnesia/ Focal Weakness  Unclear etiology. Patient had a CT scan of his head which was negative. No cause identified by Kyle Hendrix.   Diet:  Regular  Activity:  Increase activity slowly  Condition at Discharge:   Improved  Time spent on Discharge:  35 minutes  Signed: Dr. Trula Ore Steffan Hendrix Pager [phone] 06/11/2011, 1:47 PM

## 2011-06-11 NOTE — Discharge Instructions (Signed)
Cyclic Vomiting Syndrome    Cyclic vomiting syndrome (CVS) is a benign condition in which patients experience bouts or cycles of severe nausea and vomiting that last for hours or even days. The bouts of nausea and vomiting alternate with longer periods of no symptoms and generally good health. CVS occurs mostly in children, but can affect adults. CVS has no known cause. Each episode is typically similar to the previous ones. The episodes tend to:   Start at about the same time of day.   Last the same length of time.   Present the same symptoms at the same level of intensity.  CVS can begin at any age in children and adults. CVS usually starts between the ages of 3 and 7. In adults, episodes tend to occur less often than they do in children, but they last longer. Furthermore, the events or situations that trigger episodes in adults cannot always be pinpointed as easily as they can in children. THE FOUR PHASES OF CVS 1. Prodrome.  2. Episode.  3. Recovery.  4. Symptom-free interval.  The prodrome phase signals that an episode of nausea and vomiting is about to begin. This phase can last from just a few minutes to several hours. This phase is often marked by belly (abdominal) pain. Sometimes taking medicine early in the prodrome phase can stop an episode in progress. However, sometimes there is no warning. A person may simply wake up in the middle of the night or early morning and begin vomiting. The episode phase consists of:  Severe vomiting.   Nausea.   Gagging (retching).  The recovery phase begins when the nausea and vomiting stop. Healthy color, appetite, and energy return. The symptom-free interval phase is the period between episodes when no symptoms are present. TRIGGERS Episodes can be triggered by an infection or event. Examples of triggers include:  Infections.   Colds, allergies, sinus problems, and the flu.   Eating certain foods such as chocolate or cheese.   Foods  with MSG or preservatives.   Fast foods.   Pre-packaged foods.   Foods with low nutritional value (junk foods).   Overeating.   Eating just before going to bed.   Hot weather.   Dehydration.   Not enough sleep or poor sleep quality.   Physical exhaustion.   Menstruation.   Motion sickness.   Emotional stress (school or home difficulties).   Excitement or stress.  SYMPTOMS  The main symptoms of CVS are:  Severe vomiting.   Nausea.   Gagging (retching).  Episodes usually begin at night or the first thing in the morning. Episodes may include vomiting or retching up to 5 or 6 times an hour during the worst of the episode. Episodes usually last anywhere from 1 to 4 days. Episodes can last for up to 10 days. Other symptoms include:  Paleness.   Exhaustion.   Listlessness.   Abdominal pain.   Loose stools or diarrhea.  Sometimes the nausea and vomiting are so severe that a person appears to be almost unconscious. Sensitivity to light, headache, fever, dizziness, may also accompany an episode. In addition, the vomiting may cause drooling and excessive thirst. Drinking water usually leads to more vomiting, though the water can dilute the acid in the vomit, making the episode a little less painful. Continuous vomiting can lead to dehydration, which means that the body has lost excessive water and salts. DIAGNOSIS  CVS is hard to diagnose because there are no clear tests to identify it.   A caregiver must diagnose CVS by looking at symptoms and medical history. A caregiver must exclude more common diseases or disorders that can also cause nausea and vomiting. Also, diagnosis takes time because caregivers need to identify a pattern or cycle to the vomiting. CVS AND MIGRAINE The relationship between migraine and CVS is still unclear. Medical researchers believe that they are related for 3 reasons: 1. Migraine headaches (which cause severe pain in the head), abdominal migraine  (which causes stomach pain), and CVS are all marked by severe symptoms that start quickly and end abruptly, followed by longer periods without pain or other symptoms.  2. Many of the situations that trigger CVS also trigger migraines. Those triggers include stress and excitement.  3. Research has shown that many children with CVS either have a family history of migraine or develop migraines as they grow older.  Because of the similarities between migraine and CVS, caregivers treat some people with severe CVS with drugs that are also used for migraine headaches. The drugs are designed to:  Prevent episodes.   Reduce their frequency.   Lessen their severity.  TREATMENT  CVS cannot be cured. Treatment varies, but people with CVS should get plenty of rest and sleep and take medications that prevent, stop, or lessen the vomiting episodes and other symptoms. Once a vomiting episode begins, treatment is supportive. It helps to stay in bed and sleep in a dark, quiet room. Severe nausea and vomiting may require hospitalization and intravenous (IV) fluids to prevent dehydration. Relaxing medications (sedatives) may help if the nausea continues. Sometimes, during the prodrome phase, it is possible to stop an episode from happening altogether. Only take over-the-counter or prescription medicines for pain, discomfort or fever as directed by your caregiver. Do not give aspirin to children. During the recovery phase, drinking water and replacing lost electrolytes (salts in the blood) are very important. Electrolytes are salts that the body needs to function well and stay healthy. Symptoms during the recovery phase can vary. Some people find that their appetites return to normal immediately, while others need to begin by drinking clear liquids and then move slowly to solid food. People whose episodes are frequent and long-lasting may be treated during the symptom-free intervals in an effort to prevent or ease future  episodes. Medications that help people with migraine headaches are sometimes used during this phase, but they do not work for everyone. Taking the medicine daily for 1 to 2 months may be necessary to see if it helps. The symptom-free phase is a good time to eliminate anything known to trigger an episode. For example, if episodes are brought on by stress or excitement, this period is the time to find ways to reduce stress and stay calm. If sinus problems or allergies cause episodes, those conditions should be treated. The triggers listed above should be avoided or prevented. RELATED COMPLICATIONS The severe vomiting that defines CVS is a risk factor for several complications:  Dehydration. Vomiting causes the body to lose water quickly.   Electrolyte imbalance. Vomiting also causes the body to lose the important salts it needs to keep working properly.   Peptic esophagitis. The tube that connects the mouth to the stomach (esophagus) becomes injured from the stomach acid that comes up with the vomit.   Hematemesis. The esophagus becomes irritated and bleeds, so blood mixes with the vomit.   Mallory-Weiss tear. The lower end of the esophagus may tear open or the stomach may bruise from vomiting or retching.     Tooth decay. The acid in the vomit can hurt the teeth by corroding the tooth enamel.  SEEK MEDICAL CARE IF: You have questions or problems. Document Released: 03/02/2001 Document Revised: 12/11/2010 Document Reviewed: 03/31/2010 [employer] Patient Information 2012 [employer]. 

## 2011-06-12 ENCOUNTER — Encounter: Payer: Self-pay | Admitting: Oncology

## 2011-06-12 NOTE — Progress Notes (Signed)
Patient received four prescriptions from [facility] on 06/11/11 $21.82,his remaninig balance [facility] $119.26.

## 2011-06-17 ENCOUNTER — Encounter: Payer: Self-pay | Admitting: *Deleted

## 2011-06-17 NOTE — Progress Notes (Signed)
Clinical Social Work contacted patient to follow up from hospital admission and assess for psychosocial needs.  Kyle Hendrix states he is physically feeling better since being discharged from the hospital and [place] currently at the beach with [place].  He states he is enjoying rest and relaxation at the beach.  Patient and CSW arranged to meet on 06/24/11 after his appointment with Dr. Gaylyn Rong for supportive counseling.  Kathrin Penner, MSW, [facility] Clinical Social Worker [facility] [phone]

## 2011-06-18 ENCOUNTER — Encounter: Payer: Self-pay | Admitting: Radiation Oncology

## 2011-06-22 ENCOUNTER — Ambulatory Visit: Payer: BC Managed Care – PPO | Attending: Radiation Oncology

## 2011-06-22 ENCOUNTER — Ambulatory Visit: Payer: BC Managed Care – PPO

## 2011-06-22 DIAGNOSIS — R1310 Dysphagia, unspecified: Secondary | ICD-10-CM | POA: Insufficient documentation

## 2011-06-22 DIAGNOSIS — IMO0001 Reserved for inherently not codable concepts without codable children: Secondary | ICD-10-CM | POA: Insufficient documentation

## 2011-06-23 NOTE — Patient Instructions (Addendum)
A.  Head/neck cancer:  Finished chemoradiation.   CONGRATULATIONS! B.  Let's concentrate on recovering:  Strength, eating more soft foods with goal to try solid food by August.  C.  Follow up PET scan late September 2013 to assess response.   This will be about 4 months about from the finish of therapy.  If PET is obtained too early, there can be false positive and then patients undergo needless surgery.  D. Let's keep PEG tube until after PET scan result.

## 2011-06-24 ENCOUNTER — Encounter (HOSPITAL_COMMUNITY): Payer: Self-pay | Admitting: Dentistry

## 2011-06-24 ENCOUNTER — Encounter: Payer: Self-pay | Admitting: Oncology

## 2011-06-24 ENCOUNTER — Ambulatory Visit (HOSPITAL_COMMUNITY): Payer: Self-pay | Admitting: Dentistry

## 2011-06-24 ENCOUNTER — Telehealth: Payer: Self-pay | Admitting: Oncology

## 2011-06-24 ENCOUNTER — Ambulatory Visit (HOSPITAL_BASED_OUTPATIENT_CLINIC_OR_DEPARTMENT_OTHER): Payer: BC Managed Care – PPO | Admitting: Oncology

## 2011-06-24 VITALS — BP 121/81 | HR 97 | Temp 96.9°F | Ht 68.0 in | Wt 183.9 lb

## 2011-06-24 VITALS — BP 109/73 | HR 79 | Temp 97.8°F

## 2011-06-24 DIAGNOSIS — R1310 Dysphagia, unspecified: Secondary | ICD-10-CM

## 2011-06-24 DIAGNOSIS — E46 Unspecified protein-calorie malnutrition: Secondary | ICD-10-CM

## 2011-06-24 DIAGNOSIS — K1230 Oral mucositis (ulcerative), unspecified: Secondary | ICD-10-CM

## 2011-06-24 DIAGNOSIS — M278 Other specified diseases of jaws: Secondary | ICD-10-CM

## 2011-06-24 DIAGNOSIS — C099 Malignant neoplasm of tonsil, unspecified: Secondary | ICD-10-CM

## 2011-06-24 DIAGNOSIS — K1370 Unspecified lesions of oral mucosa: Secondary | ICD-10-CM

## 2011-06-24 DIAGNOSIS — K117 Disturbances of salivary secretion: Secondary | ICD-10-CM

## 2011-06-24 DIAGNOSIS — K121 Other forms of stomatitis: Secondary | ICD-10-CM

## 2011-06-24 DIAGNOSIS — B977 Papillomavirus as the cause of diseases classified elsewhere: Secondary | ICD-10-CM

## 2011-06-24 NOTE — Progress Notes (Signed)
Wednesday, June 24, 2011  BP:109/73                    P:  49            T: 97.8            Wgt: 183 lbs now at start 236 lbs  Kyle Hendrix is a 46 year old male previously diagnosed with SCCa of the Right Tonsil.  Patient underwent chemoradiation therapy from April, 2013 thru May 24, 2101. Patient now presents for a periodic oral examination after radiation therapy.     REVIEW OF CHIEF COMPLAINTS: DRY MOUTH: Yes moderately dry, getting a little better. HARD TO SWALLOW: "Just a little, not too much". HURT TO SWALLOW:  No TASTE CHANGES: He has his taste back completely. SORES IN MOUTH: Yes- Right lateral tongue. TRISMUS SYMPTOMS: Having no problems with trismus SYMPTOM RELIEF:  Using a fluoride Rinse from Dr. Cyndy Freeze, baking soda and salt mix, and Biotene  HOME ORAL HYGIENE REGIMEN:  BRUSHING: 3 X - 4 X a day FLOSSING: 1 X a day RINSING: See above FLUORIDE: Doing fluoride at night having no problems at this time. TRISMUS EXERCISES: Maximum interincisal opening: 40 mm.   DENTAL EXAM: ORAL HYGIENE(PLAQUE): Food oral hygiene. LOCATION OF MUCOSITIS: Right lateral tongue. DESCRIPTION OF SALIVA: Decreased and foamy. Moderate xerostomia. ANY EXPOSED BONE: Yes. Mandibular right spicule 2 x 2 mm in lingual, retromolar area. OTHER WATCHED AREAS: Previous extraction sites.  DIAGNOSES: 1.  Xerostomia  2.  Dysphagia 3.  Mucositis 4. Traumatic ulceration of right lateral tongue from rubbing against bony spicule. 5. Exposed bone  Procedure: Discussed risks, benefits, and complications of removal of mandibular right bony spicule.  Patient agrees to proceed with removal today. Topical anesthetic applied to the area. Spicule removed with rongeur and bone file. Irrigation with sterile saline. 30 second chlorhexidine rinse. Patient tolerated procedure well.  RTC in two weeks for re-evaluation of healing.  Consider referral to Dr. Retta Mac if exposed bone persists.  RECOMMENDATIONS: 1. Brush after  meals and at bedtime. Use fluoride at bedtime. 2. Use trismus exercises as directed. 3. Use Biotene Rinse or salt water/baking soda rinses. 4. Multiple sips of water as needed. 5. F/U with Dr. Charline Bills and Dr. Darnelle Bos as scheduled. Call if problems before then.   Dr. Cindra Eves

## 2011-06-24 NOTE — Progress Notes (Signed)
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   PAST THERAPY: Concurrent chemoradiation with q3wk Cisplatin 100mg /m2 and daily radiation between 4/13 and 5/13.   CURRENT THERAPY: watchful observation.   INTERVAL HISTORY: Kyle Hendrix 46 y.o. male returns for regular follow up by himself.  He reports feeling much better than last month.  His stamina and taste are coming back.  He has been able to eat steak and pizza without dysphagia, odynophagia. His xerostomia is improved slightly.  He does not have mucositis pain anymore.  He no longer takes nausea medications.   He still uses his PEG tube since he cannot drink Ensue since it's too sweet and thick.  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   Patient denies fever, anorexia, weight loss, fatigue, headache, visual changes, confusion, drenching night sweats, palpable lymph node swelling, mucositis, odynophagia, dysphagia, nausea vomiting, jaundice, chest pain, palpitation, shortness of breath, dyspnea on exertion, productive cough, gum bleeding, epistaxis, hematemesis, hemoptysis, abdominal pain, abdominal swelling, early satiety, melena, hematochezia, hematuria, skin rash, spontaneous bleeding, joint swelling, joint pain, heat or cold intolerance, bowel bladder incontinence, back pain, focal motor weakness, paresthesia, depression, suicidal or homocidal ideation, feeling hopelessness.     Past Medical History  Diagnosis Date  . Nasal polyps   . 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 Depression   . Fatigue   . Degeneration of lumbar intervertebral disc   . Allergy     seasonal  . Asthma     seasonal  . 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 TMJ  (dislocation of temporomandibular joint)   . Sleep apnea   . Neutropenia 06/02/2011  . History of radiation therapy 05/25/11 eot    tonsil    Past Surgical History  Procedure Date  . Anterior release vertebral body w/ posterior fusion     lumbar  . Back surgery 09/1999    L4-5 fusion  . Multiple tooth extractions March 2013    6 teeth removed  . Peg placement March 2013    Placed at [place] [facility]  . Panendoscopy 03/18/2011    Procedure: PANENDOSCOPY;  Surgeon: Flo Shanks, MD;  Location: [facility] OR;  Service: ENT;  Laterality: N/A;  PANDENDONOSCOPY  . Tonsillectomy 03/18/2011    Procedure: TONSILLECTOMY;  Surgeon: Flo Shanks, MD;  Location: [facility] OR;  Service: ENT;  Laterality: Right;  . Esophagogastroduodenoscopy 06/09/2011    Procedure: ESOPHAGOGASTRODUODENOSCOPY (EGD);  Surgeon: Barrie Folk, MD;  Location: [facility];  Service: Endoscopy;  Laterality: N/A;    Current Outpatient Prescriptions  Medication Sig Dispense Refill  . Diphenhyd-Hydrocort-Nystatin SUSP Use as directed 15 mLs in the mouth or throat 4 (four) times daily as needed (Swish, gargle and spit as needed for mouth sores).  500 mL  3  . guaiFENesin (ROBITUSSIN) 100 MG/5ML SOLN Take 10 mLs by mouth every 3 (three) hours as needed.      Marland Kitchen HYDROcodone-acetaminophen (LORTAB) 7.5-500 MG/15ML solution Take 15 mLs by mouth every 6 (six) hours as needed.       Marland Kitchen LORazepam (ATIVAN) 0.5 MG tablet Take 1 tablet (0.5 mg total) by mouth every 6 (six) hours as needed (Nausea or vomiting).  30 tablet  0  . omeprazole (  PRILOSEC) 20 MG capsule Take 1 capsule (20 mg total) by mouth daily.  30 capsule  2  . ondansetron (ZOFRAN-ODT) 8 MG disintegrating tablet Take 1 tablet (8 mg total) by mouth every 8 (eight) hours as needed. For nausea  20 tablet  2  . prochlorperazine (COMPAZINE) 10 MG tablet Take 10 mg by mouth every 6 (six) hours as needed.      . promethazine (PHENERGAN) 25 MG suppository Place 1 suppository (25 mg total) rectally 2 (two)  times daily as needed for nausea.  24 each  1  . sertraline (ZOLOFT) 100 MG tablet Take 100 mg by mouth daily.        ALLERGIES:  is allergic to contrast media; iodine; phenergan; shellfish allergy; betadine; avelox; morphine and related; prednisone; and ultram.  REVIEW OF SYSTEMS:  The rest of the 14-point review of system was negative.   Filed Vitals:   06/24/11 0918  BP: 121/81  Pulse: 97  Temp: 96.9 F (36.1 C)   Wt Readings from Last 3 Encounters:  06/24/11 183 lb 14.4 oz (83.416 kg)  06/11/11 190 lb (86.183 kg)  06/11/11 190 lb (86.183 kg)   ECOG Performance status: 0-1  PHYSICAL EXAMINATION:   General:  well-nourished man in no acute distress.  Eyes:  no scleral icterus.  ENT:  There were no oropharyngeal lesions.  Neck was without thyromegaly. Skin in the neck has completely healed without residual skin rash.  Lymphatics:  Negative cervical, supraclavicular or axillary adenopathy.  Respiratory: lungs were clear bilaterally without wheezing or crackles.  Cardiovascular:  Regular rate and rhythm, S1/S2, without murmur, rub or gallop.  There was no pedal edema.  GI:  abdomen was soft, flat, nontender, nondistended, without organomegaly.  PEG tube was dry/clean/intact.  Muscoloskeletal:  no spinal tenderness of palpation of vertebral spine.  Skin exam was without echymosis, petichae.  Neuro exam was nonfocal.  Patient was able to get on and off exam table without assistance.  Gait was normal.  Patient was alerted and oriented.  Attention was good.   Language was appropriate.  Mood was normal without depression.  Speech was not pressured.  Thought content was not tangential.      LABORATORY/RADIOLOGY DATA:  Lab Results  Component Value Date   WBC 2.9* 06/09/2011   HGB 10.6* 06/09/2011   HCT 31.4* 06/09/2011   PLT 291 06/09/2011   GLUCOSE 107* 06/03/2011   ALKPHOS 74 06/02/2011   ALT 22 06/02/2011   AST 17 06/02/2011   NA 143 06/03/2011   K 3.5 06/03/2011   CL 107 06/03/2011    CREATININE 0.68 06/09/2011   BUN 6 06/03/2011   CO2 26 06/03/2011   INR 1.05 05/18/2011   HGBA1C 5.8* 05/18/2011    Dg Chest 2 View  06/02/2011  *RADIOLOGY REPORT*  Clinical Data: History of tonsillar carcinoma, some throat pain  CHEST - 2 VIEW  Comparison: Chest x-ray of 05/17/2011  Findings: The lungs are clear.  Mediastinal contours appear stable. The heart is within normal limits in size.  No bony abnormality is seen.  IMPRESSION: No active lung disease.  Original Report Authenticated By: Juline Patch, M.D.   Ct Head Wo Contrast  06/05/2011  *RADIOLOGY REPORT*  Clinical Data: Sudden onset right-sided weakness, nausea and vomiting, frontal and left temporal headache, and right-sided weakness.  CT HEAD WITHOUT CONTRAST  Technique:  Contiguous axial images were obtained from the base of the skull through the vertex without contrast.  Comparison: 01/06/2011from [facility].  Findings: The ventricles and sulci are symmetrical without significant effacement, displacement, or dilatation. No mass effect or midline shift. No abnormal extra-axial fluid collections. The grey-white matter junction is distinct. Basal cisterns are not effaced. No acute intracranial hemorrhage. No depressed skull fractures.  Opacification of some of the ethmoid air cells.  Air- fluid levels seen previously in the sphenoid sinus has resolved. Otherwise, no change.  IMPRESSION: No acute intracranial abnormalities demonstrated.  Original Report Authenticated By: Marlon Pel, M.D.    ASSESSMENT AND PLAN:    1. 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 S/p chemoradiation.  He has clinical disease response on exam.  I requested a restaging PET scan for mid Sep 2013 which will be 4 months from the finish of therapy to minimize false positive on PET.  He expressed informed understanding and wished to wait until then.   2. Nausea/vomiting:  much improved compared to before.     3. Mucositis:  Resolved.   4. Xerostomia:  Improving  5. Protein calorie malnutrition: His weight is down from prior; but he has his taste back and he is eating by mouth.  I advised him to work with Vernell Leep to transition to exclusive oral intake by September 2013 with goal to remove PEG tube after result of PET scan.  I advised him to keep record of weekly weight.   6. Follow up: in September 2013 after restaging PET scan.    The length of time of the face-to-face encounter was 15 minutes. More than 50% of time was spent counseling and coordination of care.

## 2011-06-24 NOTE — Telephone Encounter (Signed)
gv pt appt for sept2013.  scheduled pet scan for 09/16 @ [facility]

## 2011-06-26 ENCOUNTER — Ambulatory Visit: Payer: BC Managed Care – PPO | Attending: Radiation Oncology | Admitting: Radiation Oncology

## 2011-06-29 ENCOUNTER — Encounter (HOSPITAL_COMMUNITY): Payer: Self-pay | Admitting: Dentistry

## 2011-06-29 ENCOUNTER — Encounter: Payer: Self-pay | Admitting: *Deleted

## 2011-06-30 NOTE — Progress Notes (Signed)
  Radiation Oncology         [phone]) [phone] ________________________________  Name: Kyle Hendrix MRN: [id number]  Date: 05/25/2011  DOB: 1965/05/13  End of Treatment Note  Diagnosis:   Squamous cell carcinoma of the right tonsil     Indication for treatment:  Curative       Radiation treatment dates:   04/07/2011 through 05/25/2011  Site/dose:   The patient was treated to the high risk region using and IMRT technique on our Tomotherapy unit. The patient was treated to 69.96 gray in 33 fractions. A simultaneous integrated boost technique was used. Daily image guidance was used.  Narrative: The patient had substantial toxicity and some difficulty during the course of his treatment. He experienced significant mucositis and thickened saliva and also had some problems with nausea. This did require hospitalization during his treatment.  Plan: The patient has completed radiation treatment. The patient will return to radiation oncology clinic for routine followup in one month. I advised the patient to call or return sooner if they have any questions or concerns related to their recovery or treatment. ________________________________  Radene Gunning, M.D., Ph.D.

## 2011-06-30 NOTE — Progress Notes (Signed)
   Department of Radiation Oncology  Phone:  [phone] Fax:        [phone] ________________________________  Kyle Hendrix MRN: [id number]  Date: 04/07/2011  DOB: [birth year]   IMRT device note  The patient presented for his first fraction of radiotherapy today. And IMRT device therefore was constructed for this treatment. The patient's IMRT plan was reviewed and this was constructed and will deliver 9.5 field widths. This corresponds to his tomotherapy unit treatment. This IMRT device therefore will be used for each fraction of radiation and this is satisfactory for [place] to proceed with his treatment.   ________________________________   Radene Gunning, MD, PhD

## 2011-07-10 NOTE — Progress Notes (Signed)
Received office notes from Dr. Karol Wolicki @ [facility]; forwarded to Dr. Ha. 

## 2011-07-13 ENCOUNTER — Telehealth: Payer: Self-pay | Admitting: *Deleted

## 2011-07-13 ENCOUNTER — Encounter: Payer: Self-pay | Admitting: Oncology

## 2011-07-13 NOTE — Telephone Encounter (Signed)
I discussed this with Natelie last week already.   I prefer that he does not lift more than 20 lb when he has PEG tube in.  I'm concerned that the PEG tube can come out with heavy lifting.    His options:  1.  If he really want to work w/o restriction, IR can remove PEG tube (if he is not using it anymore).   I normally prefer to wait until after the follow up PET scan before moving PEG tube.  2.  If he wants to lift more than 20lb and keep the PEG tube,  he can talk with Rad Onc and IR to see if they have different restriction criteria than me.    Thanks.

## 2011-07-13 NOTE — Telephone Encounter (Signed)
Per Inetta Fermo in Interventional Radiology, she spoke w/ P.A. Who states no restrictions in lifting w/ respect to PEG tube.  Notified Dr. Gaylyn Rong and he says he will lift any restrictions from his standpoint for pt to return to work.  Pt will need to get letter from Radiology w/ regards to any restrictions w/ PEG tube.  Notified pt of above and a new letter from Dr. Gaylyn Rong left out front for pt to pick up.  Read letter to pt on phone and he states it is acceptable. Pt will call IR to request letter to clear him to work w/ regards specifically to PEG tube.

## 2011-07-13 NOTE — Telephone Encounter (Signed)
Pt left VM states his job is requiring clarification of his lifting restrictions by Dr. Gaylyn Rong in a new letter.  Current letter states pt not to lift more than 200 lbs.  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  Is it ok to transfer pt from stretcher to table for example by pulling on lift sheet under patient?  Or is it only ok to assist w/ transferring people less than 200 lbs?  Pt says his job needs more clarification of the lifting restriction.

## 2011-07-15 ENCOUNTER — Encounter (HOSPITAL_COMMUNITY): Payer: Self-pay | Admitting: Dentistry

## 2011-07-15 ENCOUNTER — Ambulatory Visit (HOSPITAL_COMMUNITY): Payer: Self-pay | Admitting: Dentistry

## 2011-07-15 VITALS — BP 108/69 | HR 91 | Temp 98.0°F

## 2011-07-15 DIAGNOSIS — K140 Glossitis: Secondary | ICD-10-CM

## 2011-07-15 DIAGNOSIS — M278 Other specified diseases of jaws: Secondary | ICD-10-CM

## 2011-07-15 DIAGNOSIS — K117 Disturbances of salivary secretion: Secondary | ICD-10-CM

## 2011-07-15 NOTE — Progress Notes (Signed)
.  07/15/2011  Patient:            Kyle Hendrix Date of Birth:  [birth year] MRN:                [id number]  BP 108/69  Pulse 91  Temp 98 F (36.7 C)  Kyle Hendrix is a 46 year old male previously diagnosed with SCCa of the Right Tonsil.  Patient underwent chemoradiation therapy from April, 2013 thru May 24, 2101. Patient now seen on 06/24/11 for a periodic oral examination and partial ostectomy of the lower right mandible. Patient nowpresents for a reevaluation of healing after the partial ostectomy of the lower right mandible.  Subjective: Patient denies having any problems related to the previous presence of the bony spicule and the traumatic tongue ulceration also noted at that time.  Patient indicates that he is going to start working again tomorrow.   Exam: The area of exposed bone is now completely covered by soft tissue. There is no soft tissue ulceration noted involving the right lateral tongue border. Xerostomia is noted as before.  Assessments: 1. Area of previously exposed bone has now completely resolved and is covered by soft tissue. 2. Tongue ulceration is now completely healed. 3. Xerostomia as before  Plan/recommendations: 1. Patient is a followup with his primary dentist for periodic oral examination's and periodontal therapy.  2. Patient to let his primary dentist know if there is any other exposed bone  noted. Alternatively, the patient may call Dental medicine for evaluation of exposed bone as needed.  Patient is dismissed in stable condition..   Dr. Cindra Eves

## 2011-08-13 ENCOUNTER — Encounter (HOSPITAL_COMMUNITY): Payer: Self-pay | Admitting: Vascular Surgery

## 2011-08-13 ENCOUNTER — Emergency Department (HOSPITAL_COMMUNITY): Payer: BC Managed Care – PPO

## 2011-08-13 ENCOUNTER — Emergency Department (HOSPITAL_COMMUNITY)
Admission: EM | Admit: 2011-08-13 | Discharge: 2011-08-13 | Disposition: A | Discharge status: Home / Self Care | Payer: BC Managed Care – PPO | Attending: Emergency Medicine | Admitting: Emergency Medicine

## 2011-08-13 DIAGNOSIS — F988 Other specified behavioral and emotional disorders with onset usually occurring in childhood and adolescence: Secondary | ICD-10-CM | POA: Insufficient documentation

## 2011-08-13 DIAGNOSIS — F329 Major depressive disorder, single episode, unspecified: Secondary | ICD-10-CM | POA: Insufficient documentation

## 2011-08-13 DIAGNOSIS — J45909 Unspecified asthma, uncomplicated: Secondary | ICD-10-CM | POA: Insufficient documentation

## 2011-08-13 DIAGNOSIS — F3289 Other specified depressive episodes: Secondary | ICD-10-CM | POA: Insufficient documentation

## 2011-08-13 DIAGNOSIS — N200 Calculus of kidney: Secondary | ICD-10-CM | POA: Insufficient documentation

## 2011-08-13 DIAGNOSIS — R109 Unspecified abdominal pain: Secondary | ICD-10-CM | POA: Insufficient documentation

## 2011-08-13 DIAGNOSIS — Z85850 Personal history of malignant neoplasm of thyroid: Secondary | ICD-10-CM | POA: Insufficient documentation

## 2011-08-13 HISTORY — DX: Malignant neoplasm of pharynx, unspecified: C14.0

## 2011-08-13 LAB — COMPREHENSIVE METABOLIC PANEL
ALT: 11 U/L (ref 0–53)
Albumin: 4.3 g/dL (ref 3.5–5.2)
Alkaline Phosphatase: 49 U/L (ref 39–117)
BUN: 10 mg/dL (ref 6–23)
Chloride: 101 mEq/L (ref 96–112)
Potassium: 3.1 mEq/L — ABNORMAL LOW (ref 3.5–5.1)
Sodium: 139 mEq/L (ref 135–145)
Total Bilirubin: 0.3 mg/dL (ref 0.3–1.2)

## 2011-08-13 LAB — URINALYSIS, ROUTINE W REFLEX MICROSCOPIC
Leukocytes, UA: NEGATIVE
Nitrite: NEGATIVE
Specific Gravity, Urine: 1.024 (ref 1.005–1.030)
pH: 6.5 (ref 5.0–8.0)

## 2011-08-13 LAB — CBC WITH DIFFERENTIAL/PLATELET
Basophils Relative: 0 % (ref 0–1)
Hemoglobin: 12.2 g/dL — ABNORMAL LOW (ref 13.0–17.0)
Lymphs Abs: 0.7 10*3/uL (ref 0.7–4.0)
MCHC: 34.5 g/dL (ref 30.0–36.0)
Monocytes Relative: 6 % (ref 3–12)
Neutro Abs: 6.4 10*3/uL (ref 1.7–7.7)
Neutrophils Relative %: 84 % — ABNORMAL HIGH (ref 43–77)
Platelets: 209 10*3/uL (ref 150–400)
RBC: 3.93 MIL/uL — ABNORMAL LOW (ref 4.22–5.81)

## 2011-08-13 LAB — URINE MICROSCOPIC-ADD ON

## 2011-08-13 MED ORDER — HYDROMORPHONE HCL PF 1 MG/ML IJ SOLN
1.0000 mg | Freq: Once | INTRAMUSCULAR | Status: AC
  Administered 2011-08-13: 1 mg via INTRAVENOUS
  Filled 2011-08-13: qty 1

## 2011-08-13 MED ORDER — PHENYLEPH-PROMETHAZINE-COD 5-6.25-10 MG/5ML PO SYRP: 5.0000 mL | ORAL_SOLUTION | ORAL | Status: DC | PRN

## 2011-08-13 MED ORDER — SODIUM CHLORIDE 0.9 % IV BOLUS (SEPSIS)
1000.0000 mL | Freq: Once | INTRAVENOUS | Status: AC
  Administered 2011-08-13: 1000 mL via INTRAVENOUS

## 2011-08-13 MED ORDER — NAPROXEN 375 MG PO TABS: 375.0000 mg | ORAL_TABLET | Freq: Two times a day (BID) | ORAL | Status: DC

## 2011-08-13 MED ORDER — KETOROLAC TROMETHAMINE 30 MG/ML IJ SOLN
30.0000 mg | Freq: Once | INTRAMUSCULAR | Status: AC
  Administered 2011-08-13: 30 mg via INTRAVENOUS
  Filled 2011-08-13: qty 1

## 2011-08-13 MED ORDER — POTASSIUM CHLORIDE 20 MEQ/15ML (10%) PO LIQD
40.0000 meq | Freq: Once | ORAL | Status: AC
  Administered 2011-08-13: 40 meq via ORAL
  Filled 2011-08-13: qty 30

## 2011-08-13 MED ORDER — DIPHENHYDRAMINE HCL 50 MG/ML IJ SOLN
25.0000 mg | Freq: Once | INTRAMUSCULAR | Status: AC
  Administered 2011-08-13: 50 mg via INTRAVENOUS
  Filled 2011-08-13: qty 1

## 2011-08-13 MED ORDER — ONDANSETRON 8 MG PO TBDP
8.0000 mg | ORAL_TABLET | Freq: Once | ORAL | Status: AC
  Administered 2011-08-13: 8 mg via ORAL
  Filled 2011-08-13: qty 1

## 2011-08-13 NOTE — ED Provider Notes (Signed)
History     CSN: [id number]  Arrival date & time 08/13/11  1854   First MD Initiated Contact with Patient 08/13/11 2012      Chief Complaint  Patient presents with  . Flank Pain  . Groin Pain    (Consider location/radiation/quality/duration/timing/severity/associated sxs/prior treatment) HPI Comments: Patient is a 46 year old male with a history of throat cancer that finished treatment on May 28 that presents emergency department with a chief complaint of left flank pain.  Onset of symptoms began around 6 p.m. this evening.  Pain is described as 9/10 in severity, constant, gradually worsening, and coming on acutely.  Associated symptoms include nausea and vomiting x2.  The pain is characterized as sharp knife stabbing pain that radiates to the front groin area.  Patient denies any actual abdominal pain, hematuria, change in bowel movements, fever, night sweats, or chills.  Patient has no other complaints this time.  Patient is a 46 y.o. male presenting with flank pain and groin pain. The history is provided by the patient.  Flank Pain Associated symptoms include nausea and vomiting. Pertinent negatives include no abdominal pain, chest pain, chills, coughing, diaphoresis, fatigue, fever, headaches or rash.  Groin Pain Associated symptoms include nausea and vomiting. Pertinent negatives include no abdominal pain, chest pain, chills, coughing, diaphoresis, fatigue, fever, headaches or rash.    Past Medical History  Diagnosis Date  . Nasal polyps   . 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 Depression   . Fatigue   . Degeneration of lumbar intervertebral disc   . Allergy     seasonal  . Asthma     seasonal  . 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 TMJ (dislocation of temporomandibular joint)   . Sleep apnea   . Neutropenia 06/02/2011  . History of radiation therapy 05/25/11 eot    tonsil  . Throat cancer     Past Surgical History    Procedure Date  . Anterior release vertebral body w/ posterior fusion     lumbar  . Back surgery 09/1999    L4-5 fusion  . Multiple tooth extractions 03/16/11    6 teeth removed-Dr. Sharalyn Ink [place]  . Peg placement March 2013    Placed at [place] [facility]  . Panendoscopy 03/18/2011    Procedure: PANENDOSCOPY;  Surgeon: Flo Shanks, MD;  Location: [facility] OR;  Service: ENT;  Laterality: N/A;  PANDENDONOSCOPY  . Tonsillectomy 03/18/2011    Procedure: TONSILLECTOMY;  Surgeon: Flo Shanks, MD;  Location: [facility] OR;  Service: ENT;  Laterality: Right;  . Esophagogastroduodenoscopy 06/09/2011    Procedure: ESOPHAGOGASTRODUODENOSCOPY (EGD);  Surgeon: Barrie Folk, MD;  Location: [facility];  Service: Endoscopy;  Laterality: N/A;    Family History  Problem Relation Age of Onset  . Diabetes Mother   . Hypertension Mother   . Stroke Maternal Grandfather     History  Substance Use Topics  . Smoking status: Never Smoker   . Smokeless tobacco: Former Neurosurgeon    Types: Chew    Quit date: 03/04/1999  . Alcohol Use: Yes     socially      Review of Systems  Constitutional: Negative for fever, chills, diaphoresis and fatigue.  HENT: Negative for ear pain and sinus pressure.   Eyes: Negative for visual disturbance.  Respiratory: Negative for cough.   Cardiovascular: Negative for chest pain.  Gastrointestinal: Positive for nausea and vomiting. Negative for abdominal pain.  Genitourinary: Positive for dysuria, urgency and flank  pain. Negative for difficulty urinating.  Skin: Negative for color change and rash.  Neurological: Negative for dizziness and headaches.  Psychiatric/Behavioral: Negative for confusion.  All other systems reviewed and are negative.    Allergies  Contrast media; Iodine; Phenergan; Shellfish allergy; Betadine; Avelox; Morphine and related; Prednisone; and Ultram  Home Medications   Current Outpatient Rx  Name Route Sig Dispense Refill  . GUAIFENESIN 100 MG/5ML PO SOLN Oral  Take 10 mLs by mouth every 3 (three) hours as needed. cough    . HYDROCODONE-ACETAMINOPHEN 7.5-500 MG/15ML PO SOLN Oral Take 15 mLs by mouth every 6 (six) hours as needed. pain    . SERTRALINE HCL 100 MG PO TABS Oral Take 100 mg by mouth daily.    Marland Kitchen PROMETHAZINE HCL 25 MG RE SUPP Rectal Place 1 suppository (25 mg total) rectally 2 (two) times daily as needed for nausea. 24 each 1    BP 118/82  Pulse 98  Temp 98 F (36.7 C) (Oral)  Resp 20  SpO2 100%  Physical Exam  Nursing note and vitals reviewed. Constitutional: He is oriented to person, place, and time. He appears well-developed and well-nourished. He appears distressed.  HENT:  Head: Normocephalic and atraumatic.  Eyes: Conjunctivae and EOM are normal.  Neck: Normal range of motion. Neck supple.  Cardiovascular: Normal rate, regular rhythm and normal heart sounds.   Pulmonary/Chest: Effort normal.  Abdominal: Soft. Normal appearance and bowel sounds are normal. He exhibits no distension, no ascites and no pulsatile midline mass. There is CVA tenderness.       PEG tube placed.  Musculoskeletal: Normal range of motion.  Neurological: He is alert and oriented to person, place, and time.  Skin: Skin is warm and dry. He is not diaphoretic.  Psychiatric: He has a normal mood and affect. His behavior is normal.    ED Course  Procedures (including critical care time)  Labs Reviewed  CBC WITH DIFFERENTIAL - Abnormal; Notable for the following:    RBC 3.93 (*)     Hemoglobin 12.2 (*)     HCT 35.4 (*)     Neutrophils Relative 84 (*)     Lymphocytes Relative 9 (*)     All other components within normal limits  URINALYSIS, ROUTINE W REFLEX MICROSCOPIC  COMPREHENSIVE METABOLIC PANEL   Ct Abdomen Pelvis Wo Contrast  08/13/2011  *RADIOLOGY REPORT*  Clinical Data: Left lower quadrant and left flank pain. History of tonsillar cancer.  CT ABDOMEN AND PELVIS WITHOUT CONTRAST  Technique:  Multidetector CT imaging of the abdomen and pelvis  was performed following the standard protocol without intravenous contrast.  Comparison: PET CT 02/25/2011.  Findings:  Lung Bases: Unremarkable.  Abdomen/Pelvis:  Image 74 of series 2 demonstrates a 4 mm partially obstructive calculus in the distal third of the left ureter shortly before the left ureterovesicular junction.  There is marked ureteral thickening around the calculus, and there is mild left proximal hydroureteronephrosis.  No additional calculi are identified within the collecting system of either kidney, along the course of the right ureter, or within the lumen of the urinary bladder.  A percutaneous enterogastric tube is in position with tip in the body of the stomach. The unenhanced appearance of the liver, gallbladder, pancreas, spleen and bilateral adrenal glands is unremarkable.  There is no ascites or pneumoperitoneum and no pathologic distension of bowel.  There are a few scattered colonic diverticula, without surrounding inflammatory changes to suggest an acute diverticulitis.  Prostate and urinary bladder are  unremarkable in appearance.  No definite pathologic lymphadenopathy identified within the abdomen or pelvis on this noncontrast CT examination.  Musculoskeletal: There are no aggressive appearing lytic or blastic lesions noted in the visualized portions of the skeleton.  Status post L4 laminectomy and PLIF at L4-L5 with an interbody graft at the L4-L5 interspace.  IMPRESSION: 1.  Partially obstructing 4 mm calculus in the distal third of the left ureter immediately proximal to the left UVJ with mild proximal left hydroureteronephrosis. 2.  Mild colonic diverticulosis without findings to suggest acute diverticulitis. 3.  Postoperative changes and support apparatus, as above.  Original Report Authenticated By: Florencia Reasons, M.D.     No diagnosis found.    MDM  Kidney stone Diverticulosis, mild Hypokalemia   Pt has been diagnosed with a Kidney Stone via CT, findings of  diverticulosis discussed with patient as well. There is no evidence of significant hydronephrosis, serum creatine WNL, vitals sign stable and the pt does not have irratractable vomiting. Potassium given. Pt will be dc home with pain medications & has been advised to follow up with PCP.          Jaci Carrel, [place] 08/13/11 2146  Jaci Carrel, PA-C 08/13/11 2214

## 2011-08-13 NOTE — ED Notes (Signed)
Pt has been having episodes of sharp, stabbing flank pain that radiates to the groin x 2 weeks tonight became very severe and was associated with vomiting.

## 2011-08-14 NOTE — ED Provider Notes (Signed)
Medical screening examination/treatment/procedure(s) were performed by non-physician practitioner and as supervising physician I was immediately available for consultation/collaboration.  Derwood Kaplan, MD 08/14/11 0030

## 2011-09-21 ENCOUNTER — Encounter (HOSPITAL_COMMUNITY)
Admission: RE | Admit: 2011-09-21 | Discharge: 2011-09-21 | Disposition: A | Discharge status: Home / Self Care | Payer: BC Managed Care – PPO | Source: Ambulatory Visit | Attending: Oncology | Admitting: Oncology

## 2011-09-21 DIAGNOSIS — Z9221 Personal history of antineoplastic chemotherapy: Secondary | ICD-10-CM | POA: Insufficient documentation

## 2011-09-21 DIAGNOSIS — C099 Malignant neoplasm of tonsil, unspecified: Secondary | ICD-10-CM | POA: Insufficient documentation

## 2011-09-21 DIAGNOSIS — Z923 Personal history of irradiation: Secondary | ICD-10-CM | POA: Insufficient documentation

## 2011-09-21 LAB — GLUCOSE, CAPILLARY: Glucose-Capillary: 102 mg/dL — ABNORMAL HIGH (ref 70–99)

## 2011-09-21 MED ORDER — FLUDEOXYGLUCOSE F - 18 (FDG) INJECTION
16.5000 | Freq: Once | INTRAVENOUS | Status: AC | PRN
  Administered 2011-09-21: 16.5 via INTRAVENOUS

## 2011-09-24 ENCOUNTER — Telehealth: Payer: Self-pay | Admitting: Oncology

## 2011-09-24 ENCOUNTER — Other Ambulatory Visit (HOSPITAL_BASED_OUTPATIENT_CLINIC_OR_DEPARTMENT_OTHER): Payer: BC Managed Care – PPO | Admitting: Lab

## 2011-09-24 ENCOUNTER — Ambulatory Visit (HOSPITAL_BASED_OUTPATIENT_CLINIC_OR_DEPARTMENT_OTHER): Payer: BC Managed Care – PPO | Admitting: Oncology

## 2011-09-24 ENCOUNTER — Encounter: Payer: Self-pay | Admitting: Oncology

## 2011-09-24 VITALS — BP 112/75 | HR 82 | Temp 98.0°F | Resp 20 | Ht 68.0 in | Wt 177.2 lb

## 2011-09-24 DIAGNOSIS — B977 Papillomavirus as the cause of diseases classified elsewhere: Secondary | ICD-10-CM

## 2011-09-24 DIAGNOSIS — C099 Malignant neoplasm of tonsil, unspecified: Secondary | ICD-10-CM

## 2011-09-24 DIAGNOSIS — J984 Other disorders of lung: Secondary | ICD-10-CM | POA: Insufficient documentation

## 2011-09-24 DIAGNOSIS — E46 Unspecified protein-calorie malnutrition: Secondary | ICD-10-CM

## 2011-09-24 DIAGNOSIS — Z87891 Personal history of nicotine dependence: Secondary | ICD-10-CM

## 2011-09-24 HISTORY — DX: Other disorders of lung: J98.4

## 2011-09-24 LAB — CBC WITH DIFFERENTIAL/PLATELET
BASO%: 0.6 % (ref 0.0–2.0)
Basophils Absolute: 0 10*3/uL (ref 0.0–0.1)
EOS%: 1.9 % (ref 0.0–7.0)
HGB: 13.2 g/dL (ref 13.0–17.1)
MCH: 31.3 pg (ref 27.2–33.4)
MCHC: 35 g/dL (ref 32.0–36.0)
MONO#: 0.3 10*3/uL (ref 0.1–0.9)
RDW: 12.7 % (ref 11.0–14.6)
WBC: 3.7 10*3/uL — ABNORMAL LOW (ref 4.0–10.3)
lymph#: 0.5 10*3/uL — ABNORMAL LOW (ref 0.9–3.3)

## 2011-09-24 LAB — COMPREHENSIVE METABOLIC PANEL (CC13)
AST: 11 U/L (ref 5–34)
Albumin: 4.3 g/dL (ref 3.5–5.0)
Alkaline Phosphatase: 54 U/L (ref 40–150)
Potassium: 3.8 mEq/L (ref 3.5–5.1)
Sodium: 142 mEq/L (ref 136–145)
Total Protein: 7.1 g/dL (ref 6.4–8.3)

## 2011-09-24 NOTE — Patient Instructions (Addendum)
1.  Diagnosis:  History of head/neck cancer.   2.  PET scan result:  Findings:  Neck: Compared to the most recent PET CT scan there is complete  resolution of hypermetabolic activity in the right neck adenopathy.  The large level II node present on prior is decreased in size  measuring 7 mm compared to 31 mm on prior. No new metabolic  activity in the neck.  Chest: No hypermetabolic mediastinal or hilar nodes. No  suspicious pulmonary nodules on the CT scan. There is new 10 mm  pleural thickening at the right lung apex (image 59) without  significant metabolic activity.  Abdomen/Pelvis: No abnormal hypermetabolic activity within the  liver, pancreas, adrenal glands, or spleen. No hypermetabolic  lymph nodes in the abdomen or pelvis.  Skeleton: No focal hypermetabolic activity to suggest skeletal  metastasis.   IMPRESSION:  1. No hypermetabolic lymph nodes in the neck. Reduction in size of  right lymphadenopathy.  2. No evidence distant metastasis.  3. New pleural thickening at the right lung apex is felt to be  benign. Recommend attention on follow-up.  C.  Follow up:  *  CT chest to follow up scaring of right lung apex (most likely due to radiation) in about 6 months along with neck CT.  *  Return visit to [place] the day after CT in about 6 months.   *  Please make appointment to follow up with Rad Onc and ENT as well.

## 2011-09-24 NOTE — Telephone Encounter (Signed)
Printed and gv appt to pt. °

## 2011-09-24 NOTE — Telephone Encounter (Signed)
Pt aware that IR will call...sed

## 2011-09-24 NOTE — Progress Notes (Signed)
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   PAST THERAPY: Concurrent chemoradiation with q3wk Cisplatin 100mg /m2 and daily radiation between 4/13 and 5/13.   CURRENT THERAPY: watchful observation.   INTERVAL HISTORY: Kyle Hendrix 46 y.o. male returns for regular follow up with his wife.  He has been able to eat my mouth and has not used his PEG tube for about 2 months.  He still has thick phlegm with morning cough occasionally.  He denied dysphagia, odynophagia, node swelling, mucositis.  He is able to eat all kinds of foods without restriction. He has mild fatigue; however, he is working part-time as a Psychiatrist.  Patient denies fever, anorexia, weight loss, fatigue, headache, visual changes, confusion, drenching night sweats, palpable lymph node swelling, mucositis, nausea vomiting, jaundice, chest pain, palpitation, shortness of breath, dyspnea on exertion, gum bleeding, epistaxis, hematemesis, hemoptysis, abdominal pain, abdominal swelling, early satiety, melena, hematochezia, hematuria, skin rash, spontaneous bleeding, joint swelling, joint pain, heat or cold intolerance, bowel bladder incontinence, back pain, focal motor weakness, paresthesia, depression, suicidal or homicidal ideation, feeling hopelessness.   Past Medical History  Diagnosis Date  . Nasal polyps   . 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 Depression   . Fatigue   . Degeneration of lumbar intervertebral disc   . Allergy     seasonal  . Asthma     seasonal  . 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 TMJ (dislocation of temporomandibular joint)   . Sleep apnea   . Neutropenia 06/02/2011  . History of radiation therapy  05/25/11 eot    tonsil  . Throat cancer   . 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 Anterior release vertebral body w/ posterior fusion     lumbar  . Back surgery 09/1999    L4-5 fusion  . Multiple tooth extractions 03/16/11    6 teeth removed-Dr. Sharalyn Ink [place]  . Peg placement March 2013    Placed at [facility]  . Panendoscopy 03/18/2011    Procedure: PANENDOSCOPY;  Surgeon: Flo Shanks, MD;  Location: [facility] OR;  Service: ENT;  Laterality: N/A;  PANDENDONOSCOPY  . Tonsillectomy 03/18/2011    Procedure: TONSILLECTOMY;  Surgeon: Flo Shanks, MD;  Location: [facility] OR;  Service: ENT;  Laterality: Right;  . Esophagogastroduodenoscopy 06/09/2011    Procedure: ESOPHAGOGASTRODUODENOSCOPY (EGD);  Surgeon: Barrie Folk, MD;  Location: [facility];  Service: Endoscopy;  Laterality: N/A;    Current Outpatient Prescriptions  Medication Sig Dispense Refill  . Phenyleph-Promethazine-Cod 5-6.25-10 MG/5ML SYRP Take 5 mLs by mouth every 4 (four) hours as needed.  240 mL  0  . sertraline (ZOLOFT) 100 MG tablet Take 100 mg by mouth daily.        ALLERGIES:  is allergic to contrast media; iodine; phenergan; shellfish allergy; betadine; avelox; morphine and related; prednisone; and ultram.  REVIEW OF SYSTEMS:  The rest of the 14-point review of system was negative.   Filed Vitals:   09/24/11 0933  BP: 112/75  Pulse: 82  Temp: 98 F (36.7 C)  Resp: 20   Wt Readings from Last 3 Encounters:  09/24/11 177 lb 3.2 oz (80.377 kg)  06/24/11 183 lb 14.4 oz (83.416  kg)  06/11/11 190 lb (86.183 kg)   ECOG Performance status: 0-1  PHYSICAL EXAMINATION:   General:  well-nourished man, in no acute distress.  Eyes:  no scleral icterus.  ENT:  There were no oropharyngeal lesions.  Neck was without thyromegaly.  Lymphatics:  Negative cervical, supraclavicular or axillary adenopathy.  Respiratory: lungs were clear bilaterally without wheezing or crackles.  Cardiovascular:   Regular rate and rhythm, S1/S2, without murmur, rub or gallop.  There was no pedal edema.  GI:  abdomen was soft, flat, nontender, nondistended, without organomegaly.  PEG tube was dry, clean, intact.  Muscoloskeletal:  no spinal tenderness of palpation of vertebral spine.  Skin exam was without echymosis, petichae.  Neuro exam was nonfocal.  Patient was able to get on and off exam table without assistance.  Gait was normal.  Patient was alerted and oriented.  Attention was good.   Language was appropriate.  Mood was normal without depression.  Speech was not pressured.  Thought content was not tangential.         LABORATORY/RADIOLOGY DATA:  Lab Results  Component Value Date   WBC 3.7* 09/24/2011   HGB 13.2 09/24/2011   HCT 37.8* 09/24/2011   PLT 232 09/24/2011   GLUCOSE 106* 09/24/2011   ALKPHOS 54 09/24/2011   ALT 11 09/24/2011   AST 11 09/24/2011   NA 142 09/24/2011   K 3.8 09/24/2011   CL 103 09/24/2011   CREATININE 0.9 09/24/2011   BUN 13.0 09/24/2011   CO2 28 09/24/2011   INR 1.05 05/18/2011   HGBA1C 5.8* 05/18/2011    Nm Pet Image Restag (ps) Skull Base To Thigh  09/21/2011        *RADIOLOGY REPORT*  Clinical Data: Subsequent treatment strategy for head neck cancer. Patient status post chemotherapy radiation therapy.  NUCLEAR MEDICINE PET SKULL BASE TO THIGH  Fasting Blood Glucose:  8102  Technique:  16.5 mCi F-18 FDG was injected intravenously. CT data was obtained and used for attenuation correction and anatomic localization only.  (This was not acquired as a diagnostic CT examination.) Additional exam technical data entered on technologist worksheet.  Comparison:  PET CT scan 02/25/2011, neck CT 05/18/2011  Findings:  Neck: Compared to the most recent PET CT scan there is complete resolution of hypermetabolic activity in the right neck adenopathy. The large level II  node present on prior is decreased in size measuring 7 mm compared to 31 mm on prior.  No new metabolic activity in the neck.   Chest:  No hypermetabolic mediastinal or hilar nodes.  No suspicious pulmonary nodules on the CT scan.  There is new 10 mm pleural thickening at the right lung apex (image 59) without significant metabolic activity.  Abdomen/Pelvis:  No abnormal hypermetabolic activity within the liver, pancreas, adrenal glands, or spleen.  No hypermetabolic lymph nodes in the abdomen or pelvis.  Skeleton:  No focal hypermetabolic activity to suggest skeletal metastasis.  IMPRESSION: 1.  No hypermetabolic lymph nodes in the neck. Reduction in size of right lymphadenopathy.  2.  No evidence distant metastasis. 3.  New pleural thickening at the right lung apex is felt to be benign.  Recommend attention on follow-up.   Original Report Authenticated By: Genevive Bi, M.D.     ASSESSMENT AND PLAN:   1. 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 S/p chemoradiation. He is in remission per clinical history, physical exam, lab tests, and  PET scan.  I recommended repeat CT of the neck in about 6 months for routine surveillance. 2.  right apical lung scarring: Most recently to radiation effect. He is not symptomatic. His cough is most likely not related to the right apical nodule. I recommended since 6 months to ensure that he does not have progression of the right apical lesion. 2. Nausea/vomiting: much improved compared to before.  3. Mucositis: Resolved.  4. Xerostomia: Improving.  He has not been doing mouth rinses as previously instructed. I advised him to do either hydrogen peroxide at 14 dilution or 1-1 and of viscous lidocaine and Robitussin. 5. Protein calorie malnutrition: His weight is down from prior; but he has been taking oral in the last 2months.  I will refer him to interventional radiology to remove the PEG tube with the next few weeks. 6. Follow up: In about 6 months. I advised him to follow  up with his ears nose and throat  physician in addition to radiation oncology in between visits with [place].     The length of time of the face-to-face encounter was 15 minutes. More than 50% of time was spent counseling and coordination of care.

## 2011-09-30 ENCOUNTER — Ambulatory Visit (HOSPITAL_COMMUNITY)
Admission: RE | Admit: 2011-09-30 | Discharge: 2011-09-30 | Disposition: A | Discharge status: Home / Self Care | Payer: BC Managed Care – PPO | Source: Ambulatory Visit | Attending: Oncology | Admitting: Oncology

## 2011-09-30 ENCOUNTER — Other Ambulatory Visit: Payer: Self-pay | Admitting: Physician Assistant

## 2011-09-30 DIAGNOSIS — C099 Malignant neoplasm of tonsil, unspecified: Secondary | ICD-10-CM

## 2011-09-30 DIAGNOSIS — J984 Other disorders of lung: Secondary | ICD-10-CM | POA: Insufficient documentation

## 2011-09-30 DIAGNOSIS — Z431 Encounter for attention to gastrostomy: Secondary | ICD-10-CM | POA: Insufficient documentation

## 2011-09-30 MED ORDER — LIDOCAINE VISCOUS 2 % MT SOLN
5.0000 mL | Freq: Once | OROMUCOSAL | Status: AC
  Administered 2011-09-30: 5 mL via OROMUCOSAL
  Filled 2011-09-30: qty 5

## 2011-09-30 MED ORDER — DIAZEPAM 2 MG PO TABS: 10.0000 mg | ORAL_TABLET | Freq: Once | ORAL | Status: DC

## 2011-09-30 MED ORDER — DIAZEPAM 5 MG PO TABS
ORAL_TABLET | ORAL | Status: AC
  Filled 2011-09-30: qty 2

## 2011-09-30 MED ORDER — DIAZEPAM 5 MG PO TABS
10.0000 mg | ORAL_TABLET | Freq: Once | ORAL | Status: AC
  Administered 2011-09-30: 10 mg via ORAL

## 2012-02-20 ENCOUNTER — Other Ambulatory Visit: Payer: Self-pay

## 2012-03-22 ENCOUNTER — Ambulatory Visit (HOSPITAL_BASED_OUTPATIENT_CLINIC_OR_DEPARTMENT_OTHER): Payer: No Typology Code available for payment source | Admitting: Lab

## 2012-03-22 ENCOUNTER — Ambulatory Visit (HOSPITAL_COMMUNITY)
Admission: RE | Admit: 2012-03-22 | Discharge: 2012-03-22 | Disposition: A | Discharge status: Home / Self Care | Payer: No Typology Code available for payment source | Source: Ambulatory Visit | Attending: Oncology | Admitting: Oncology

## 2012-03-22 DIAGNOSIS — J984 Other disorders of lung: Secondary | ICD-10-CM

## 2012-03-22 DIAGNOSIS — C099 Malignant neoplasm of tonsil, unspecified: Secondary | ICD-10-CM | POA: Insufficient documentation

## 2012-03-22 DIAGNOSIS — Z923 Personal history of irradiation: Secondary | ICD-10-CM | POA: Insufficient documentation

## 2012-03-22 DIAGNOSIS — Z9221 Personal history of antineoplastic chemotherapy: Secondary | ICD-10-CM | POA: Insufficient documentation

## 2012-03-22 LAB — CBC WITH DIFFERENTIAL/PLATELET
BASO%: 0.4 % (ref 0.0–2.0)
EOS%: 2.3 % (ref 0.0–7.0)
MCH: 31.5 pg (ref 27.2–33.4)
MCHC: 35.7 g/dL (ref 32.0–36.0)
MCV: 88.1 fL (ref 79.3–98.0)
MONO%: 15.8 % — ABNORMAL HIGH (ref 0.0–14.0)
RBC: 4.1 10*6/uL — ABNORMAL LOW (ref 4.20–5.82)
RDW: 13.4 % (ref 11.0–14.6)

## 2012-03-22 LAB — COMPREHENSIVE METABOLIC PANEL (CC13)
AST: 18 U/L (ref 5–34)
Albumin: 3.5 g/dL (ref 3.5–5.0)
Alkaline Phosphatase: 52 U/L (ref 40–150)
BUN: 12.9 mg/dL (ref 7.0–26.0)
Potassium: 3.9 mEq/L (ref 3.5–5.1)
Sodium: 142 mEq/L (ref 136–145)
Total Protein: 6.8 g/dL (ref 6.4–8.3)

## 2012-03-24 ENCOUNTER — Ambulatory Visit: Payer: Self-pay | Admitting: Oncology

## 2012-03-28 ENCOUNTER — Telehealth: Payer: Self-pay | Admitting: Oncology

## 2012-03-28 ENCOUNTER — Encounter: Payer: Self-pay | Admitting: Oncology

## 2012-03-28 ENCOUNTER — Ambulatory Visit (HOSPITAL_BASED_OUTPATIENT_CLINIC_OR_DEPARTMENT_OTHER): Payer: No Typology Code available for payment source | Admitting: Oncology

## 2012-03-28 VITALS — BP 125/79 | HR 78 | Temp 98.0°F | Resp 20 | Ht 68.0 in | Wt 195.6 lb

## 2012-03-28 DIAGNOSIS — B977 Papillomavirus as the cause of diseases classified elsewhere: Secondary | ICD-10-CM

## 2012-03-28 DIAGNOSIS — C099 Malignant neoplasm of tonsil, unspecified: Secondary | ICD-10-CM

## 2012-03-28 DIAGNOSIS — Z87891 Personal history of nicotine dependence: Secondary | ICD-10-CM

## 2012-03-28 NOTE — Telephone Encounter (Signed)
gv and printed appt schedule for tp for Sept....pt aware cs will call for c

## 2012-03-28 NOTE — Progress Notes (Signed)
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   PAST THERAPY: Concurrent chemoradiation with q3wk Cisplatin 100mg /m2 and daily radiation between 4/13 and 5/13.   CURRENT THERAPY: watchful observation.   INTERVAL HISTORY: Kyle Hendrix 47 y.o. male returns for regular follow up by himself.  Eating well and gaining weight. He denied dysphagia, odynophagia, node swelling, mucositis. He has mild fatigue; however, he is working almost full-time as a Psychiatrist.  Patient denies fever, anorexia, weight loss, fatigue, headache, visual changes, confusion, drenching night sweats, palpable lymph node swelling, mucositis, nausea vomiting, jaundice, chest pain, palpitation, shortness of breath, dyspnea on exertion, gum bleeding, epistaxis, hematemesis, hemoptysis, abdominal pain, abdominal swelling, early satiety, melena, hematochezia, hematuria, skin rash, spontaneous bleeding, joint swelling, joint pain, heat or cold intolerance, bowel bladder incontinence, back pain, focal motor weakness, paresthesia, depression, suicidal or homicidal ideation, feeling hopelessness.   Past Medical History  Diagnosis Date  . Nasal polyps   . 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 Depression   . Fatigue   . Degeneration of lumbar intervertebral disc   . Allergy     seasonal  . Asthma     seasonal  . 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 TMJ (dislocation of temporomandibular joint)   . Sleep apnea   . Neutropenia 06/02/2011  . History of radiation therapy 05/25/11 eot    tonsil  . Throat cancer   . 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 Anterior release vertebral body  w/ posterior fusion      lumbar  . Back surgery  09/1999    L4-5 fusion  . Multiple tooth extractions  03/16/11    6 teeth removed-Dr. Sharalyn Ink [place]  . Peg placement  March 2013    Placed at [facility]  . Panendoscopy  03/18/2011    Procedure: PANENDOSCOPY;  Surgeon: Flo Shanks, MD;  Location: [facility] OR;  Service: ENT;  Laterality: N/A;  PANDENDONOSCOPY  . Tonsillectomy  03/18/2011    Procedure: TONSILLECTOMY;  Surgeon: Flo Shanks, MD;  Location: [facility] OR;  Service: ENT;  Laterality: Right;  . Esophagogastroduodenoscopy  06/09/2011    Procedure: ESOPHAGOGASTRODUODENOSCOPY (EGD);  Surgeon: Barrie Folk, MD;  Location: [facility];  Service: Endoscopy;  Laterality: N/A;    Current Outpatient Prescriptions  Medication Sig Dispense Refill  . Phenyleph-Promethazine-Cod 5-6.25-10 MG/5ML SYRP Take 5 mLs by mouth every 4 (four) hours as needed.  240 mL  0  . sertraline (ZOLOFT) 100 MG tablet Take 100 mg by mouth daily.       No current facility-administered medications for this visit.    ALLERGIES:  is allergic to contrast media; iodine; phenergan; shellfish allergy; betadine; avelox; morphine and related; prednisone; and ultram.  REVIEW OF SYSTEMS:  The rest of the 14-point review of system was negative.   Filed Vitals:   03/28/12 0919  BP: 125/79  Pulse: 78  Temp: 98 F (36.7 C)  Resp: 20   Wt Readings from Last 3 Encounters:  03/28/12 195 lb 9.6 oz (88.724 kg)  09/24/11 177 lb 3.2 oz (80.377 kg)  06/24/11 183 lb 14.4 oz (83.416 kg)   ECOG Performance status: 0-1  PHYSICAL EXAMINATION:  General:  well-nourished man, in no acute distress.  Eyes:  no scleral icterus.  ENT:  There were no oropharyngeal lesions.  Neck was without thyromegaly.  Lymphatics:  Negative cervical, supraclavicular or axillary adenopathy.  Respiratory: lungs were clear bilaterally without wheezing or crackles.  Cardiovascular:  Regular rate and rhythm, S1/S2, without murmur, rub or gallop.  There was no  pedal edema.  GI:  abdomen was soft, flat, nontender, nondistended, without organomegaly.  PEG tube was dry, clean, intact.  Muscoloskeletal:  no spinal tenderness of palpation of vertebral spine.  Skin exam was without echymosis, petichae.  Neuro exam was nonfocal.  Patient was able to get on and off exam table without assistance.  Gait was normal.  Patient was alerted and oriented.  Attention was good.   Language was appropriate.  Mood was normal without depression.  Speech was not pressured.  Thought content was not tangential.     LABORATORY/RADIOLOGY DATA:  Lab Results  Component Value Date   WBC 2.8* 03/22/2012   HGB 12.9* 03/22/2012   HCT 36.1* 03/22/2012   PLT 154 03/22/2012   GLUCOSE 103* 03/22/2012   ALKPHOS 52 03/22/2012   ALT 15 03/22/2012   AST 18 03/22/2012   NA 142 03/22/2012   K 3.9 03/22/2012   CL 105 03/22/2012   CREATININE 0.9 03/22/2012   BUN 12.9 03/22/2012   CO2 30* 03/22/2012   INR 1.05 05/18/2011   HGBA1C 5.8* 05/18/2011   IMAGING:  *RADIOLOGY REPORT*  Clinical Data: Right tonsillar squamous cell carcinoma. Post chemo  radiation. Surveillance imaging. Chest CT performed the same date  dictated separately.  CT NECK WITHOUT CONTRAST  Technique: Multidetector CT imaging of the neck was performed  without intravenous contrast.  Comparison: 09/21/2011 PET CT. 05/18/2011 neck CT.  Findings: Lung parenchymal changes most notable right lung apex.  Please see chest CT report performed the same date and dictated  separately.  Post-therapy type changes involving the neck with mild infiltration  of fat planes, small amount of fluid in the retropharyngeal region  and mild thickening of the subglottic/glottic structures have  improved since prior examination.  Scarring at the level of previously noted malignant right level II  lymph node without findings of recurrent lymphadenopathy noted.  Cervical spondylotic changes without bony destructive lesion.  IMPRESSION:  Post-therapy  type changes involving the neck with mild infiltration  of fat planes, small amount of fluid in the retropharyngeal region  and mild thickening of the subglottic/glottic structures have  improved since prior examination.  Scarring at the level of previously noted malignant right level II  lymph node without findings of recurrent lymphadenopathy noted.  Original Report Authenticated By: Lacy Duverney, M.D.  *RADIOLOGY REPORT*  Clinical Data: History of oropharyngeal cancer. Status post  chemotherapy and radiation therapy.  CT CHEST WITHOUT CONTRAST  Technique: Multidetector CT imaging of the chest was performed  following the standard protocol without IV contrast.  Comparison: Nuclear medicine PET CT 09/21/2011  Findings: The thoracic aorta is normal in caliber. Normal heart  size. Thyroid gland and esophagus are unremarkable. No  supraclavicular, axillary, mediastinal, or hilar lymphadenopathy is  identified.  Minimal pleuroparenchymal thickening at the right lung apex is  stable. Negative for pleural or pericardial effusion. Soft  tissues of the chest wall are unremarkable.  The lungs are well expanded and clear. No airspace disease,  pulmonary nodule or mass, or significant interstitial abnormality.  The trachea and mainstem bronchi are patent.  The thoracic spine vertebral bodies  are normal in height and  alignment. No acute or suspicious abnormalities in the bony  thorax.  Incidentally imaged portion of the upper abdomen shows no evidence  of mass or lymphadenopathy.  IMPRESSION:  No evidence of metastatic disease to the chest. Stable mild  pleuroparenchymal thickening at the right lung apex.  Original Report Authenticated By: Britta Mccreedy, M.D.    ASSESSMENT AND PLAN:   1. 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 S/p chemoradiation. He is in remission per clinical history, physical exam, lab  tests, and CT scan.  I recommended repeat CT of the neck in about 6 months for routine surveillance. 2.  Right apical lung scarring: Most recently to radiation effect. Area is stable on CT scan today. 2. Nausea/vomiting: Resolved. 3. Mucositis: Resolved.  4. Xerostomia: Improving.   5. Protein calorie malnutrition: Resolved. 6. Follow up: In about 6 months. I advised him to follow  up with his ears nose and throat physician in addition to radiation oncology in between visits with [place].     The length of time of the face-to-face encounter was 30 minutes. More than 50% of time was spent counseling and coordination of care.

## 2012-06-24 NOTE — Telephone Encounter (Signed)
e

## 2012-09-24 ENCOUNTER — Telehealth: Payer: Self-pay | Admitting: *Deleted

## 2012-09-24 NOTE — Telephone Encounter (Signed)
sw pt made him aware that his for 9/26 is now rs to 10/03/12 @ 1:30pm. i also reminded him of his labs on 9/24@ 8am and CT @ 8:30am. pt is aware...td

## 2012-09-28 ENCOUNTER — Other Ambulatory Visit: Payer: Self-pay

## 2012-09-28 ENCOUNTER — Ambulatory Visit (HOSPITAL_COMMUNITY): Payer: No Typology Code available for payment source

## 2012-09-30 ENCOUNTER — Ambulatory Visit (HOSPITAL_COMMUNITY)
Admission: RE | Admit: 2012-09-30 | Discharge: 2012-09-30 | Disposition: A | Discharge status: Home / Self Care | Payer: No Typology Code available for payment source | Source: Ambulatory Visit | Attending: Oncology | Admitting: Oncology

## 2012-09-30 ENCOUNTER — Ambulatory Visit: Payer: Self-pay

## 2012-09-30 ENCOUNTER — Other Ambulatory Visit: Payer: Self-pay | Admitting: Oncology

## 2012-09-30 ENCOUNTER — Other Ambulatory Visit (HOSPITAL_BASED_OUTPATIENT_CLINIC_OR_DEPARTMENT_OTHER): Payer: No Typology Code available for payment source

## 2012-09-30 DIAGNOSIS — C099 Malignant neoplasm of tonsil, unspecified: Secondary | ICD-10-CM

## 2012-09-30 DIAGNOSIS — Z91041 Radiographic dye allergy status: Secondary | ICD-10-CM | POA: Insufficient documentation

## 2012-09-30 DIAGNOSIS — Z923 Personal history of irradiation: Secondary | ICD-10-CM | POA: Insufficient documentation

## 2012-09-30 DIAGNOSIS — Z9889 Other specified postprocedural states: Secondary | ICD-10-CM | POA: Insufficient documentation

## 2012-09-30 DIAGNOSIS — Z85819 Personal history of malignant neoplasm of unspecified site of lip, oral cavity, and pharynx: Secondary | ICD-10-CM | POA: Insufficient documentation

## 2012-09-30 LAB — COMPREHENSIVE METABOLIC PANEL (CC13)
Alkaline Phosphatase: 62 U/L (ref 40–150)
BUN: 11.9 mg/dL (ref 7.0–26.0)
Creatinine: 1 mg/dL (ref 0.7–1.3)
Glucose: 104 mg/dl (ref 70–140)
Sodium: 142 mEq/L (ref 136–145)
Total Bilirubin: 0.31 mg/dL (ref 0.20–1.20)

## 2012-09-30 LAB — CBC WITH DIFFERENTIAL/PLATELET
Eosinophils Absolute: 0.2 10*3/uL (ref 0.0–0.5)
LYMPH%: 12.8 % — ABNORMAL LOW (ref 14.0–49.0)
MCV: 89.9 fL (ref 79.3–98.0)
MONO%: 10.1 % (ref 0.0–14.0)
NEUT#: 3.7 10*3/uL (ref 1.5–6.5)
Platelets: 188 10*3/uL (ref 140–400)
RBC: 4.42 10*6/uL (ref 4.20–5.82)

## 2012-10-03 ENCOUNTER — Telehealth: Payer: Self-pay | Admitting: Hematology and Oncology

## 2012-10-03 ENCOUNTER — Encounter: Payer: Self-pay | Admitting: Hematology and Oncology

## 2012-10-03 ENCOUNTER — Ambulatory Visit (HOSPITAL_BASED_OUTPATIENT_CLINIC_OR_DEPARTMENT_OTHER): Payer: No Typology Code available for payment source | Admitting: Hematology and Oncology

## 2012-10-03 VITALS — BP 119/80 | HR 80 | Temp 97.9°F | Resp 18 | Ht 68.0 in | Wt 206.4 lb

## 2012-10-03 DIAGNOSIS — B977 Papillomavirus as the cause of diseases classified elsewhere: Secondary | ICD-10-CM

## 2012-10-03 DIAGNOSIS — C099 Malignant neoplasm of tonsil, unspecified: Secondary | ICD-10-CM

## 2012-10-03 NOTE — Telephone Encounter (Signed)
gv pt appt schedule for March 2015. [facility] will call pt w/ct appt - pt aware.

## 2012-10-03 NOTE — Progress Notes (Signed)
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   SUMMARY OF ONCOLOGIC HISTORY: This is a pleasant 47 year old gentleman with the diagnosis of tonsil cancer, when he presented with an abnormal lump. The patient presented with lymph node involvement, HPV positive squamous cell carcinoma by needle biopsy. Between April 2013 to May 2013, he completed concurrent chemoradiation therapy with cisplatin  INTERVAL HISTORY: Kyle Hendrix 47 y.o. male returns for routine followup. He is to have persistent dry mouth, altered taste sensation, and sensitivity to certain food. He continued to have mild weight gain. He denies any dysphagia or lymph node swelling. He had background history of tinnitus and this is not worse since chemotherapy  I have reviewed the past medical history, past surgical history, social history and family history with the patient and they are unchanged from previous note.  ALLERGIES:  is allergic to contrast media; iodine; phenergan; shellfish allergy; betadine; avelox; morphine and related; prednisone; and ultram.  MEDICATIONS: Current outpatient prescriptions:sertraline (ZOLOFT) 100 MG tablet, Take 100 mg by mouth daily., Disp: , Rfl: ;  VYVANSE 70 MG capsule, , Disp: , Rfl: ;  azithromycin (ZITHROMAX) 250 MG tablet, , Disp: , Rfl: ;  HYDROMET 5-1.5 MG/5ML syrup, , Disp: , Rfl:   REVIEW OF SYSTEMS:   Constitutional: Denies fevers, chills or abnormal weight loss Eyes: Denies blurriness of vision Ears, nose, mouth, throat, and face: Denies mucositis or sore throat Respiratory: Denies cough, dyspnea or wheezes Cardiovascular: Denies palpitation, chest discomfort or lower extremity swelling Gastrointestinal:  Denies nausea, heartburn or change in bowel habits Skin: Denies abnormal skin rashes Lymphatics:  Denies new lymphadenopathy or easy bruising Neurological:Denies numbness, tingling or new weaknesses Behavioral/Psych: Mood is stable, no new changes  All other systems were reviewed with the patient and are negative.  PHYSICAL EXAMINATION: ECOG PERFORMANCE STATUS: 0 - Asymptomatic  Filed Vitals:   10/03/12 1349  BP: 119/80  Pulse: 80  Temp: 97.9 F (36.6 C)  Resp: 18   Filed Weights   10/03/12 1349  Weight: 206 lb 6.4 oz (93.622 kg)    GENERAL:alert, no distress and comfortable SKIN: skin color, texture, turgor are normal, no rashes or significant lesions EYES: normal, Conjunctiva are pink and non-injected, sclera clear OROPHARYNX:no exudate, no erythema and lips, buccal mucosa, and tongue normal  NECK: supple, thyroid normal size, non-tender, without nodularity LYMPH:  no palpable lymphadenopathy in the cervical, axillary or inguinal LUNGS: clear to auscultation and percussion with normal breathing effort HEART: regular rate & rhythm and no murmurs and no lower extremity edema ABDOMEN:abdomen soft, non-tender and normal bowel sounds Musculoskeletal:no cyanosis of digits and no clubbing  NEURO: alert & oriented x 3 with fluent speech, no focal motor/sensory deficits  LABORATORY DATA:  I have reviewed the data as listed    Component Value Date/Time   NA 142 09/30/2012 1353   NA 139 08/13/2011 2058   K 4.1 09/30/2012 1353   K 3.1* 08/13/2011 2058   CL 105 03/22/2012 1413   CL 101 08/13/2011 2058   CO2 28 09/30/2012 1353   CO2 27 08/13/2011 2058   GLUCOSE 104 09/30/2012 1353   GLUCOSE 103* 03/22/2012 1413   GLUCOSE 103* 08/13/2011 2058   BUN 11.9 09/30/2012 1353   BUN 10 08/13/2011 2058   CREATININE 1.0 09/30/2012 1353   CREATININE 1.03 08/13/2011 2058  CALCIUM 9.9 09/30/2012 1353   CALCIUM 9.4 08/13/2011 2058   PROT 7.5 09/30/2012 1353   PROT 7.2 08/13/2011 2058   ALBUMIN 3.7 09/30/2012 1353   ALBUMIN 4.3 08/13/2011 2058   AST 17 09/30/2012 1353   AST 13 08/13/2011 2058   ALT 18 09/30/2012  1353   ALT 11 08/13/2011 2058   ALKPHOS 62 09/30/2012 1353   ALKPHOS 49 08/13/2011 2058   BILITOT 0.31 09/30/2012 1353   BILITOT 0.3 08/13/2011 2058   GFRNONAA 87* 08/13/2011 2058   GFRAA >90 08/13/2011 2058    I No results found for this basename: SPEP, UPEP,  kappa and lambda light chains    Lab Results  Component Value Date   WBC 5.1 09/30/2012   NEUTROABS 3.7 09/30/2012   HGB 13.7 09/30/2012   HCT 39.8 09/30/2012   MCV 89.9 09/30/2012   PLT 188 09/30/2012      Chemistry      Component Value Date/Time   NA 142 09/30/2012 1353   NA 139 08/13/2011 2058   K 4.1 09/30/2012 1353   K 3.1* 08/13/2011 2058   CL 105 03/22/2012 1413   CL 101 08/13/2011 2058   CO2 28 09/30/2012 1353   CO2 27 08/13/2011 2058   BUN 11.9 09/30/2012 1353   BUN 10 08/13/2011 2058   CREATININE 1.0 09/30/2012 1353   CREATININE 1.03 08/13/2011 2058      Component Value Date/Time   CALCIUM 9.9 09/30/2012 1353   CALCIUM 9.4 08/13/2011 2058   ALKPHOS 62 09/30/2012 1353   ALKPHOS 49 08/13/2011 2058   AST 17 09/30/2012 1353   AST 13 08/13/2011 2058   ALT 18 09/30/2012 1353   ALT 11 08/13/2011 2058   BILITOT 0.31 09/30/2012 1353   BILITOT 0.3 08/13/2011 2058       RADIOGRAPHIC STUDIES: I have personally reviewed the radiological images as listed and agreed with the findings in the report. I reviewed the imaging study with the patient we show no evidence of disease recurrence  ASSESSMENT: HPV positive tonsil squamous cell carcinoma with no evidence of disease recurrence  PLAN:  #1 squamous cell carcinoma of the tonsil He has no evidence of disease recurrence. I will continue with history, physical examination, and repeat CT scan of the neck without contrast to rule out recurrence. Next year, if he is still disease-free, we will discontinue imaging study #2 history of depression His mood is stable. He will continue his Zoloft. We will monitor his thyroid function test periodically to rule out hypothyroidism as a contributing cause of his  depression #3 preventive care I recommend he continue dentist evaluation every 6 months to rule out cavity is oral poor dentition related to side effects of radiation treatment.  All questions were answered. The patient knows to call the clinic with any problems, questions or concerns. We can certainly see the patient much sooner if necessary. No barriers to learning was detected. I spent 25 minutes counseling the patient face to face. The total time spent in the appointment was 40 minutes and more than 50% was on counseling and review of test results     [facility], Hermilo Dutter, MD 10/03/2012 2:41 PM

## 2012-11-10 ENCOUNTER — Other Ambulatory Visit: Payer: Self-pay

## 2013-03-29 ENCOUNTER — Telehealth: Payer: Self-pay | Admitting: Hematology and Oncology

## 2013-03-29 NOTE — Telephone Encounter (Signed)
RETURNED PT'S CALL RE R/S 3/26 APPT. PT HAS APPTS ON SCHEDULE FOR 3/26 AND 3/30 APPT REMAIN AS IS. PT INFORMED VIA VM AND ASKED TO CALL BACK TO LET us KNOW WHEN HE CAN COME AND IF HE WANTED TO R/S 3/26 AND 3/30.

## 2013-03-30 ENCOUNTER — Ambulatory Visit (HOSPITAL_COMMUNITY): Payer: No Typology Code available for payment source

## 2013-03-30 ENCOUNTER — Other Ambulatory Visit: Payer: Self-pay

## 2013-04-03 ENCOUNTER — Ambulatory Visit: Payer: Self-pay | Admitting: Hematology and Oncology

## 2013-04-07 ENCOUNTER — Other Ambulatory Visit: Payer: Self-pay | Admitting: *Deleted

## 2013-04-10 ENCOUNTER — Telehealth: Payer: Self-pay | Admitting: Hematology and Oncology

## 2013-04-10 NOTE — Telephone Encounter (Signed)
S/w the pt regarding his appts that he missed on 04/03/2013. Per the pt he does not have any insurance at the time and he prefers to wait until he gets that straightened out and then he will call back to make an appointment.

## 2013-05-23 IMAGING — CT NM PET TUM IMG INITIAL (PI) SKULL BASE T - THIGH
1 of 6 series · 1 of 25 positions shown · non-contrast
Comparison: Chest film of 01/10/2009.  No prior CT or MRI.

CLINICAL DATA: Initial treatment strategy for recent biopsy of the
right parotid tumor. Per discussion with clinical office,
nondiagnostic.

NUCLEAR MEDICINE PET CT SKULL BASE TO THIGH
TECHNIQUE: 17.2 mCi F-18 FDG was injected intravenously via the
right AC.  Full-ring PET imaging was performed from the skull base
through the mid-thighs 66  minutes after injection.  CT data was
obtained and used for attenuation correction and anatomic
localization only.  (This was not acquired as a diagnostic CT
examination.)
Fasting Blood Glucose:  101

[Series 1: pet ac · axial · 3.3mm · 4.69mm/px · 1 of 91 slices shown]
[im 46/91]
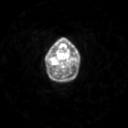

[1 of 25 positions shown; findings below may reference images not displayed]

FINDINGS: PET images demonstrate asymmetric hypermetabolism about
the oral pharynx at the level of the palatine tonsils.  This is
greater on the right than left, measures a S.U.V. max of 16.8.
This is concurrent with soft tissue thickening on image 47 of
series 2.

A centrally necrotic node or conglomerate of nodes within the right
level IIA station measures 3.7 x 3.1 cm and a S.U.V. max of 11.9 on
image 50.  Is felt to separate from and deep to the right parotid
gland.
Immediately posterior right-sided node measures 5 mm and a S.U.V.
max of 3.9 on image 48 of series 2.  Small left sided jugular
nodes.  An index left sided level IIA node measures 7 mm and a
S.U.V. max of 2.3 on image 51.

A left axillary node which measures 7 mm and a S.U.V. max of 2.5 on
image 63 of series 2..
High right paratracheal node which measures 9 mm and a S.U.V. max
of 3.7 on image 61 of series 2.  No abnormal activity within the
abdomen or pelvis.

CT images performed for attenuation correction demonstrate no
further findings within the neck. Mild ethmoid air cell mucosal
thickening.  Mild hepatic steatosis.  Upper pole left renal
calculus, nonobstructive.  Partial fusion of the bilateral
sacroiliac joints.  Prior lumbar spine fixation.
IMPRESSION: 1.  Necrotic hypermetabolic right-sided level IIA nodal mass.  Most
consistent with metastatic disease.
2.  This most likely arises from a right-sided oral pharyngeal
primary with asymmetric soft tissue thickening and hypermetabolism
at this level.
3.  Small left sided jugular chain nodes which are mildly
hypermetabolic and favored to be reactive.
4.  Isolated left axillary and high right mediastinal
hypermetabolic but not pathologically enlarged nodes. The left
axillary node is not in the expected drainage pattern of a head and
neck primary and is favored to be reactive.  Mediastinal node is
equivocal. Recommend attention on follow-up.

## 2013-07-29 IMAGING — CR DG ABDOMEN 1V
1 series · 2 of 2 positions shown · non-contrast
Comparison: 10/25/2011

CLINICAL DATA: Evaluate for ileus

ABDOMEN - 1 VIEW

[Series 1: ap (kub) · U · 2 of 2 slices shown]
[im 1/2]
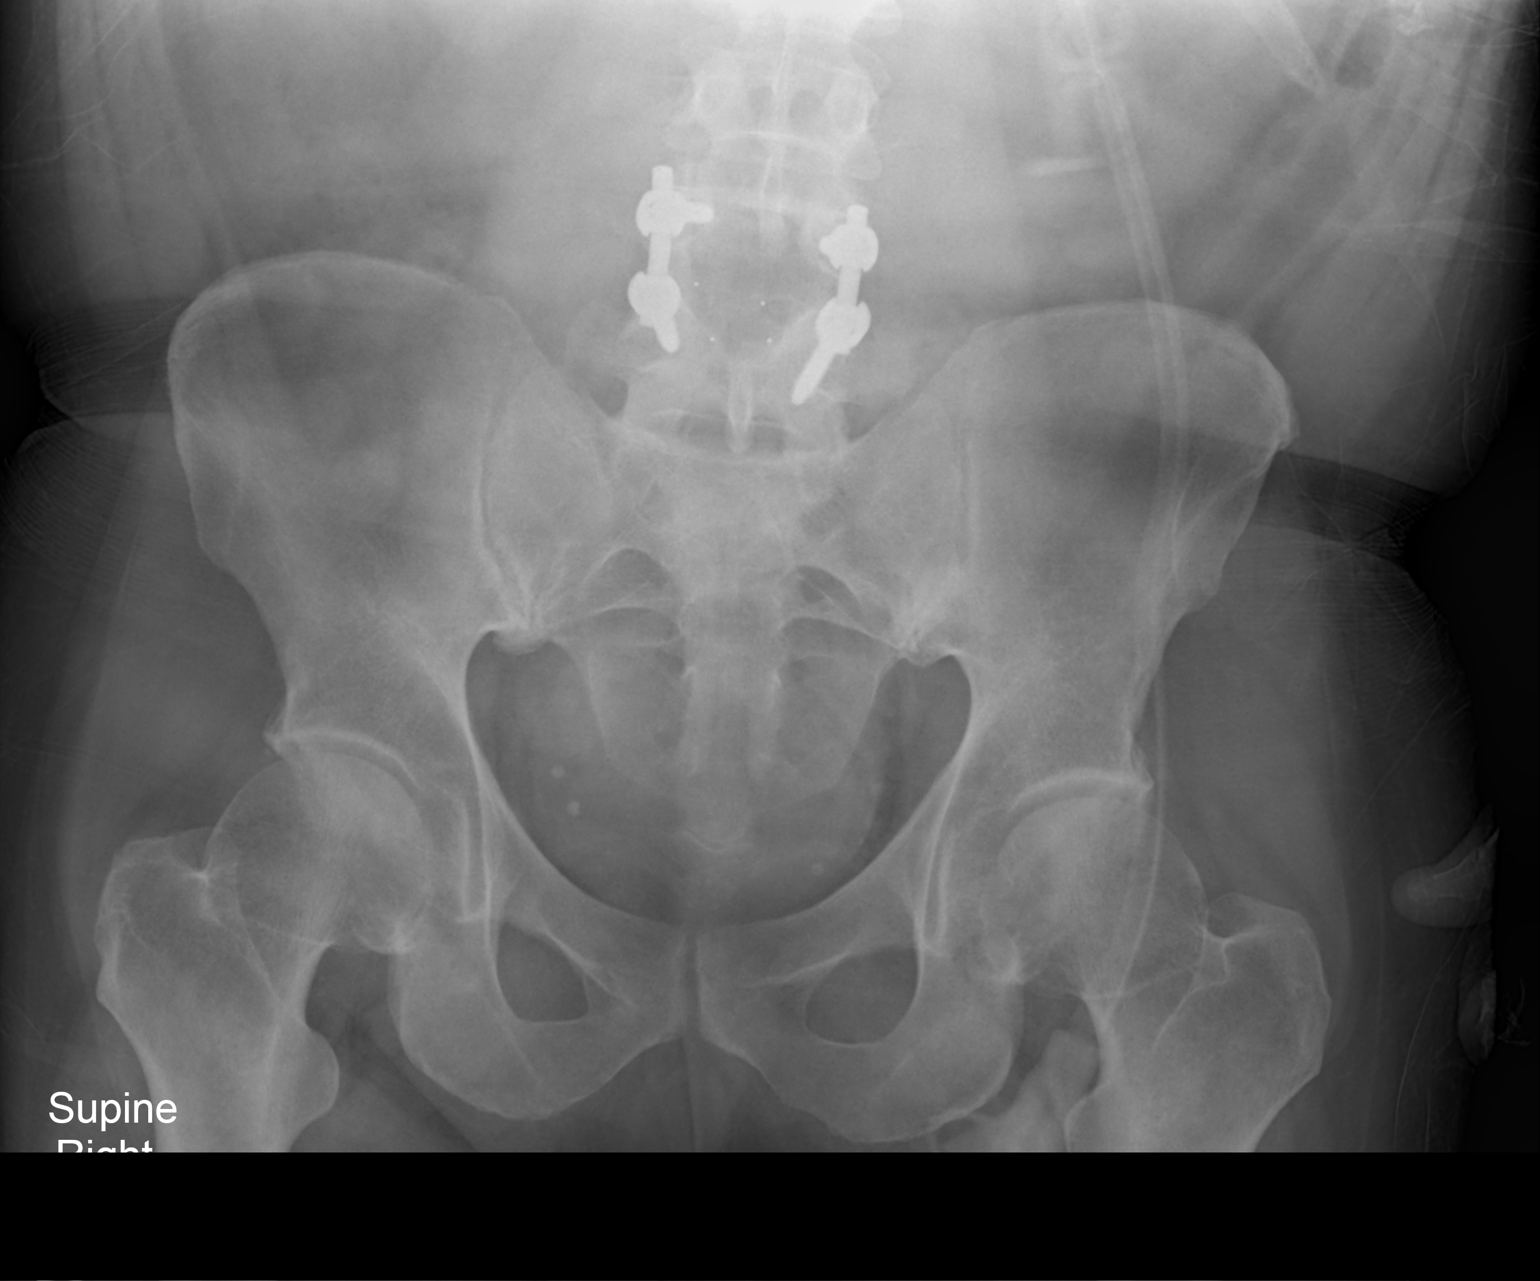
[im 2/2]
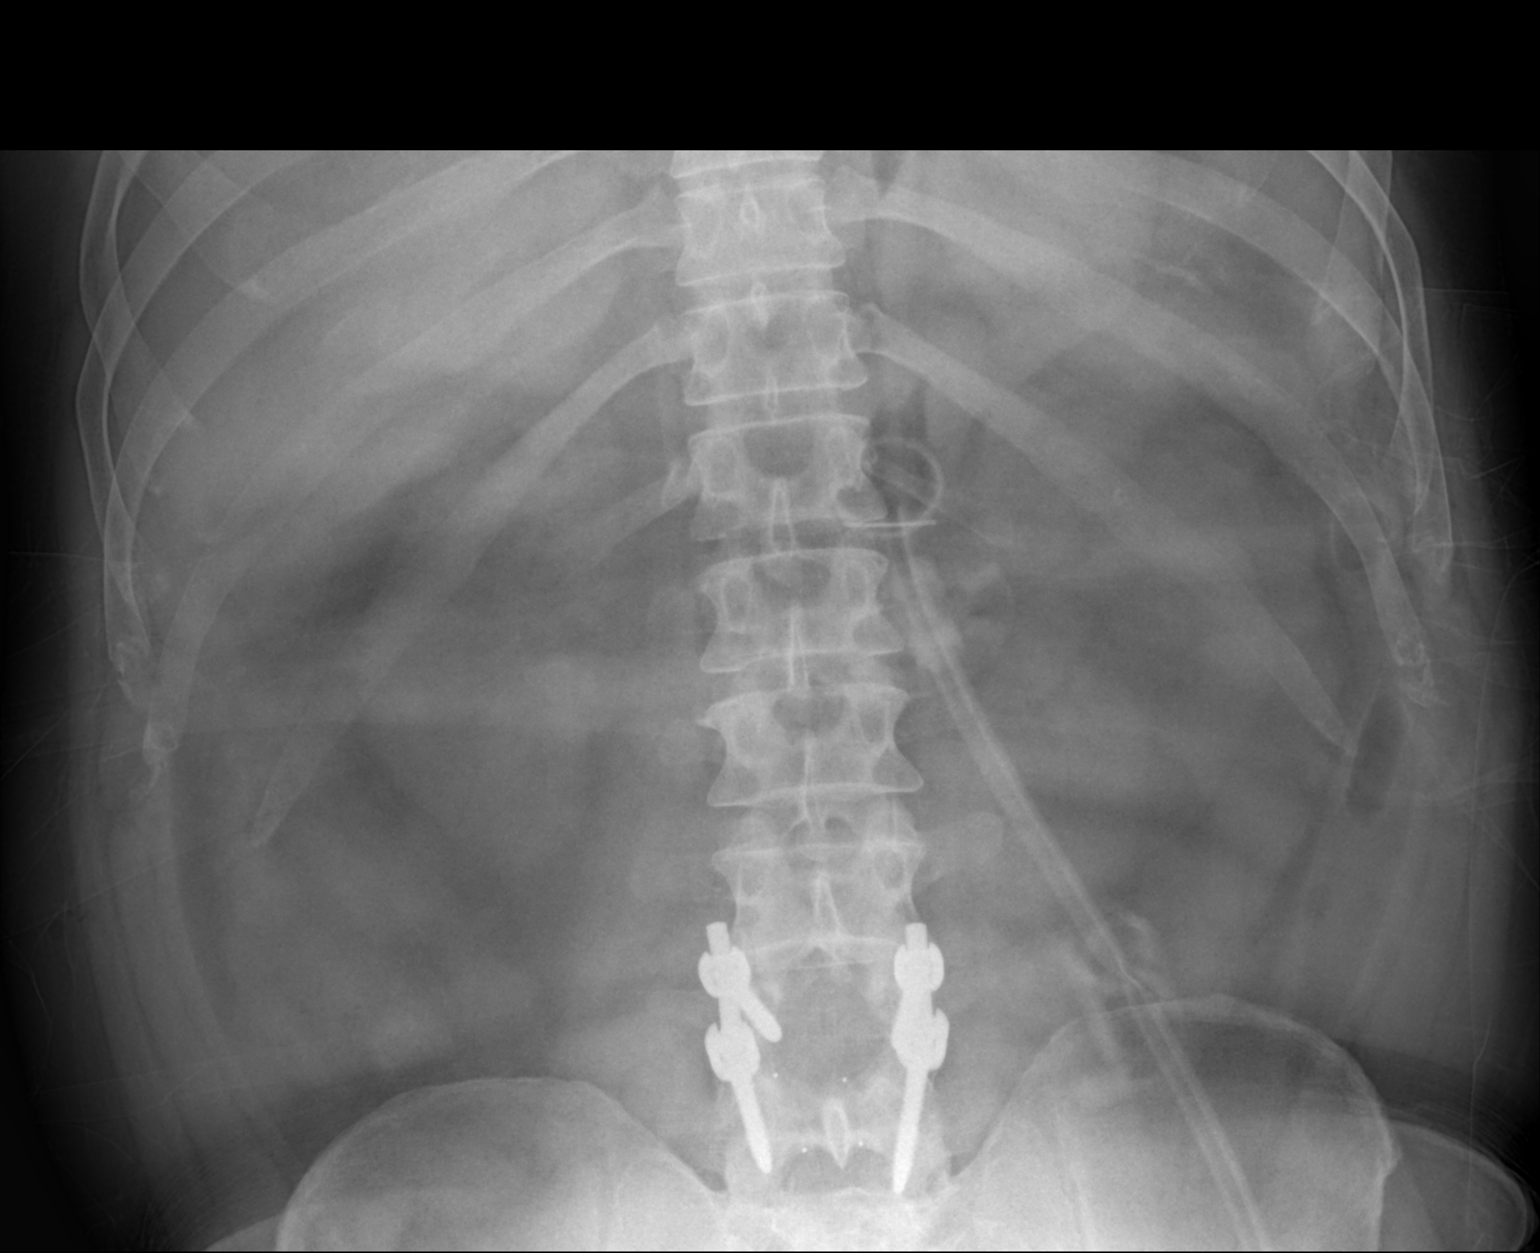

[2 of 2 positions shown; findings below may reference images not displayed]

FINDINGS: Within the left upper quadrant of the abdomen and there
is a gastrostomy tube.

The bowel gas pattern appears nonobstructed.

No dilated loops of small bowel or fluid levels noted.

Some colonic gas is noted.

Prior lower lumbar spine laminectomy and fusion noted.
IMPRESSION: 1.  Nonobstructive bowel gas pattern.

## 2013-07-31 ENCOUNTER — Encounter (HOSPITAL_COMMUNITY): Payer: Self-pay | Admitting: Emergency Medicine

## 2013-07-31 ENCOUNTER — Emergency Department (HOSPITAL_COMMUNITY)
Admission: EM | Admit: 2013-07-31 | Discharge: 2013-08-01 | Disposition: A | Discharge status: Home / Self Care | Payer: 59 | Attending: Emergency Medicine | Admitting: Emergency Medicine

## 2013-07-31 ENCOUNTER — Emergency Department (HOSPITAL_COMMUNITY): Payer: 59

## 2013-07-31 DIAGNOSIS — Z79899 Other long term (current) drug therapy: Secondary | ICD-10-CM | POA: Insufficient documentation

## 2013-07-31 DIAGNOSIS — Z8739 Personal history of other diseases of the musculoskeletal system and connective tissue: Secondary | ICD-10-CM | POA: Insufficient documentation

## 2013-07-31 DIAGNOSIS — H547 Unspecified visual loss: Secondary | ICD-10-CM | POA: Insufficient documentation

## 2013-07-31 DIAGNOSIS — Z85819 Personal history of malignant neoplasm of unspecified site of lip, oral cavity, and pharynx: Secondary | ICD-10-CM | POA: Insufficient documentation

## 2013-07-31 DIAGNOSIS — S02109A Fracture of base of skull, unspecified side, initial encounter for closed fracture: Secondary | ICD-10-CM | POA: Insufficient documentation

## 2013-07-31 DIAGNOSIS — R5383 Other fatigue: Secondary | ICD-10-CM

## 2013-07-31 DIAGNOSIS — S0231XA Fracture of orbital floor, right side, initial encounter for closed fracture: Secondary | ICD-10-CM

## 2013-07-31 DIAGNOSIS — Z862 Personal history of diseases of the blood and blood-forming organs and certain disorders involving the immune mechanism: Secondary | ICD-10-CM | POA: Insufficient documentation

## 2013-07-31 DIAGNOSIS — F988 Other specified behavioral and emotional disorders with onset usually occurring in childhood and adolescence: Secondary | ICD-10-CM | POA: Insufficient documentation

## 2013-07-31 DIAGNOSIS — H538 Other visual disturbances: Secondary | ICD-10-CM | POA: Insufficient documentation

## 2013-07-31 DIAGNOSIS — S0230XA Fracture of orbital floor, unspecified side, initial encounter for closed fracture: Secondary | ICD-10-CM | POA: Insufficient documentation

## 2013-07-31 DIAGNOSIS — Z923 Personal history of irradiation: Secondary | ICD-10-CM | POA: Insufficient documentation

## 2013-07-31 DIAGNOSIS — H5461 Unqualified visual loss, right eye, normal vision left eye: Secondary | ICD-10-CM

## 2013-07-31 DIAGNOSIS — S02401A Maxillary fracture, unspecified, initial encounter for closed fracture: Secondary | ICD-10-CM

## 2013-07-31 DIAGNOSIS — J45909 Unspecified asthma, uncomplicated: Secondary | ICD-10-CM | POA: Insufficient documentation

## 2013-07-31 DIAGNOSIS — F329 Major depressive disorder, single episode, unspecified: Secondary | ICD-10-CM | POA: Insufficient documentation

## 2013-07-31 DIAGNOSIS — R5381 Other malaise: Secondary | ICD-10-CM | POA: Insufficient documentation

## 2013-07-31 DIAGNOSIS — F3289 Other specified depressive episodes: Secondary | ICD-10-CM | POA: Insufficient documentation

## 2013-07-31 LAB — CBC
HEMATOCRIT: 39.5 % (ref 39.0–52.0)
HEMOGLOBIN: 14 g/dL (ref 13.0–17.0)
MCH: 31.2 pg (ref 26.0–34.0)
MCHC: 35.4 g/dL (ref 30.0–36.0)
MCV: 88 fL (ref 78.0–100.0)
Platelets: 187 10*3/uL (ref 150–400)
RBC: 4.49 MIL/uL (ref 4.22–5.81)
RDW: 12.8 % (ref 11.5–15.5)
WBC: 6.5 10*3/uL (ref 4.0–10.5)

## 2013-07-31 LAB — COMPREHENSIVE METABOLIC PANEL
ALK PHOS: 56 U/L (ref 39–117)
ALT: 33 U/L (ref 0–53)
AST: 22 U/L (ref 0–37)
Albumin: 4.1 g/dL (ref 3.5–5.2)
Anion gap: 14 (ref 5–15)
BILIRUBIN TOTAL: 0.4 mg/dL (ref 0.3–1.2)
BUN: 11 mg/dL (ref 6–23)
CHLORIDE: 105 meq/L (ref 96–112)
CO2: 23 mEq/L (ref 19–32)
Calcium: 9.4 mg/dL (ref 8.4–10.5)
Creatinine, Ser: 1.04 mg/dL (ref 0.50–1.35)
GFR calc Af Amer: >90 mL/min (ref >90)
GFR calc non Af Amer: 84 mL/min — ABNORMAL LOW (ref >90)
Glucose, Bld: 103 mg/dL — ABNORMAL HIGH (ref 70–99)
Potassium: 3.8 mEq/L (ref 3.7–5.3)
SODIUM: 142 meq/L (ref 137–147)
TOTAL PROTEIN: 7 g/dL (ref 6.0–8.3)

## 2013-07-31 LAB — ETHANOL: Alcohol, Ethyl (B): <11 mg/dL (ref 0–11)

## 2013-07-31 LAB — APTT: aPTT: 26 seconds (ref 24–37)

## 2013-07-31 MED ORDER — SODIUM CHLORIDE 0.9 % IV BOLUS (SEPSIS)
125.0000 mL | Freq: Once | INTRAVENOUS | Status: AC
  Administered 2013-07-31: 125 mL via INTRAVENOUS
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
## 2013-08-01 MED ORDER — METHOCARBAMOL 750 MG PO TABS: 750.0000 mg | ORAL_TABLET | Freq: Four times a day (QID) | ORAL | Status: AC

## 2013-08-01 MED ORDER — KETOROLAC TROMETHAMINE 30 MG/ML IJ SOLN
30.0000 mg | Freq: Once | INTRAMUSCULAR | Status: AC
  Administered 2013-08-01: 30 mg via INTRAVENOUS
  Filled 2013-08-01: qty 1

## 2013-08-01 NOTE — ED Notes (Signed)
C-collar removed by Dr. Sharol Given.

## 2013-08-01 NOTE — Discharge Instructions (Signed)
Assault, General °Assault includes any behavior, whether intentional or reckless, which results in bodily injury to another person and/or damage to property. Included in this would be any behavior, intentional or reckless, that by its nature would be understood (interpreted) by a reasonable person as intent to harm another person or to damage his/her property. Threats may be oral or written. They may be communicated through regular mail, computer, fax, or phone. These threats may be direct or implied. °FORMS OF ASSAULT INCLUDE: °· Physically assaulting a person. This includes physical threats to inflict physical harm as well as: °¨ Slapping. °¨ Hitting. °¨ Poking. °¨ Kicking. °¨ Punching. °¨ Pushing. °· Arson. °· Sabotage. °· Equipment vandalism. °· Damaging or destroying property. °· Throwing or hitting objects. °· Displaying a weapon or an object that appears to be a weapon in a threatening manner. °¨ Carrying a firearm of any kind. °¨ Using a weapon to harm someone. °· Using greater physical size/strength to intimidate another. °¨ Making intimidating or threatening gestures. °¨ Bullying. °¨ Hazing. °· Intimidating, threatening, hostile, or abusive language directed toward another person. °¨ It communicates the intention to engage in violence against that person. And it leads a reasonable person to expect that violent behavior may occur. °· Stalking another person. °IF IT HAPPENS AGAIN: °· Immediately call for emergency help (911 in [place] °· If someone poses clear and immediate danger to you, seek legal authorities to have a protective or restraining order put in place. °· Less threatening assaults can at least be reported to authorities. °STEPS TO TAKE IF A SEXUAL ASSAULT HAS HAPPENED °· Go to an area of safety. This may include a shelter or staying with a friend. Stay away from the area where you have been attacked. A large percentage of sexual assaults are caused by a friend, relative or associate. °· If  medications were given by your caregiver, take them as directed for the full length of time prescribed. °· Only take over-the-counter or prescription medicines for pain, discomfort, or fever as directed by your caregiver. °· If you have come in contact with a sexual disease, find out if you are to be tested again. If your caregiver is concerned about the HIV/AIDS virus, he/she may require you to have continued testing for several months. °· For the protection of your privacy, test results can not be given over the phone. Make sure you receive the results of your test. If your test results are not back during your visit, make an appointment with your caregiver to find out the results. Do not assume everything is normal if you have not heard from your caregiver or the medical facility. It is important for you to follow up on all of your test results. °· File appropriate papers with authorities. This is important in all assaults, even if it has occurred in a family or by a friend. °SEEK MEDICAL CARE IF: °· You have new problems because of your injuries. °· You have problems that may be because of the medicine you are taking, such as: °¨ Rash. °¨ Itching. °¨ Swelling. °¨ Trouble breathing. °· You develop belly (abdominal) pain, feel sick to your stomach (nausea) or are vomiting. °· You begin to run a temperature. °· You need supportive care or referral to a rape crisis center. These are centers with trained personnel who can help you get through this ordeal. °SEEK IMMEDIATE MEDICAL CARE IF: °· You are afraid of being threatened, beaten, or abused. In [place] call 911. °· You   receive new injuries related to abuse.  You develop severe pain in any area injured in the assault or have any change in your condition that concerns you.  You faint or lose consciousness.  You develop chest pain or shortness of breath. Document Released: 12/22/2004 Document Revised: 03/16/2011 Document Reviewed: 08/10/2007 [facility] Patient  Information 2015 [place], [place]. This information is not intended to replace advice given to you by your health care provider. Make sure you discuss any questions you have with your health care provider.  Facial Fracture A facial fracture is a break in one of the bones of your face. HOME CARE INSTRUCTIONS   Protect the injured part of your face until it is healed.  Do not participate in activities which give chance for re-injury until your doctor approves.  Gently wash and dry your face.  Wear head and facial protection while riding a bicycle, motorcycle, or snowmobile. SEEK MEDICAL CARE IF:   An oral temperature above 102 F (38.9 C) develops.  You have severe headaches or notice changes in your vision.  You have new numbness or tingling in your face.  You develop nausea (feeling sick to your stomach), vomiting or a stiff neck. SEEK IMMEDIATE MEDICAL CARE IF:   You develop difficulty seeing or experience double vision.  You become dizzy, lightheaded, or faint.  You develop trouble speaking, breathing, or swallowing.  You have a watery discharge from your nose or ear. MAKE SURE YOU:   Understand these instructions.  Will watch your condition.  Will get help right away if you are not doing well or get worse. Document Released: 12/22/2004 Document Revised: 03/16/2011 Document Reviewed: 08/11/2007 [facility] Patient Information 2015 [place], [place]. This information is not intended to replace advice given to you by your health care provider. Make sure you discuss any questions you have with your health care provider.  Blurred Vision You have been seen today complaining of blurred vision. This means you have a loss of ability to see small details.  CAUSES  Blurred vision can be a symptom of underlying eye problems, such as:  Aging of the eye (presbyopia).  Glaucoma.  Cataracts.  Eye infection.  Eye-related migraine.  Diabetes mellitus.  Fatigue.  Migraine  headaches.  High blood pressure.  Breakdown of the back of the eye (macular degeneration).  Problems caused by some medications. The most common cause of blurred vision is the need for eyeglasses or a new prescription. Today in the emergency department, no cause for your blurred vision can be found. SYMPTOMS  Blurred vision is the loss of visual sharpness and detail (acuity). DIAGNOSIS  Should blurred vision continue, you should see your caregiver. If your caregiver is your primary care physician, he or she may choose to refer you to another specialist.  TREATMENT  Do not ignore your blurred vision. Make sure to have it checked out to see if further treatment or referral is necessary. SEEK MEDICAL CARE IF:  You are unable to get into a specialist so we can help you with a referral. SEEK IMMEDIATE MEDICAL CARE IF: You have severe eye pain, severe headache, or sudden loss of vision. MAKE SURE YOU:   Understand these instructions. Orbital Floor Fracture, Blowout The eye sits in the bony structure of the skull called the orbit. The upper and outside walls of the orbit are very thick and strong. These walls protect the eye if the head is struck from the top or side of the eye. However, the inside wall near  the nose and the orbit floor are very thin and weak. The bony floor of the orbit also acts as the roof of the air-filled space (sinus) below the orbit. If the eye receives a direct blow from the front, all the tissues around the eye are briefly pressed together. This makes the orbital wall pressure very high. Since the weakest walls tend to give way first, the inside wall or the orbit floor may break. If the floor fractures, the tissues around the eye, including the muscle that is used to make the eye look down, may become trapped within the fracture as the floor of the orbit "blows out" into the sinus below.  CAUSES  Orbital floor fractures are caused by direct (blunt) trauma to the region of  the eye. SYMPTOMS  Assuming that there has been no injury to the eye itself, symptoms can include: Puffiness (swelling) and bruising around the eye area (black eye). A gurgling sound when pressure is placed on the eye area. This sound comes from air that has escaped from the sinus into the space around the eye (orbital emphysema). Seeing two of everything - one object being higher than the other (vertical diplopia). This is the result of the muscle that moves the eye down being trapped within the fracture. Since it cannot relax, the eye is being held in a downward position relative to the other eye and cannot look up. Vertical diplopia from an orbital floor fracture is worse when looking up. Pain around the eye when looking up. One eye looks sunken compared to the other eye (enophthalmos). Numbness of the cheek and upper gum on the same side of the face with the floor fracture. This is a result of nerve injury to these areas. This nerve runs in a groove along the bone of the orbital floor on its way to the cheek and upper gums. DIAGNOSIS  The diagnosis of an orbital floor fracture is suspected during an eye exam by an ophthalmologist. It is confirmed by X-rays or CT scan of the eye region. TREATMENT  Orbital floor fractures are not usually treated until all of the swelling around the eye has gone away. This may take 1 or 2 weeks. Once the swelling has gone down, an ophthalmologist will see if if the muscle below the eye is still trapped within the fracture. If there is no sign of a trapped muscle or vertical diplopia, treatment is not necessary. If there is double vision only when looking up, a decision may be made to not do anything since most people do not spend a lot of time looking up. This may depend on the person's profession. For instance, a Development worker, community or electrician may spend a large part of their day looking up and would therefore need treatment. If there is persistent vertical double vision  even when looking straight ahead, the ophthalmologist may try to free the muscle in the office. If this is unsuccessful, surgery is often needed. SEEK IMMEDIATE MEDICAL CARE IF:  You have had a blow to the region of your eyes and have: A drop in vision in either eye. Swelling and bruising around either eye. One eye seems to be "sunken" compared to the other. Visual Disturbances You have had a disturbance in your vision. This may be caused by various conditions, such as: Migraines. Migraine headaches are often preceded by a disturbance in vision. Blind spots or light flashes are followed by a headache. This type of visual disturbance is temporary. It does not  damage the eye. Glaucoma. This is caused by increased pressure in the eye. Symptoms include haziness, blurred vision, or seeing rainbow colored circles when looking at bright lights. Partial or complete visual loss can occur. You may or may not experience eye pain. Visual loss may be gradual or sudden and is irreversible. Glaucoma is the leading cause of blindness. Retina problems. Vision will be reduced if the retina becomes detached or if there is a circulation problem as with diabetes, high blood pressure, or a mini-stroke. Symptoms include seeing "floaters," flashes of light, or shadows, as if a curtain has fallen over your eye. Optic nerve problems. The main nerve in your eye can be damaged by redness, soreness, and swelling (inflammation), poor circulation, drugs, and toxins. It is very important to have a complete exam done by a specialist to determine the exact cause of your eye problem. The specialist may recommend medicines or surgery, depending on the cause of the problem. This can help prevent further loss of vision or reduce the risk of having a stroke. Contact the caregiver to whom you have been referred and arrange for follow-up care right away. SEEK IMMEDIATE MEDICAL CARE IF:  Your vision gets worse. You develop severe  headaches. You have any weakness or numbness in the face, arms, or legs. You have any trouble speaking or walking. Document Released: 01/30/2004 Document Revised: 03/16/2011 Document Reviewed: 05/22/2009 [facility] Patient Information 2015 [place], [place]. This information is not intended to replace advice given to you by your health care provider. Make sure you discuss any questions you have with your health care provider.  You see two of everything with both eyes open when looking in any direction. The two images get further apart when looking in a certain direction - especially up. You have numbness of the cheek and upper gums on the side of the injury. You develop an unexplained oral temperature over 102 F (38.9 C), or as your caregiver suggests. Document Released: 06/17/2000 Document Revised: 03/16/2011 Document Reviewed: 02/23/2013 [facility] Patient Information 2015 [place], [place]. This information is not intended to replace advice given to you by your health care provider. Make sure you discuss any questions you have with your health care provider.   Will watch your condition.  Will get help right away if you are not doing well or get worse. Document Released: 12/25/2002 Document Revised: 03/16/2011 Document Reviewed: 07/27/2007 [facility] Patient Information 2015 [place], [place]. This information is not intended to replace advice given to you by your health care provider. Make sure you discuss any questions you have with your health care provider.

## 2013-08-01 NOTE — ED Notes (Signed)
Pt A&OX4, ambulatory at d/c with slow steady gait but wheeled out of ED via wheelchair by this RN and assisted into personal vehicle. NAD.
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
## 2013-10-09 ENCOUNTER — Other Ambulatory Visit (HOSPITAL_COMMUNITY): Payer: Self-pay | Admitting: Otolaryngology

## 2013-10-09 DIAGNOSIS — C770 Secondary and unspecified malignant neoplasm of lymph nodes of head, face and neck: Secondary | ICD-10-CM

## 2013-10-10 ENCOUNTER — Ambulatory Visit (HOSPITAL_COMMUNITY): Payer: 59

## 2013-10-10 ENCOUNTER — Other Ambulatory Visit: Payer: Self-pay | Admitting: Otolaryngology

## 2013-10-16 ENCOUNTER — Other Ambulatory Visit: Payer: Self-pay | Admitting: Otolaryngology

## 2013-10-16 DIAGNOSIS — C770 Secondary and unspecified malignant neoplasm of lymph nodes of head, face and neck: Secondary | ICD-10-CM

## 2013-10-16 DIAGNOSIS — Z85819 Personal history of malignant neoplasm of unspecified site of lip, oral cavity, and pharynx: Secondary | ICD-10-CM

## 2013-10-20 ENCOUNTER — Inpatient Hospital Stay: Admission: RE | Admit: 2013-10-20 | Payer: Self-pay | Source: Ambulatory Visit

## 2016-01-22 ENCOUNTER — Other Ambulatory Visit: Payer: Self-pay | Admitting: Nurse Practitioner

## 2023-03-25 ENCOUNTER — Encounter (HOSPITAL_BASED_OUTPATIENT_CLINIC_OR_DEPARTMENT_OTHER): Payer: Self-pay | Admitting: Emergency Medicine

## 2023-03-25 ENCOUNTER — Other Ambulatory Visit (HOSPITAL_BASED_OUTPATIENT_CLINIC_OR_DEPARTMENT_OTHER): Payer: Self-pay

## 2023-03-25 ENCOUNTER — Other Ambulatory Visit: Payer: Self-pay

## 2023-03-25 ENCOUNTER — Emergency Department (HOSPITAL_BASED_OUTPATIENT_CLINIC_OR_DEPARTMENT_OTHER)
Admission: EM | Admit: 2023-03-25 | Discharge: 2023-03-25 | Disposition: A | Discharge status: Home / Self Care | Attending: Emergency Medicine | Admitting: Emergency Medicine

## 2023-03-25 DIAGNOSIS — E876 Hypokalemia: Secondary | ICD-10-CM | POA: Insufficient documentation

## 2023-03-25 DIAGNOSIS — E860 Dehydration: Secondary | ICD-10-CM | POA: Insufficient documentation

## 2023-03-25 DIAGNOSIS — R197 Diarrhea, unspecified: Secondary | ICD-10-CM | POA: Insufficient documentation

## 2023-03-25 DIAGNOSIS — R112 Nausea with vomiting, unspecified: Secondary | ICD-10-CM | POA: Insufficient documentation

## 2023-03-25 LAB — COMPREHENSIVE METABOLIC PANEL
ALT: 45 U/L — ABNORMAL HIGH (ref 0–44)
AST: 37 U/L (ref 15–41)
Albumin: 4.5 g/dL (ref 3.5–5.0)
Alkaline Phosphatase: 59 U/L (ref 38–126)
Anion gap: 16 — ABNORMAL HIGH (ref 5–15)
BUN: 24 mg/dL — ABNORMAL HIGH (ref 6–20)
CO2: 22 mmol/L (ref 22–32)
Calcium: 8.7 mg/dL — ABNORMAL LOW (ref 8.9–10.3)
Chloride: 95 mmol/L — ABNORMAL LOW (ref 98–111)
Creatinine, Ser: 1.16 mg/dL (ref 0.61–1.24)
GFR, Estimated: >60 mL/min (ref >60)
Glucose, Bld: 129 mg/dL — ABNORMAL HIGH (ref 70–99)
Potassium: 2.7 mmol/L — CL (ref 3.5–5.1)
Sodium: 133 mmol/L — ABNORMAL LOW (ref 135–145)
Total Bilirubin: 1.2 mg/dL (ref 0.0–1.2)
Total Protein: 7.9 g/dL (ref 6.5–8.1)

## 2023-03-25 LAB — BASIC METABOLIC PANEL
Anion gap: 9 (ref 5–15)
BUN: 18 mg/dL (ref 6–20)
CO2: 25 mmol/L (ref 22–32)
Calcium: 8 mg/dL — ABNORMAL LOW (ref 8.9–10.3)
Chloride: 101 mmol/L (ref 98–111)
Creatinine, Ser: 0.98 mg/dL (ref 0.61–1.24)
GFR, Estimated: >60 mL/min (ref >60)
Glucose, Bld: 105 mg/dL — ABNORMAL HIGH (ref 70–99)
Potassium: 3.6 mmol/L (ref 3.5–5.1)
Sodium: 135 mmol/L (ref 135–145)

## 2023-03-25 LAB — URINALYSIS, ROUTINE W REFLEX MICROSCOPIC
Glucose, UA: NEGATIVE mg/dL
Hgb urine dipstick: NEGATIVE
Ketones, ur: 80 mg/dL — AB
Leukocytes,Ua: NEGATIVE
Nitrite: NEGATIVE
Protein, ur: 30 mg/dL — AB
Specific Gravity, Urine: >=1.03 (ref 1.005–1.030)
pH: 6 (ref 5.0–8.0)

## 2023-03-25 LAB — CBC
HCT: 45.3 % (ref 39.0–52.0)
Hemoglobin: 16.6 g/dL (ref 13.0–17.0)
MCH: 31 pg (ref 26.0–34.0)
MCHC: 36.6 g/dL — ABNORMAL HIGH (ref 30.0–36.0)
MCV: 84.7 fL (ref 80.0–100.0)
Platelets: 247 10*3/uL (ref 150–400)
RBC: 5.35 MIL/uL (ref 4.22–5.81)
RDW: 12.5 % (ref 11.5–15.5)
WBC: 6.4 10*3/uL (ref 4.0–10.5)
nRBC: 0 % (ref 0.0–0.2)

## 2023-03-25 LAB — URINALYSIS, MICROSCOPIC (REFLEX)
RBC / HPF: NONE SEEN RBC/hpf (ref 0–5)
WBC, UA: NONE SEEN WBC/hpf (ref 0–5)

## 2023-03-25 LAB — RESP PANEL BY RT-PCR (RSV, FLU A&B, COVID)  RVPGX2
Influenza A by PCR: NEGATIVE
Influenza B by PCR: NEGATIVE
Resp Syncytial Virus by PCR: NEGATIVE
SARS Coronavirus 2 by RT PCR: NEGATIVE

## 2023-03-25 LAB — LIPASE, BLOOD: Lipase: 32 U/L (ref 11–51)

## 2023-03-25 LAB — TROPONIN I (HIGH SENSITIVITY): Troponin I (High Sensitivity): 4 ng/L (ref <18)

## 2023-03-25 MED ORDER — ONDANSETRON HCL 4 MG PO TABS: 4.0000 mg | ORAL_TABLET | Freq: Four times a day (QID) | ORAL | 0 refills | Status: AC | PRN

## 2023-03-25 MED ORDER — SODIUM CHLORIDE 0.9 % IV BOLUS
1000.0000 mL | Freq: Once | INTRAVENOUS | Status: AC
  Administered 2023-03-25: 1000 mL via INTRAVENOUS

## 2023-03-25 MED ORDER — ONDANSETRON HCL 4 MG PO TABS
4.0000 mg | ORAL_TABLET | Freq: Four times a day (QID) | ORAL | 0 refills | Status: DC | PRN
  Filled 2023-03-25: qty 30, 8d supply, fill #0

## 2023-03-25 MED ORDER — LACTATED RINGERS IV BOLUS
1000.0000 mL | Freq: Once | INTRAVENOUS | Status: AC
  Administered 2023-03-25: 1000 mL via INTRAVENOUS

## 2023-03-25 MED ORDER — POTASSIUM CHLORIDE 10 MEQ/100ML IV SOLN
10.0000 meq | INTRAVENOUS | Status: AC
  Administered 2023-03-25 (×4): 10 meq via INTRAVENOUS
  Filled 2023-03-25 (×3): qty 100

## 2023-03-25 MED ORDER — POTASSIUM CHLORIDE CRYS ER 20 MEQ PO TBCR
40.0000 meq | EXTENDED_RELEASE_TABLET | Freq: Once | ORAL | Status: AC
  Administered 2023-03-25: 40 meq via ORAL
  Filled 2023-03-25: qty 2

## 2023-03-25 MED ORDER — ONDANSETRON HCL 4 MG/2ML IJ SOLN
4.0000 mg | Freq: Once | INTRAMUSCULAR | Status: AC | PRN
  Administered 2023-03-25: 4 mg via INTRAVENOUS
  Filled 2023-03-25: qty 2

## 2023-03-25 MED ORDER — FAMOTIDINE IN NACL 20-0.9 MG/50ML-% IV SOLN
20.0000 mg | Freq: Once | INTRAVENOUS | Status: AC
  Administered 2023-03-25: 20 mg via INTRAVENOUS
  Filled 2023-03-25: qty 50

## 2023-03-25 NOTE — ED Provider Notes (Signed)
 Care of patient received from prior provider at 5:46 PM, please see their note for complete H/P and care plan.  Received handoff per ED course.  Clinical Course as of 03/25/23 1746  Thu Mar 25, 2023  1529 Stable HO MP 19 YOM with a chief complaint of diarheal illness.  Severely dehydrated s/p 3L IVF and K replacement. Supportive  care.  [CC]  1559 Adding on troponin due to heart score 3 and family history of heart disease. Chest pain resolved at this time.  Likely just muscle spasm per his history and low potassium. Repeating his BMP after fluid resuscitation and trialing p.o. intake at this time. [CC]    Clinical Course User Index [CC] [name], MD    Reassessment: BMP improved, troponin negative. Recommended follow-up with PCP/cardiologist given his family history of heart disease. He is ambulatory tolerating p.o. intake after medications administered he feels comfortable with outpatient care and management at this time.  Disposition:  I have considered need for hospitalization, however, considering all of the above, I believe this patient is stable for discharge at this time.  Patient/family educated about specific return precautions for given chief complaint and symptoms.  Patient/family educated about follow-up with PCP.     Patient/family expressed understanding of return precautions and need for follow-up. Patient spoken to regarding all imaging and laboratory results and appropriate follow up for these results. All education provided in verbal form with additional information in written form. Time was allowed for answering of patient questions. Patient discharged.    Emergency Department Medication Summary:   Medications  ondansetron [facility]) injection 4 mg (4 mg Intravenous Given 03/25/23 1034)  sodium chloride 0.9 % bolus 1,000 mL (0 mLs Intravenous Stopped 03/25/23 1132)  potassium chloride 10 mEq in 100 mL IVPB (10 mEq Intravenous New Bag/Given 03/25/23 1514)   potassium chloride SA (KLOR-CON M) CR tablet 40 mEq (40 mEq Oral Given 03/25/23 1129)  famotidine (PEPCID) IVPB 20 mg premix (0 mg Intravenous Stopped 03/25/23 1201)  sodium chloride 0.9 % bolus 1,000 mL (0 mLs Intravenous Stopped 03/25/23 1414)  lactated ringers bolus 1,000 mL (1,000 mLs Intravenous New Bag/Given 03/25/23 1414)           [name], MD 03/25/23 1746

## 2023-03-25 NOTE — ED Notes (Signed)
 Pt alert and oriented X 4 at the time of discharge. RR even and unlabored. No acute distress noted. Pt verbalized understanding of discharge instructions as discussed. Pt ambulatory to lobby at time of discharge.

## 2023-03-25 NOTE — ED Notes (Signed)
Pt given PO ginger ale

## 2023-03-25 NOTE — ED Triage Notes (Signed)
 N/V/D since Sunday.  No known fever.

## 2023-03-25 NOTE — ED Provider Notes (Signed)
 [place] [facility] Provider Note   CSN: [id number] Arrival date & time: 03/25/23  2536     History  Chief Complaint  Patient presents with   Emesis   Diarrhea    Kyle Hendrix is a 58 y.o. male.  HPI Patient reports he has been sick with a vomiting and diarrheal illness for 5 days.  Symptoms started on Sunday with a lot of diarrhea then followed by a number of episodes of vomiting.  Patient did try to go to work on Monday.  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  He reports he continues to be sick and started having vomiting and diarrhea again.  Since then he has had multiple episodes of diarrhea which have not abated.  Brown in nature nonbloody.  Some generalized abdominal cramping but not severe abdominal pain.  Vomiting continues to come in waves.  Patient reports he is starting to get dizzy and lightheaded with standing.  He reports has had a 20 pound water weight loss over the past 5 days.  No cough no shortness of breath no upper respiratory symptoms.  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  Also history of colon polyps but not colon cancer.  No recent antibiotic use.  No over-the-counter medications are helping the symptoms.  Patient does not have sick contacts that he knows of.  Family members are not sick.    Home Medications Prior to Admission medications   Medication Sig Start Date End Date Taking? Authorizing Provider  amphetamine-dextroamphetamine (ADDERALL) 30 MG tablet Take 30 mg by mouth daily.    [provider]  methocarbamol (ROBAXIN-750) 750 MG tablet Take 1 tablet (750 mg total) by mouth 4 (four) times daily. 08/01/13   Marisa Severin, MD  sertraline (ZOLOFT) 100 MG tablet Take 150 mg by mouth daily.     [provider]      Allergies    Contrast media [iodinated contrast media], Iodine, Phenergan [promethazine hcl], Shellfish allergy, Betadine [povidone iodine], Avelox  [moxifloxacin hcl in nacl], Fentanyl, Morphine and codeine, Novacort, Prednisone, and Ultram [tramadol hcl]    Review of Systems   Review of Systems  Physical Exam Updated Vital Signs BP 106/78   Pulse 94   Temp 98.3 F (36.8 C) (Oral)   Resp 17   Ht 5\' 9"  (1.753 m)   Wt 102.1 kg   SpO2 94%   BMI 33.23 kg/m  Physical Exam Constitutional:      Comments: Patient is clear mental status.  No respiratory distress.  Moderately ill in appearance.  HENT:     Mouth/Throat:     Mouth: Mucous membranes are dry.     Pharynx: Oropharynx is clear.  Eyes:     Extraocular Movements: Extraocular movements intact.     Pupils: Pupils are equal, round, and reactive to light.  Cardiovascular:     Rate and Rhythm: Normal rate and regular rhythm.  Pulmonary:     Effort: Pulmonary effort is normal.     Breath sounds: Normal breath sounds.  Abdominal:     Comments: Abdomen is soft.  Moderate central tenderness to palpation.  Musculoskeletal:        General: No swelling or tenderness. Normal range of motion.     Cervical back: Neck supple.     Right lower leg: No edema.     Left lower leg: No edema.  Skin:    General: Skin is  warm and dry.     Coloration: Skin is pale.  Neurological:     General: No focal deficit present.     Mental Status: He is oriented to person, place, and time.     Motor: No weakness.     Coordination: Coordination normal.  Psychiatric:        Mood and Affect: Mood normal.     ED Results / Procedures / Treatments   Labs (all labs ordered are listed, but only abnormal results are displayed) Labs Reviewed  COMPREHENSIVE METABOLIC PANEL - Abnormal; Notable for the following components:      Result Value   Sodium 133 (*)    Potassium 2.7 (*)    Chloride 95 (*)    Glucose, Bld 129 (*)    BUN 24 (*)    Calcium 8.7 (*)    ALT 45 (*)    Anion gap 16 (*)    All other components within normal limits  CBC - Abnormal; Notable for the following components:   MCHC  36.6 (*)    All other components within normal limits  URINALYSIS, ROUTINE W REFLEX MICROSCOPIC - Abnormal; Notable for the following components:   Bilirubin Urine SMALL (*)    Ketones, ur 80 (*)    Protein, ur 30 (*)    All other components within normal limits  URINALYSIS, MICROSCOPIC (REFLEX) - Abnormal; Notable for the following components:   Bacteria, UA RARE (*)    All other components within normal limits  RESP PANEL BY RT-PCR (RSV, FLU A&B, COVID)  RVPGX2  LIPASE, BLOOD    EKG EKG Interpretation Date/Time:  Thursday March 25 2023 15:20:31 EDT Ventricular Rate:  86 PR Interval:  154 QRS Duration:  116 QT Interval:  365 QTC Calculation: 437 R Axis:   -62  Text Interpretation: Sinus rhythm Left anterior fascicular block Consider anterior infarct Artifact in lead(s) V1 Confirmed by [name] [phone]) on 03/25/2023 3:30:17 PM  Radiology No results found.  Procedures Procedures    Medications Ordered in ED Medications  potassium chloride 10 mEq in 100 mL IVPB (10 mEq Intravenous New Bag/Given 03/25/23 1514)  ondansetron (ZOFRAN) injection 4 mg (4 mg Intravenous Given 03/25/23 1034)  sodium chloride 0.9 % bolus 1,000 mL (0 mLs Intravenous Stopped 03/25/23 1132)  potassium chloride SA (KLOR-CON M) CR tablet 40 mEq (40 mEq Oral Given 03/25/23 1129)  famotidine (PEPCID) IVPB 20 mg premix (0 mg Intravenous Stopped 03/25/23 1201)  sodium chloride 0.9 % bolus 1,000 mL (0 mLs Intravenous Stopped 03/25/23 1414)  lactated ringers bolus 1,000 mL (1,000 mLs Intravenous New Bag/Given 03/25/23 1414)    ED Course/ Medical Decision Making/ A&P Clinical Course as of 03/25/23 1536  Thu Mar 25, 2023  1529 Stable HO MP 62 YOM with a chief complaint of diarheal illness.  Severely dehydrated s/p 3L IVF and K replacement. Supportive  care.  [CC]    Clinical Course User Index [CC] [name], MD                                 Medical Decision Making Amount and/or Complexity  of Data Reviewed Labs: ordered.  Risk Prescription drug management.   Patient has had persistent diarrhea and vomiting for about 5 days.  He reports almost 20 pounds weight loss and lightheadedness and extreme fatigue.  At this time we will proceed with diagnostic evaluation for electrolyte derangement\dehydration\infectious illness.  Patient does  not have fever and has no focal pain.  Heart rate less than 100.  Currently not meeting sepsis criteria.  Will continue to monitor closely.  Will give symptomatic treatment with Zofran and Pepcid.  Potassium 2.7 BUN 24 creatinine 1.16 sodium 133 chloride 95 anion gap 16.  COVID and flu testing negative.  Urinalysis 80 mg/dL ketones 30 mg deciliter protein no signs of infection.  White count 6.4 H&H 16.6 and 45.  Platelets 247.  At this time labs are most suggestive of fluid volume loss and hypokalemia secondary to diarrhea.  Will proceed with aggressive rehydration and potassium replacement.  Patient has had some improvement after hydration.  Patient will be started on oral rehydration.  Potassium continues to infuse.  Will plan for orthostatic vital signs and reassessment for hydration status.  Dr. Doran Durand to assume care.  Plan will be for reassessment for possible discharge if patient shows signs of adequate rehydration and ability for oral intake.  If he continues to have significant fatigue, weakness, hypotension, not tolerating oral intake will require admission for dehydration and hypokalemia.          Final Clinical Impression(s) / ED Diagnoses Final diagnoses:  Dehydration  Nausea vomiting and diarrhea  Hypokalemia    Rx / DC Orders ED Discharge Orders     None         Arby Barrette, MD 03/25/23 1540

## 2023-03-26 ENCOUNTER — Other Ambulatory Visit (HOSPITAL_BASED_OUTPATIENT_CLINIC_OR_DEPARTMENT_OTHER): Payer: Self-pay
